# Patient Record
Sex: Male | Born: 1965 | Hispanic: Yes | Marital: Married
Health system: Southern US, Community
[De-identification: ages and names within clinical notes are randomized; demographics above are authoritative.]

## PROBLEM LIST (undated history)

## (undated) DIAGNOSIS — T884XXA Failed or difficult intubation, initial encounter: Secondary | ICD-10-CM

## (undated) DIAGNOSIS — I10 Essential (primary) hypertension: Secondary | ICD-10-CM

---

## 2015-01-08 ENCOUNTER — Inpatient Hospital Stay (HOSPITAL_COMMUNITY): Payer: Medicaid Other

## 2015-01-08 ENCOUNTER — Encounter (HOSPITAL_COMMUNITY): Payer: Self-pay

## 2015-01-08 ENCOUNTER — Inpatient Hospital Stay (HOSPITAL_COMMUNITY)
Admission: EM | Admit: 2015-01-08 | Discharge: 2015-01-24 | DRG: 246 | Disposition: A | Discharge status: Rehabilitation Facility | Payer: Medicaid Other | Attending: Internal Medicine | Admitting: Internal Medicine

## 2015-01-08 ENCOUNTER — Emergency Department (HOSPITAL_COMMUNITY): Payer: Medicaid Other

## 2015-01-08 ENCOUNTER — Encounter (HOSPITAL_COMMUNITY)
Admission: EM | Disposition: A | Discharge status: Rehabilitation Facility | Payer: Self-pay | Source: Home / Self Care | Attending: Pulmonary Disease

## 2015-01-08 DIAGNOSIS — R1310 Dysphagia, unspecified: Secondary | ICD-10-CM | POA: Diagnosis present

## 2015-01-08 DIAGNOSIS — D649 Anemia, unspecified: Secondary | ICD-10-CM | POA: Diagnosis present

## 2015-01-08 DIAGNOSIS — N189 Chronic kidney disease, unspecified: Secondary | ICD-10-CM | POA: Diagnosis present

## 2015-01-08 DIAGNOSIS — A419 Sepsis, unspecified organism: Secondary | ICD-10-CM | POA: Diagnosis not present

## 2015-01-08 DIAGNOSIS — J80 Acute respiratory distress syndrome: Secondary | ICD-10-CM | POA: Diagnosis not present

## 2015-01-08 DIAGNOSIS — G931 Anoxic brain damage, not elsewhere classified: Secondary | ICD-10-CM

## 2015-01-08 DIAGNOSIS — J9601 Acute respiratory failure with hypoxia: Secondary | ICD-10-CM

## 2015-01-08 DIAGNOSIS — E876 Hypokalemia: Secondary | ICD-10-CM

## 2015-01-08 DIAGNOSIS — J810 Acute pulmonary edema: Secondary | ICD-10-CM | POA: Diagnosis present

## 2015-01-08 DIAGNOSIS — Z4659 Encounter for fitting and adjustment of other gastrointestinal appliance and device: Secondary | ICD-10-CM

## 2015-01-08 DIAGNOSIS — G9340 Encephalopathy, unspecified: Secondary | ICD-10-CM | POA: Diagnosis present

## 2015-01-08 DIAGNOSIS — Z79899 Other long term (current) drug therapy: Secondary | ICD-10-CM

## 2015-01-08 DIAGNOSIS — R1313 Dysphagia, pharyngeal phase: Secondary | ICD-10-CM | POA: Diagnosis not present

## 2015-01-08 DIAGNOSIS — I472 Ventricular tachycardia, unspecified: Secondary | ICD-10-CM | POA: Insufficient documentation

## 2015-01-08 DIAGNOSIS — J9690 Respiratory failure, unspecified, unspecified whether with hypoxia or hypercapnia: Secondary | ICD-10-CM

## 2015-01-08 DIAGNOSIS — I129 Hypertensive chronic kidney disease with stage 1 through stage 4 chronic kidney disease, or unspecified chronic kidney disease: Secondary | ICD-10-CM | POA: Diagnosis present

## 2015-01-08 DIAGNOSIS — K7200 Acute and subacute hepatic failure without coma: Secondary | ICD-10-CM | POA: Diagnosis present

## 2015-01-08 DIAGNOSIS — Z8249 Family history of ischemic heart disease and other diseases of the circulatory system: Secondary | ICD-10-CM | POA: Diagnosis not present

## 2015-01-08 DIAGNOSIS — J189 Pneumonia, unspecified organism: Secondary | ICD-10-CM | POA: Diagnosis present

## 2015-01-08 DIAGNOSIS — I462 Cardiac arrest due to underlying cardiac condition: Secondary | ICD-10-CM | POA: Diagnosis present

## 2015-01-08 DIAGNOSIS — R570 Cardiogenic shock: Secondary | ICD-10-CM | POA: Diagnosis present

## 2015-01-08 DIAGNOSIS — N179 Acute kidney failure, unspecified: Secondary | ICD-10-CM | POA: Diagnosis not present

## 2015-01-08 DIAGNOSIS — R6521 Severe sepsis with septic shock: Secondary | ICD-10-CM | POA: Diagnosis not present

## 2015-01-08 DIAGNOSIS — K5900 Constipation, unspecified: Secondary | ICD-10-CM | POA: Diagnosis not present

## 2015-01-08 DIAGNOSIS — J15211 Pneumonia due to Methicillin susceptible Staphylococcus aureus: Secondary | ICD-10-CM | POA: Diagnosis not present

## 2015-01-08 DIAGNOSIS — I96 Gangrene, not elsewhere classified: Secondary | ICD-10-CM | POA: Diagnosis not present

## 2015-01-08 DIAGNOSIS — I5031 Acute diastolic (congestive) heart failure: Secondary | ICD-10-CM | POA: Diagnosis not present

## 2015-01-08 DIAGNOSIS — I469 Cardiac arrest, cause unspecified: Secondary | ICD-10-CM

## 2015-01-08 DIAGNOSIS — I10 Essential (primary) hypertension: Secondary | ICD-10-CM | POA: Diagnosis present

## 2015-01-08 DIAGNOSIS — I2510 Atherosclerotic heart disease of native coronary artery without angina pectoris: Secondary | ICD-10-CM

## 2015-01-08 DIAGNOSIS — Z01818 Encounter for other preprocedural examination: Secondary | ICD-10-CM

## 2015-01-08 DIAGNOSIS — I4901 Ventricular fibrillation: Secondary | ICD-10-CM | POA: Diagnosis not present

## 2015-01-08 DIAGNOSIS — F05 Delirium due to known physiological condition: Secondary | ICD-10-CM | POA: Diagnosis not present

## 2015-01-08 DIAGNOSIS — J1520 Pneumonia due to staphylococcus, unspecified: Secondary | ICD-10-CM | POA: Diagnosis present

## 2015-01-08 DIAGNOSIS — I2111 ST elevation (STEMI) myocardial infarction involving right coronary artery: Secondary | ICD-10-CM

## 2015-01-08 DIAGNOSIS — E870 Hyperosmolality and hypernatremia: Secondary | ICD-10-CM | POA: Diagnosis not present

## 2015-01-08 DIAGNOSIS — D696 Thrombocytopenia, unspecified: Secondary | ICD-10-CM | POA: Diagnosis present

## 2015-01-08 DIAGNOSIS — Z9289 Personal history of other medical treatment: Secondary | ICD-10-CM

## 2015-01-08 DIAGNOSIS — R739 Hyperglycemia, unspecified: Secondary | ICD-10-CM | POA: Diagnosis present

## 2015-01-08 DIAGNOSIS — Z978 Presence of other specified devices: Secondary | ICD-10-CM

## 2015-01-08 DIAGNOSIS — Z7982 Long term (current) use of aspirin: Secondary | ICD-10-CM

## 2015-01-08 HISTORY — DX: Essential (primary) hypertension: I10

## 2015-01-08 HISTORY — DX: Failed or difficult intubation, initial encounter: T88.4XXA

## 2015-01-08 HISTORY — PX: ELECTROPHYSIOLOGIC STUDY: SHX172A

## 2015-01-08 HISTORY — PX: CARDIAC CATHETERIZATION: SHX172

## 2015-01-08 LAB — BASIC METABOLIC PANEL
ANION GAP: 15 (ref 5–15)
ANION GAP: 11 (ref 5–15)
BUN: 16 mg/dL (ref 6–20)
BUN: 19 mg/dL (ref 6–20)
CALCIUM: 7.3 mg/dL — AB (ref 8.9–10.3)
CALCIUM: 7.5 mg/dL — AB (ref 8.9–10.3)
CHLORIDE: 111 mmol/L (ref 101–111)
CO2: 16 mmol/L — ABNORMAL LOW (ref 22–32)
CO2: 16 mmol/L — ABNORMAL LOW (ref 22–32)
CREATININE: 2.14 mg/dL — AB (ref 0.61–1.24)
Chloride: 106 mmol/L (ref 101–111)
Creatinine, Ser: 2.05 mg/dL — ABNORMAL HIGH (ref 0.61–1.24)
GFR calc Af Amer: 39 mL/min — ABNORMAL LOW (ref >60)
GFR calc non Af Amer: 36 mL/min — ABNORMAL LOW (ref >60)
GFR calc non Af Amer: 34 mL/min — ABNORMAL LOW (ref >60)
GFR, EST AFRICAN AMERICAN: 41 mL/min — AB (ref >60)
Glucose, Bld: 339 mg/dL — ABNORMAL HIGH (ref 65–99)
Glucose, Bld: 219 mg/dL — ABNORMAL HIGH (ref 65–99)
POTASSIUM: 4.3 mmol/L (ref 3.5–5.1)
Potassium: 3.8 mmol/L (ref 3.5–5.1)
SODIUM: 138 mmol/L (ref 135–145)
Sodium: 137 mmol/L (ref 135–145)

## 2015-01-08 LAB — COMPREHENSIVE METABOLIC PANEL
ALK PHOS: 70 U/L (ref 38–126)
ALT: 1428 U/L — ABNORMAL HIGH (ref 17–63)
AST: 848 U/L — ABNORMAL HIGH (ref 15–41)
Albumin: 3.8 g/dL (ref 3.5–5.0)
Anion gap: 24 — ABNORMAL HIGH (ref 5–15)
BUN: 15 mg/dL (ref 6–20)
CALCIUM: 8.7 mg/dL — AB (ref 8.9–10.3)
CHLORIDE: 105 mmol/L (ref 101–111)
CO2: 13 mmol/L — ABNORMAL LOW (ref 22–32)
Creatinine, Ser: 2.15 mg/dL — ABNORMAL HIGH (ref 0.61–1.24)
GFR calc Af Amer: 39 mL/min — ABNORMAL LOW (ref >60)
GFR calc non Af Amer: 34 mL/min — ABNORMAL LOW (ref >60)
GLUCOSE: 354 mg/dL — AB (ref 65–99)
POTASSIUM: 2.9 mmol/L — AB (ref 3.5–5.1)
SODIUM: 142 mmol/L (ref 135–145)
Total Bilirubin: 0.6 mg/dL (ref 0.3–1.2)
Total Protein: 6.2 g/dL — ABNORMAL LOW (ref 6.5–8.1)

## 2015-01-08 LAB — CBC
HEMATOCRIT: 43 % (ref 39.0–52.0)
HEMOGLOBIN: 13.7 g/dL (ref 13.0–17.0)
MCH: 28.1 pg (ref 26.0–34.0)
MCHC: 31.9 g/dL (ref 30.0–36.0)
MCV: 88.1 fL (ref 78.0–100.0)
Platelets: 231 10*3/uL (ref 150–400)
RBC: 4.88 MIL/uL (ref 4.22–5.81)
RDW: 13.6 % (ref 11.5–15.5)
WBC: 6.8 10*3/uL (ref 4.0–10.5)

## 2015-01-08 LAB — POCT I-STAT, CHEM 8
BUN: 19 mg/dL (ref 6–20)
BUN: 19 mg/dL (ref 6–20)
CALCIUM ION: 1.06 mmol/L — AB (ref 1.12–1.23)
CHLORIDE: 112 mmol/L — AB (ref 101–111)
Calcium, Ion: 1.1 mmol/L — ABNORMAL LOW (ref 1.12–1.23)
Chloride: 112 mmol/L — ABNORMAL HIGH (ref 101–111)
Creatinine, Ser: 1.7 mg/dL — ABNORMAL HIGH (ref 0.61–1.24)
Creatinine, Ser: 1.7 mg/dL — ABNORMAL HIGH (ref 0.61–1.24)
GLUCOSE: 253 mg/dL — AB (ref 65–99)
Glucose, Bld: 283 mg/dL — ABNORMAL HIGH (ref 65–99)
HCT: 43 % (ref 39.0–52.0)
HEMATOCRIT: 44 % (ref 39.0–52.0)
HEMOGLOBIN: 14.6 g/dL (ref 13.0–17.0)
Hemoglobin: 15 g/dL (ref 13.0–17.0)
Potassium: 4.5 mmol/L (ref 3.5–5.1)
Potassium: 4.5 mmol/L (ref 3.5–5.1)
SODIUM: 143 mmol/L (ref 135–145)
Sodium: 142 mmol/L (ref 135–145)
TCO2: 12 mmol/L (ref 0–100)
TCO2: 12 mmol/L (ref 0–100)

## 2015-01-08 LAB — PROTIME-INR
INR: 1.54 — AB (ref 0.00–1.49)
INR: >10 (ref 0.00–1.49)
PROTHROMBIN TIME: 18.5 s — AB (ref 11.6–15.2)

## 2015-01-08 LAB — APTT: aPTT: 55 seconds — ABNORMAL HIGH (ref 24–37)

## 2015-01-08 LAB — I-STAT TROPONIN, ED: TROPONIN I, POC: 0.42 ng/mL — AB (ref 0.00–0.08)

## 2015-01-08 LAB — GLUCOSE, CAPILLARY: Glucose-Capillary: 300 mg/dL — ABNORMAL HIGH (ref 65–99)

## 2015-01-08 LAB — LACTIC ACID, PLASMA: Lactic Acid, Venous: 9.3 mmol/L (ref 0.5–2.0)

## 2015-01-08 LAB — TROPONIN I: Troponin I: 13.65 ng/mL (ref <0.031)

## 2015-01-08 SURGERY — LEFT HEART CATH AND CORONARY ANGIOGRAPHY
Anesthesia: LOCAL

## 2015-01-08 MED ORDER — POTASSIUM CHLORIDE 20 MEQ/15ML (10%) PO SOLN
40.0000 meq | Freq: Once | ORAL | Status: AC
  Administered 2015-01-08: 40 meq
  Filled 2015-01-08: qty 30

## 2015-01-08 MED ORDER — BIVALIRUDIN 250 MG IV SOLR
0.2500 mg/kg/h | Freq: Once | INTRAVENOUS | Status: AC
  Administered 2015-01-08: 1.75 mg/kg/h via INTRAVENOUS
  Filled 2015-01-08: qty 250

## 2015-01-08 MED ORDER — SODIUM CHLORIDE 0.9 % IV SOLN
1.0000 ug/kg/min | INTRAVENOUS | Status: DC
  Administered 2015-01-08 – 2015-01-09 (×2): 1 ug/kg/min via INTRAVENOUS
  Filled 2015-01-08 (×2): qty 20

## 2015-01-08 MED ORDER — AMIODARONE HCL IN DEXTROSE 360-4.14 MG/200ML-% IV SOLN
60.0000 mg/h | INTRAVENOUS | Status: AC
  Administered 2015-01-08: 60 mg/h via INTRAVENOUS
  Filled 2015-01-08 (×2): qty 200

## 2015-01-08 MED ORDER — ASPIRIN EC 81 MG PO TBEC
81.0000 mg | DELAYED_RELEASE_TABLET | Freq: Every day | ORAL | Status: DC
  Administered 2015-01-09 – 2015-01-10 (×2): 81 mg via ORAL
  Filled 2015-01-08 (×2): qty 1

## 2015-01-08 MED ORDER — TIROFIBAN HCL IV 5 MG/100ML
INTRAVENOUS | Status: DC | PRN
  Administered 2015-01-08: 0.075 ug/kg/min via INTRAVENOUS

## 2015-01-08 MED ORDER — SODIUM CHLORIDE 0.9 % IV SOLN
25.0000 ug/h | INTRAVENOUS | Status: DC
  Administered 2015-01-08: 50 ug/h via INTRAVENOUS
  Administered 2015-01-09: 30 ug/h via INTRAVENOUS
  Administered 2015-01-09: 250 ug/h via INTRAVENOUS
  Administered 2015-01-10: 275 ug/h via INTRAVENOUS
  Filled 2015-01-08 (×5): qty 50

## 2015-01-08 MED ORDER — PROPOFOL 1000 MG/100ML IV EMUL
INTRAVENOUS | Status: AC
  Administered 2015-01-08: 20.83 mg
  Filled 2015-01-08: qty 100

## 2015-01-08 MED ORDER — SODIUM CHLORIDE 0.9 % IJ SOLN: 3.0000 mL | INTRAMUSCULAR | Status: DC | PRN

## 2015-01-08 MED ORDER — SODIUM CHLORIDE 0.9 % IV SOLN
INTRAVENOUS | Status: DC
  Administered 2015-01-08 (×2): via INTRAVENOUS

## 2015-01-08 MED ORDER — TIROFIBAN HCL IV 12.5 MG/250 ML
INTRAVENOUS | Status: AC
  Filled 2015-01-08: qty 250

## 2015-01-08 MED ORDER — METOPROLOL TARTRATE 1 MG/ML IV SOLN
INTRAVENOUS | Status: AC
  Filled 2015-01-08: qty 5

## 2015-01-08 MED ORDER — TICAGRELOR 90 MG PO TABS
90.0000 mg | ORAL_TABLET | Freq: Two times a day (BID) | ORAL | Status: DC
  Administered 2015-01-09 – 2015-01-10 (×3): 90 mg via ORAL
  Filled 2015-01-08 (×4): qty 1

## 2015-01-08 MED ORDER — SODIUM CHLORIDE 0.9 % IV SOLN
1.7500 mg/kg/h | INTRAVENOUS | Status: AC
  Administered 2015-01-08: 1.75 mg/kg/h via INTRAVENOUS

## 2015-01-08 MED ORDER — MIDAZOLAM HCL 5 MG/ML IJ SOLN
1.0000 mg/h | INTRAMUSCULAR | Status: DC
  Administered 2015-01-09 – 2015-01-10 (×4): 6 mg/h via INTRAVENOUS
  Filled 2015-01-08 (×6): qty 10

## 2015-01-08 MED ORDER — CISATRACURIUM BOLUS VIA INFUSION
0.1000 mg/kg | Freq: Once | INTRAVENOUS | Status: AC
  Administered 2015-01-08: 8 mg via INTRAVENOUS
  Filled 2015-01-08: qty 8

## 2015-01-08 MED ORDER — PHENYLEPHRINE HCL 10 MG/ML IJ SOLN
30.0000 ug/min | INTRAVENOUS | Status: DC
  Administered 2015-01-08: 30 ug/min via INTRAVENOUS
  Filled 2015-01-08: qty 4

## 2015-01-08 MED ORDER — IOHEXOL 350 MG/ML SOLN
INTRAVENOUS | Status: DC | PRN
  Administered 2015-01-08: 325 mL via INTRAVENOUS

## 2015-01-08 MED ORDER — SODIUM CHLORIDE 0.9 % IV SOLN
25.0000 ug/h | INTRAVENOUS | Status: DC
  Administered 2015-01-08: 50 ug/h via INTRAVENOUS
  Filled 2015-01-08: qty 50

## 2015-01-08 MED ORDER — NOREPINEPHRINE BITARTRATE 1 MG/ML IV SOLN
0.0000 ug/min | INTRAVENOUS | Status: DC
  Filled 2015-01-08: qty 4

## 2015-01-08 MED ORDER — SODIUM CHLORIDE 0.9 % IV SOLN
1.0000 mg/h | INTRAVENOUS | Status: DC
  Administered 2015-01-08: 3 mg/h via INTRAVENOUS
  Filled 2015-01-08: qty 10

## 2015-01-08 MED ORDER — CANGRELOR BOLUS VIA INFUSION
INTRAVENOUS | Status: DC | PRN
  Administered 2015-01-08: 2400 ug via INTRAVENOUS

## 2015-01-08 MED ORDER — FENTANYL CITRATE (PF) 100 MCG/2ML IJ SOLN: 100.0000 ug | Freq: Once | INTRAMUSCULAR | Status: AC

## 2015-01-08 MED ORDER — CANGRELOR TETRASODIUM 50 MG IV SOLR
INTRAVENOUS | Status: DC | PRN
  Administered 2015-01-08 (×2): 4 via INTRAVENOUS

## 2015-01-08 MED ORDER — SODIUM CHLORIDE 0.9 % IJ SOLN
3.0000 mL | Freq: Two times a day (BID) | INTRAMUSCULAR | Status: DC
  Administered 2015-01-08 – 2015-01-15 (×14): 3 mL via INTRAVENOUS

## 2015-01-08 MED ORDER — FENTANYL BOLUS VIA INFUSION
50.0000 ug | INTRAVENOUS | Status: DC | PRN
  Filled 2015-01-08: qty 50

## 2015-01-08 MED ORDER — METOPROLOL TARTRATE 1 MG/ML IV SOLN
INTRAVENOUS | Status: DC | PRN
  Administered 2015-01-08: 2.5 mg via INTRAVENOUS

## 2015-01-08 MED ORDER — ONDANSETRON HCL 4 MG/2ML IJ SOLN
4.0000 mg | Freq: Four times a day (QID) | INTRAMUSCULAR | Status: DC | PRN
  Administered 2015-01-19: 4 mg via INTRAVENOUS

## 2015-01-08 MED ORDER — LIDOCAINE HCL (PF) 1 % IJ SOLN
INTRAMUSCULAR | Status: AC
  Filled 2015-01-08: qty 30

## 2015-01-08 MED ORDER — ETOMIDATE 2 MG/ML IV SOLN: 0.3000 mg/kg | Freq: Once | INTRAVENOUS | Status: DC

## 2015-01-08 MED ORDER — CANGRELOR TETRASODIUM 50 MG IV SOLR
INTRAVENOUS | Status: AC
  Filled 2015-01-08: qty 50

## 2015-01-08 MED ORDER — PROPOFOL 1000 MG/100ML IV EMUL
5.0000 ug/kg/min | Freq: Once | INTRAVENOUS | Status: AC
  Administered 2015-01-08: 20.833 ug/kg/min via INTRAVENOUS

## 2015-01-08 MED ORDER — BIVALIRUDIN BOLUS VIA INFUSION - CUPID
INTRAVENOUS | Status: DC | PRN
  Administered 2015-01-08: 60 mg via INTRAVENOUS

## 2015-01-08 MED ORDER — SODIUM CHLORIDE 0.9 % IV SOLN
2000.0000 mL | Freq: Once | INTRAVENOUS | Status: AC
Start: 2015-01-08 — End: 2015-01-08

## 2015-01-08 MED ORDER — ETOMIDATE 2 MG/ML IV SOLN
20.0000 mg | Freq: Once | INTRAVENOUS | Status: AC
  Administered 2015-01-08: 20 mg via INTRAVENOUS

## 2015-01-08 MED ORDER — SODIUM CHLORIDE 0.9 % IV SOLN
250.0000 mL | INTRAVENOUS | Status: DC | PRN
  Administered 2015-01-09: 250 mL via INTRAVENOUS

## 2015-01-08 MED ORDER — BIVALIRUDIN 250 MG IV SOLR
250.0000 mg | INTRAVENOUS | Status: DC | PRN
  Administered 2015-01-08: 1.75 mg/kg/h via INTRAVENOUS

## 2015-01-08 MED ORDER — AMIODARONE HCL IN DEXTROSE 360-4.14 MG/200ML-% IV SOLN
60.0000 mg/h | Freq: Once | INTRAVENOUS | Status: AC
  Administered 2015-01-08: 60 mg/h via INTRAVENOUS
  Filled 2015-01-08: qty 200

## 2015-01-08 MED ORDER — ATORVASTATIN CALCIUM 80 MG PO TABS
80.0000 mg | ORAL_TABLET | Freq: Every day | ORAL | Status: DC
  Filled 2015-01-08: qty 1

## 2015-01-08 MED ORDER — ACETAMINOPHEN 325 MG PO TABS
650.0000 mg | ORAL_TABLET | ORAL | Status: DC | PRN
  Administered 2015-01-09 – 2015-01-10 (×3): 650 mg via ORAL
  Filled 2015-01-08 (×3): qty 2

## 2015-01-08 MED ORDER — TIROFIBAN HCL IV 5 MG/100ML
0.0750 ug/kg/min | INTRAVENOUS | Status: DC
  Administered 2015-01-08: 0.075 ug/kg/min via INTRAVENOUS
  Filled 2015-01-08 (×2): qty 100

## 2015-01-08 MED ORDER — SODIUM CHLORIDE 0.9 % IV SOLN: INTRAVENOUS | Status: AC

## 2015-01-08 MED ORDER — MIDAZOLAM HCL 2 MG/2ML IJ SOLN: 2.0000 mg | Freq: Once | INTRAMUSCULAR | Status: AC

## 2015-01-08 MED ORDER — AMIODARONE HCL 150 MG/3ML IV SOLN
INTRAVENOUS | Status: DC | PRN
  Administered 2015-01-08: 300 mg via INTRAVENOUS

## 2015-01-08 MED ORDER — SODIUM CHLORIDE 0.9 % IV SOLN
1.0000 ug/kg/min | INTRAVENOUS | Status: DC
  Administered 2015-01-08: 1 ug/kg/min via INTRAVENOUS
  Filled 2015-01-08: qty 20

## 2015-01-08 MED ORDER — TICAGRELOR 90 MG PO TABS
ORAL_TABLET | ORAL | Status: AC
  Filled 2015-01-08: qty 2

## 2015-01-08 MED ORDER — BIVALIRUDIN 250 MG IV SOLR
INTRAVENOUS | Status: AC
  Filled 2015-01-08: qty 250

## 2015-01-08 MED ORDER — AMIODARONE HCL IN DEXTROSE 360-4.14 MG/200ML-% IV SOLN
30.0000 mg/h | INTRAVENOUS | Status: DC
  Administered 2015-01-08 – 2015-01-10 (×5): 30 mg/h via INTRAVENOUS
  Filled 2015-01-08 (×13): qty 200

## 2015-01-08 MED ORDER — MIDAZOLAM BOLUS VIA INFUSION
2.0000 mg | INTRAVENOUS | Status: DC | PRN
  Filled 2015-01-08: qty 2

## 2015-01-08 MED ORDER — PHENYLEPHRINE HCL 10 MG/ML IJ SOLN
30.0000 ug/min | INTRAVENOUS | Status: DC
  Filled 2015-01-08: qty 1

## 2015-01-08 MED ORDER — CISATRACURIUM BOLUS VIA INFUSION
0.0500 mg/kg | INTRAVENOUS | Status: DC | PRN
  Filled 2015-01-08: qty 4

## 2015-01-08 MED ORDER — SODIUM CHLORIDE 0.9 % IV SOLN
1.7500 mg/kg/h | INTRAVENOUS | Status: DC
  Filled 2015-01-08: qty 250

## 2015-01-08 MED ORDER — INSULIN REGULAR HUMAN 100 UNIT/ML IJ SOLN
INTRAMUSCULAR | Status: DC
  Administered 2015-01-08: 2.1 [IU]/h via INTRAVENOUS
  Administered 2015-01-09: 1.7 [IU]/h via INTRAVENOUS
  Administered 2015-01-09: 1.8 [IU]/h via INTRAVENOUS
  Filled 2015-01-08: qty 2.5

## 2015-01-08 MED ORDER — TICAGRELOR 90 MG PO TABS
ORAL_TABLET | ORAL | Status: DC | PRN
Start: 2015-01-08 — End: 2015-01-08
  Administered 2015-01-08: 180 mg via NASOGASTRIC

## 2015-01-08 MED ORDER — SUCCINYLCHOLINE CHLORIDE 20 MG/ML IJ SOLN
100.0000 mg | Freq: Once | INTRAMUSCULAR | Status: AC
  Administered 2015-01-08: 100 mg via INTRAVENOUS

## 2015-01-08 MED ORDER — SODIUM CHLORIDE 0.9 % IV SOLN
2000.0000 mL | Freq: Once | INTRAVENOUS | Status: AC
  Administered 2015-01-08: 2000 mL via INTRAVENOUS

## 2015-01-08 MED ORDER — DEXTROSE 5 % IV SOLN
0.0000 ug/min | INTRAVENOUS | Status: DC
  Administered 2015-01-09: 15 ug/min via INTRAVENOUS
  Administered 2015-01-09: 12 ug/min via INTRAVENOUS
  Filled 2015-01-08 (×3): qty 16

## 2015-01-08 MED ORDER — ARTIFICIAL TEARS OP OINT
1.0000 "application " | TOPICAL_OINTMENT | Freq: Three times a day (TID) | OPHTHALMIC | Status: DC
  Administered 2015-01-08 – 2015-01-10 (×5): 1 via OPHTHALMIC
  Filled 2015-01-08: qty 3.5

## 2015-01-08 MED ORDER — SODIUM CHLORIDE 0.9 % IV SOLN: INTRAVENOUS | Status: DC | PRN

## 2015-01-08 MED ORDER — HEPARIN (PORCINE) IN NACL 2-0.9 UNIT/ML-% IJ SOLN
INTRAMUSCULAR | Status: AC
  Filled 2015-01-08: qty 1500

## 2015-01-08 MED ORDER — ASPIRIN 300 MG RE SUPP
300.0000 mg | RECTAL | Status: AC
  Administered 2015-01-08: 300 mg via RECTAL
  Filled 2015-01-08: qty 1

## 2015-01-08 MED ORDER — TIROFIBAN HCL IV 5 MG/100ML
0.0750 ug/kg/min | INTRAVENOUS | Status: DC
  Filled 2015-01-08 (×2): qty 100

## 2015-01-08 SURGICAL SUPPLY — 26 items
BALLN EUPHORA RX 2.0X10 (BALLOONS) ×3
BALLN EUPHORA RX 3.0X12 (BALLOONS) ×3
BALLN EUPHORA RX 3.5X20 (BALLOONS) ×3
BALLN ~~LOC~~ EUPHORA RX 4.0X15 (BALLOONS) ×3
BALLOON EUPHORA RX 2.0X10 (BALLOONS) ×2 IMPLANT
BALLOON EUPHORA RX 3.0X12 (BALLOONS) ×2 IMPLANT
BALLOON EUPHORA RX 3.5X20 (BALLOONS) ×2 IMPLANT
BALLOON ~~LOC~~ EUPHORA RX 4.0X15 (BALLOONS) ×2 IMPLANT
CATH INFINITI 5FR ANG PIGTAIL (CATHETERS) IMPLANT
CATH INFINITI 5FR MULTPACK ANG (CATHETERS) ×3 IMPLANT
CATH OPTITORQUE TIG 4.0 5F (CATHETERS) IMPLANT
CATH VISTA GUIDE 6FR JR4 (CATHETERS) ×3 IMPLANT
DEVICE RAD COMP TR BAND LRG (VASCULAR PRODUCTS) ×3 IMPLANT
GLIDESHEATH SLEND A-KIT 6F 22G (SHEATH) IMPLANT
KIT ENCORE 26 ADVANTAGE (KITS) ×3 IMPLANT
KIT HEART LEFT (KITS) ×3 IMPLANT
PACK CARDIAC CATHETERIZATION (CUSTOM PROCEDURE TRAY) ×3 IMPLANT
SHEATH PINNACLE 5F 10CM (SHEATH) IMPLANT
SHEATH PINNACLE 6F 10CM (SHEATH) ×3 IMPLANT
STENT RESOLUTE INTEG 3.5X30 (Permanent Stent) ×3 IMPLANT
SYR MEDRAD MARK V 150ML (SYRINGE) ×3 IMPLANT
TRANSDUCER W/STOPCOCK (MISCELLANEOUS) ×3 IMPLANT
TUBING CIL FLEX 10 FLL-RA (TUBING) ×3 IMPLANT
WIRE ASAHI MEDIUM 180CM (WIRE) ×3 IMPLANT
WIRE EMERALD 3MM-J .035X150CM (WIRE) ×3 IMPLANT
WIRE SAFE-T 1.5MM-J .035X260CM (WIRE) IMPLANT

## 2015-01-08 NOTE — Progress Notes (Signed)
RT attempted 3x to get a radial arterial line inserted and were unsuccessful. Per Dr. Zachery ConchFriedman, ok to leave femoral sheath in for now for hemodynamic monitoring. Will continue to monitor.

## 2015-01-08 NOTE — Progress Notes (Addendum)
MEDICATION RELATED NOTE   Pharmacy Re: Angiomax  PT/INR = 90/> 10 APTT = > 200    These are falsely elevated due to the Angiomax and are not reflected of his actual bleeding time.  I have asked the nurse to obtain an ACT.  Would need to determine risk/benefit and level of ACT to place central line.  Plan:  - Consider using ACT for monitoring until Angiomax is completed - F/U ACT to assist with therapy  Nadara MustardNita Sharyn Brilliant, PharmD., MS Clinical Pharmacist Pager:  [phone] Thank you for allowing pharmacy to be part of this patients care team. 01/08/2015,9:56 PM  Addendum: Nurse reports current ACT = 171

## 2015-01-08 NOTE — Progress Notes (Signed)
eLink Physician-Brief Progress Note Patient Name: Alexander Berry DOB: 02/10/1961 MRN: [id number]   Date of Service  01/08/2015  HPI/Events of Note  Spoke with pharmacist about PT/INR with patient being on angiomax; this will cause false elevation on the PT\INR  eICU Interventions  Follow up other labs.      Intervention Category Intermediate Interventions: Other:  Adryanna Friedt 01/08/2015, 10:20 PM

## 2015-01-08 NOTE — Progress Notes (Signed)
°   01/08/15 1512  Clinical Encounter Type  Visited With Family;Patient not available;Health care provider  Visit Type Code;Trauma  Referral From Nurse  Spiritual Encounters  Spiritual Needs Emotional  Stress Factors  Patient Stress Factors None identified  Family Stress Factors Health changes;Lack of knowledge   Responded to code Stemi while in ED. Patient was playing soccer and collapsed. Patient had a pulse when he arrived. Patients son was with him in the ambulance. Patients wife arrived shortly after. Escorted the wife and son to consult room A and left the remainder of people in the ED per wife's request. Staff working with patient. Will update family periodically.  Follow-up with family when updates are available.  Lorna FewChristina Yarbrough, Contract Chaplain 01/08/2015, 3:29 PM

## 2015-01-08 NOTE — Progress Notes (Signed)
MEDICATION RELATED CONSULT NOTE - INITIAL   Pharmacy Consult for Tirofiban Indication: STEMI  Allergies not on file  Patient Measurements: Height: 5\' 7"  (170.2 cm) Weight: 220 lb 7.4 oz (100 kg) IBW/kg (Calculated) : 66.1 Adjusted Body Weight: 79.7kg  Vital Signs: Temp: 98.2 F (36.8 C) (06/05 1900) Temp Source: Core (Comment) (06/05 1900) BP: 134/101 mmHg (06/05 1830) Pulse Rate: 84 (06/05 1830)  Labs:  Recent Labs  01/08/15 1515  WBC 6.8  HGB 13.7  HCT 43.0  PLT 231  APTT 55*  CREATININE 2.15*  ALBUMIN 3.8  PROT 6.2*  AST 848*  ALT 1428*  ALKPHOS 70  BILITOT 0.6   Estimated Creatinine Clearance: 45.3 mL/min (by C-G formula based on Cr of 2.15).  Medical History: Past Medical History  Diagnosis Date  . Hypertension    Medications:  Infusions:  . sodium chloride 20 mL/hr at 01/08/15 1754  . sodium chloride    . amiodarone 60 mg/hr (01/08/15 1755)  . amiodarone    . cisatracurium (NIMBEX) infusion    . fentaNYL infusion INTRAVENOUS    . midazolam (VERSED) infusion    . norepinephrine (LEVOPHED) Adult infusion    . phenylephrine (NEO-SYNEPHRINE) Adult infusion Stopped (01/08/15 1844)  . tirofiban     Assessment: 49 yo male with out of hospital arrest, shocked and [facility].  He is 67 inches and has a weight of 100 kg.  Code STEMI was called and he was placed on the hypothermia protocol.  He was given Cangrelor, Brilinta, Angiomax, Amiodarone bolus and Angiomax in the cath lab.  He was placed on IV Tirofiban for heavy clot burden found during procedure.  Plan is for this to continue x 18 hours post case.   Baseline Labs:  Creatinine 2.15 (unclear if this is due to arrest or if he may have some renal insufficiency), H/H 13.7/43.0 and a platelet count of 231k.  His PT/INR was elevated upon admit at 18.5/1.54 and his aPTT is 55 sec.  His AST/ALT is significantly elevated and a serum glucose of 354.   Plan:  - Continue Tirofiban at  reduced rate of 0.06275mcg/kg/min for renal adjustment - Monitor CBC and platelets - F/U for s/s of bleeding - F/U for anticoagulation needs  Nadara MustardNita Aithan Farrelly, PharmD., MS Clinical Pharmacist Pager:  [phone] Thank you for allowing pharmacy to be part of this patients care team. 01/08/2015,7:08 PM

## 2015-01-08 NOTE — ED Provider Notes (Signed)
CSN: [id number]     Arrival date & time 01/08/15  1507 History   First MD Initiated Contact with Patient 01/08/15 1523     Chief Complaint  Patient presents with  . Code STEMI     (Consider location/radiation/quality/duration/timing/severity/associated sxs/prior Treatment) HPI 49 year old male who had a cardiac arrest on the soccer field. EMR S reports that he was in V. fib on their arrival. He received 8 shocks and then had a return of circulation with good pulses. Patient is breathing over the vent. He had an EKG done by EMS and appears to be having ST elevation MI. History reviewed. No pertinent past medical history. History reviewed. No pertinent past surgical history. No family history on file. History  Substance Use Topics  . Smoking status: Unknown If Ever Smoked  . Smokeless tobacco: Not on file  . Alcohol Use: Not on file    Review of Systems  Unable to perform ROS     Allergies  Review of patient's allergies indicates not on file.  Home Medications   Prior to Admission medications   Not on File   BP 158/109 mmHg  Pulse 112  Resp 27  Ht 5\' 7"  (1.702 m)  Wt 176 lb 5.9 oz (80 kg)  BMI 27.62 kg/m2  SpO2 100% Physical Exam  Constitutional: He appears well-developed and well-nourished.  HENT:  Head: Normocephalic.  Eyes: Pupils are equal, round, and reactive to light.  Neck: Normal range of motion.  Cardiovascular: Tachycardia present.   Pulmonary/Chest:  King airway in place with good bilateral breath sounds oxygen saturation is 96  Abdominal: Soft. Bowel sounds are normal.  Musculoskeletal: He exhibits no edema.  Neurological:  Patient is not doing any purposeful movement but does appear to be breathing over the vent some.  Nursing note and vitals reviewed.   ED Course  Procedures (including critical care time) Labs Review Labs Reviewed  APTT  CBC  COMPREHENSIVE METABOLIC PANEL  PROTIME-INR  I-STAT TROPOININ, ED    Imaging Review No results  found.   EKG Interpretation   Date/Time:  Sunday January 08 2015 15:10:37 EDT Ventricular Rate:  113 PR Interval:  78 QRS Duration: 192 QT Interval:  450 QTC Calculation: 617 R Axis:   99 Text Interpretation:  Undetermined rhythm st elevation inferior and  lateral leads ** ** ACUTE MI / STEMI ** ** Confirmed by Evone Arseneau MD, Duwayne HeckANIELLE  ([id number]) on 01/08/2015 3:30:47 PM      MDM   Final diagnoses:  None   patient seen and evaluated with Dr. Eudelia Bunchardama  please see his note for intubation. Patient is being cooled. Code STEMI called and I spoke with Dr. Tresa EndoKelly. He is in route to take the patient to the Cath Lab.  Margarita Grizzleanielle Gracey Tolle, MD 01/08/15 [phone]

## 2015-01-08 NOTE — ED Notes (Signed)
Amiodarone 150 mg bolus given through left hand IV

## 2015-01-08 NOTE — ED Provider Notes (Signed)
  INTUBATION Performed by: Drema Pryardama, Alysha Doolan  Required items: required blood products, implants, devices, and special equipment available Patient identity confirmed: provided demographic data and hospital-assigned identification number Time out: Immediately prior to procedure a "time out" was called to verify the correct patient, procedure, equipment, support staff and site/side marked as required.  Indications: cardiac arrest and airway protection  Intubation method: Glidescope Laryngoscopy   Preoxygenation: BVM  Sedatives: Etomidate Paralytic: Succinylcholine  Tube Size: 7.5 cuffed  Post-procedure assessment: chest rise and ETCO2 monitor Breath sounds: equal and absent over the epigastrium Tube secured with: ETT holder Chest x-ray interpreted by radiologist and me.  Chest x-ray findings: endotracheal tube in appropriate position  Patient tolerated the procedure well with no immediate complications.     Drema PryPedro Heaven Wandell, MD 01/09/15 [id number]  Margarita Grizzleanielle Ray, MD 01/14/15 [id number]

## 2015-01-08 NOTE — Procedures (Signed)
Central Venous Catheter Insertion Procedure Note Alexander Berry [id number] 02/10/1961  Procedure: Insertion of Central Venous Catheter Indications: Assessment of intravascular volume, Drug and/or fluid administration and Frequent blood sampling  Procedure Details Consent: Risks of procedure as well as the alternatives and risks of each were explained to the (patient/caregiver).  Consent for procedure obtained. Time Out: Verified patient identification, verified procedure, site/side was marked, verified correct patient position, special equipment/implants available, medications/allergies/relevent history reviewed, required imaging and test results available.  Performed  Maximum sterile technique was used including antiseptics, cap, gloves, gown, hand hygiene, mask and sheet. Skin prep: Chlorhexidine; local anesthetic administered A antimicrobial bonded/coated triple lumen catheter was placed in the right internal jugular vein using the Seldinger technique. Ultrasound guidance used.Yes.   Catheter placed to 16 cm. Blood aspirated via all 3 ports and then flushed x 3. Line sutured x 2 and dressing applied.  Evaluation Blood flow good Complications: No apparent complications Patient did tolerate procedure well. Chest X-ray ordered to verify placement.  CXR: pending.  Joneen RoachPaul Hoffman, AGACNP-BC [place] [facility] Pager [phone] or [phone]  01/08/2015 11:18 PM   Alexander Fischeravid Ramzey Petrovic, MD ; [facility] Mobile [phone].  After 5:30 PM or weekends, call [phone]

## 2015-01-08 NOTE — H&P (Signed)
Patient ID: Alexander Berry MRN: [id number], DOB/AGE: 48/04/1961   Admit date: 01/08/2015   Primary Physician: No PCP Per Patient Primary Cardiologist: New (Dr. Tresa EndoKelly)  Pt. Profile:  49 y/o male with h/o HTN but no other known risk factors admitted after witnessed out of hospital cardiac arrest.   Problem List  Past Medical History  Diagnosis Date  . Hypertension     History reviewed. No pertinent past surgical history.   Allergies  Allergies not on file  HPI  The patient is a 49 y/o male with no prior cardiac history. [facility] by EMS as a wittenssed out-of hospital cardiac arrest. He was playing soccer when he had sudden syncope and collapse. Bystander CPR was preformed. On EMS arrival, he was in Vfib. CPR was continued. He was given Epi x 9 and Amiodarone x 2. He was intubated in the field. EKG now shows global ST elevations. He has been taken urgently to the cath lab.   His family reports he has a h/o HTN but he is not on any medications. No history of tobacco use. No known history of DM. No family h/o CAD or SCD. His father had HTN. His wife reports that he did complain of chest discomfort early this am. No reports of any other symptoms.   Home Medications  Prior to Admission medications   Not on File    Family History  Problem Relation Age of Onset  . Hypertension Father     Social History  History   Social History  . Marital Status: Married    Spouse Name: N/A  . Number of Children: N/A  . Years of Education: N/A   Occupational History  . Not on file.   Social History Main Topics  . Smoking status: Unknown If Ever Smoked  . Smokeless tobacco: Not on file  . Alcohol Use: Not on file  . Drug Use: Not on file  . Sexual Activity: Not on file   Other Topics Concern  . Not on file   Social History Narrative  . No narrative on file     Review of Systems: UNABLE to ASSESS General:  No chills, fever, night sweats or weight  changes.  Cardiovascular:  No chest pain, dyspnea on exertion, edema, orthopnea, palpitations, paroxysmal nocturnal dyspnea. Dermatological: No rash, lesions/masses Respiratory: No cough, dyspnea Urologic: No hematuria, dysuria Abdominal:   No nausea, vomiting, diarrhea, bright red blood per rectum, melena, or hematemesis Neurologic:  No visual changes, wkns, changes in mental status. All other systems reviewed and are otherwise negative except as noted above.  Physical Exam  Blood pressure 193/119, pulse 104, resp. rate 20, height 5\' 7"  (1.702 m), weight 176 lb 5.9 oz (80 kg), SpO2 98 %.  General: Intubated Psych: Intubated Neuro: Intubated  HEENT: no visible head truama Neck: Supple without bruits or JVD. Lungs:  Intubated. Course BS. Heart: tachy rate Abdomen: Soft, non-distended, BS + x 4.  Extremities: No clubbing, cyanosis or edema. DP/PT/Radials 2+ and equal bilaterally.  Labs  Troponin [facility](Point of Care Test)  Recent Labs  01/08/15 1521  TROPIPOC 0.42*   No results for input(s): CKTOTAL, CKMB, TROPONINI in the last 72 hours. Lab Results  Component Value Date   WBC 6.8 01/08/2015   HGB 13.7 01/08/2015   HCT 43.0 01/08/2015   MCV 88.1 01/08/2015   PLT 231 01/08/2015   No results for input(s): NA, K, CL, CO2, BUN, CREATININE, CALCIUM, PROT, BILITOT, ALKPHOS, ALT, AST,  GLUCOSE in the last 168 hours.  Invalid input(s): LABALBU No results found for: CHOL, HDL, LDLCALC, TRIG No results found for: DDIMER   Radiology/Studies  Dg Chest Port 1 View  01/08/2015   CLINICAL DATA:  Cardiopulmonary arrest playing soccer. Initial encounter.  EXAM: PORTABLE CHEST - 1 VIEW  COMPARISON:  None.  FINDINGS: 1532 hours. Endotracheal tube tip is in the midtrachea, 3.0 cm above the carina. A nasogastric tube extends into the stomach, tip not visualized. No definite central venous catheter identified. There are numerous telemetry leads overlying the chest. There are low lung volumes with  left-greater-than-right perihilar airspace opacities. No pleural effusion or pneumothorax identified. The heart size mediastinal contours are normal allowing for mild patient rotation. No rib fractures identified.  IMPRESSION: Endotracheal and nasogastric tubes appears satisfactorily positioned. Left-greater-than-right perihilar pulmonary opacities may reflect edema or aspiration. No evidence of rib fracture or pneumothorax.   Electronically Signed   By: Carey Bullocks M.D.   On: 01/08/2015 15:44    ECG  Global ST elevations   ASSESSMENT AND PLAN  1. Cardiac Arrest: Urgent Cardiac Catheterization.    Signed, Robbie Lis, PA-C 01/08/2015, 4:07 PM  Patient seen and examined. Agree with assessment and plan. Mr Alexander Berry is a 49 year old Hispanic male who was playing soccer [employer] in a mens over 50 league.  Early this morning prior to playing soccer.  He had noticed some mild chest discomfort.  While playing soccer, he had an VF arrest.  CPR was instituted.  He was intubated.  He was given epinephrine 9 and amiodarone.  In the emergency room, he had marked inferior lateral ST elevation.  Suspect probable RCA occlusion.  He is now brought to the catheterization laboratory acutely for emergent cardiac catheterization and probable intervention.   Lennette Bihari, MD, [facility] 01/08/2015 5:35 PM

## 2015-01-08 NOTE — Consult Note (Signed)
PULMONARY / CRITICAL CARE MEDICINE   Name: Alexander Berry MRN: [id number] DOB: 02/10/1961    ADMISSION DATE:  01/08/2015 CONSULTATION DATE:  01/08/2015   REFERRING MD :  DR Norwood Levoom Kelley  CHIEF COMPLAINT:  Cardiac arrest V Fib  INITIAL PRESENTATION: Cardiac arrest V Fib. - CAD  SIGNIFICANT EVENTS: 01/08/2015 - admit. Start induced hypothermia - post cath lab   HISTORY OF PRESENT ILLNESS:   49 yo male with hx HTN with witnessed cardiac arrest 6/5 on soccer field. Initial rhythm VFib. Shocked x 8 with significant ST elevation on field. Code STEMI called on arrival, intubated and taken emergently to cath lab. Arrival at [facility] 3pm 01/08/2015 but unclear down time. And bystander CPR story. PCCM consulted post cath for hypothermia protocol.  At time off CCM md eval patient is s/p DES to RCA and balloon angioplasty to distal vessel x 2. He is not on pressors but deeply sedated and kept cold with ice pack. He is currently on angiomax, aggrastat, amio, fent and versed gtt.   PAST MEDICAL HISTORY :   has a past medical history of Hypertension.  has no past surgical history on file. Prior to Admission medications   Not on File   Allergies not on file  FAMILY HISTORY:  has no family status information on file.  SOCIAL HISTORY:    REVIEW OF SYSTEMS:  unelcitiable    VITAL SIGNS: Temp:  [97.5 F (36.4 C)-97.9 F (36.6 C)] 97.9 F (36.6 C) (06/05 1800) Pulse Rate:  [0-281] 101 (06/05 1735) Resp:  [0-90] 18 (06/05 1800) BP: (83-193)/(46-119) 107/72 mmHg (06/05 1800) SpO2:  [0 %-100 %] 100 % (06/05 1735) Arterial Line BP: (103-106)/(67-70) 106/70 mmHg (06/05 1800) FiO2 (%):  [100 %] 100 % (06/05 1735) Weight:  [80 kg (176 lb 5.9 oz)] 80 kg (176 lb 5.9 oz) (06/05 1510) HEMODYNAMICS:   VENTILATOR SETTINGS: Vent Mode:  [-] PRVC FiO2 (%):  [100 %] 100 % Set Rate:  [16 bmp] 16 bmp Vt Set:  [620 mL] 620 mL PEEP:  [5 cmH20] 5 cmH20 Plateau Pressure:  [20 cmH20] 20 cmH20 INTAKE /  OUTPUT:  Intake/Output Summary (Last 24 hours) at 01/08/15 1821 Last data filed at 01/08/15 1800  Gross per 24 hour  Intake  106.5 ml  Output    350 ml  Net -243.5 ml    PHYSICAL EXAMINATION: General: Looks criticall ill in ICU bed.  Neuro:  Deeply sedated. RASS -5 HEENT:  INtubated Cardiovascular:  RRR +. No murmurs Lungs:  CTA bilaterally. Sync with vent Abdomen:  Soft, no mass Musculoskeletal:  RT groin sheath +  Skin:  Intact anteriorly in exposed areas  LABS:  PULMONARY No results for input(s): PHART, PCO2ART, PO2ART, HCO3, TCO2, O2SAT in the last 168 hours.  Invalid input(s): PCO2, PO2  CBC  Recent Labs Lab 01/08/15 1515  HGB 13.7  HCT 43.0  WBC 6.8  PLT 231    COAGULATION  Recent Labs Lab 01/08/15 1515  INR 1.54*    CARDIAC  No results for input(s): TROPONINI in the last 168 hours. No results for input(s): PROBNP in the last 168 hours.   CHEMISTRY  Recent Labs Lab 01/08/15 1515  NA 142  K 2.9*  CL 105  CO2 13*  GLUCOSE 354*  BUN 15  CREATININE 2.15*  CALCIUM 8.7*   Estimated Creatinine Clearance: 40.8 mL/min (by C-G formula based on Cr of 2.15).   LIVER  Recent Labs Lab 01/08/15 1515  AST 848*  ALT 1428*  ALKPHOS 70  BILITOT 0.6  PROT 6.2*  ALBUMIN 3.8  INR 1.54*     INFECTIOUS No results for input(s): LATICACIDVEN, PROCALCITON in the last 168 hours.   ENDOCRINE CBG (last 3)  No results for input(s): GLUCAP in the last 72 hours.       IMAGING x48h  - image(s) personally visualized  -   highlighted in bold Dg Chest Port 1 View  01/08/2015   CLINICAL DATA:  Cardiopulmonary arrest playing soccer. Initial encounter.  EXAM: PORTABLE CHEST - 1 VIEW  COMPARISON:  None.  FINDINGS: 1532 hours. Endotracheal tube tip is in the midtrachea, 3.0 cm above the carina. A nasogastric tube extends into the stomach, tip not visualized. No definite central venous catheter identified. There are numerous telemetry leads overlying the  chest. There are low lung volumes with left-greater-than-right perihilar airspace opacities. No pleural effusion or pneumothorax identified. The heart size mediastinal contours are normal allowing for mild patient rotation. No rib fractures identified.  IMPRESSION: Endotracheal and nasogastric tubes appears satisfactorily positioned. Left-greater-than-right perihilar pulmonary opacities may reflect edema or aspiration. No evidence of rib fracture or pneumothorax.   Electronically Signed   By: Carey Bullocks M.D.   On: 01/08/2015 15:44         ASSESSMENT / PLAN:  PULMONARY OETT 01/08/2015  A: Acute respiratory failure - post STEMI/ cardiac arrest  P:   Full vent support  CARDIOVASCULAR CVLpending 01/08/2015 >>  A: V Fib arrest due to STEMI P:  Per cards  RENAL A:  AKI ? Due to shock . AT risk for ATN folowing cath Mild hypokalemia + P:   Maintain bp/hr Close eye on creat Check mag and phos - goal magp > 2gm% Goal K > 4; replete now   GASTROINTESTINAL A:  Elevated LFT - suspect shock liver P:   Repeat   HEMATOLOGIC A:  At risk for bleeding P:  Monitor closely PRBC goal is > 8gm%  INFECTIOUS A:  No evidence of infection P:   Monitor clinically  ENDOCRINE A:  Unclear if DM or hyperlipid   P:   monitor  NEUROLOGIC A:  At risk for anoxic enceph. P:   Induced hypothermia Fent/versed/nimbex RASS goal -5 Bis score < 50 TOF - sync wwith vent  FAMILY  - Updates: no family at bedside 01/08/15  - Inter-disciplinary family meet or Palliative Care meeting due by:  6/12/`16   The patient is critically ill with multiple organ systems failure and requires high complexity decision making for assessment and support, frequent evaluation and titration of therapies, application of advanced monitoring technologies and extensive interpretation of multiple databases.   Critical Care Time devoted to patient care services described in this note is  45  Minutes. This time  reflects time of care of this signee Dr Kalman Shan. This critical care time does not reflect procedure time, or teaching time or supervisory time of PA/NP/Med student/Med Resident etc but could involve care discussion time    Dr. Kalman Shan, M.D., [facility].C.P Pulmonary and Critical Care Medicine Staff Physician [facility] [place] [facility] Critical [facility] Pager: [phone], If no answer or between  15:00h - 7:00h: call [phone]  01/08/2015 6:31 PM

## 2015-01-08 NOTE — Progress Notes (Signed)
Pt reached goal temperature of 33C at 2200.

## 2015-01-08 NOTE — Progress Notes (Signed)
ANTICOAGULATION CONSULT NOTE - Initial Consult  Pharmacy Consult for angiomax Indication: post cath  Not on File  Patient Measurements: Height: 5\' 7"  (170.2 cm) Weight: 220 lb 7.4 oz (100 kg) IBW/kg (Calculated) : 66.1 Heparin Dosing Weight:   Vital Signs: Temp: 98.2 F (36.8 C) (06/05 1900) Temp Source: Core (Comment) (06/05 1900) BP: 113/71 mmHg (06/05 2000) Pulse Rate: 74 (06/05 2006)  Labs:  Recent Labs  01/08/15 1515 01/08/15 1845  HGB 13.7  --   HCT 43.0  --   PLT 231  --   APTT 55* >200*  LABPROT 18.5* >90.0*  INR 1.54* >10.00*  CREATININE 2.15* 2.05*  TROPONINI  --  13.65*    Estimated Creatinine Clearance: 47.5 mL/min (by C-G formula based on Cr of 2.05).   Medical History: Past Medical History  Diagnosis Date  . Hypertension     Medications:  Scheduled:  . artificial tears  1 application Both Eyes 3 times per day  . aspirin EC  81 mg Oral Daily  . [START ON 01/09/2015] atorvastatin  80 mg Oral q1800  . sodium chloride  3 mL Intravenous Q12H  . [START ON 01/09/2015] ticagrelor  90 mg Oral BID   Infusions:  . sodium chloride 20 mL/hr at 01/08/15 1754  . sodium chloride    . amiodarone 60 mg/hr (01/08/15 1755)  . amiodarone    . bivalirudin (ANGIOMAX) infusion 5 mg/mL (Cath Lab,ACS,PCI indication)    . cisatracurium (NIMBEX) infusion    . fentaNYL infusion INTRAVENOUS    . insulin (NOVOLIN-R) infusion 2.1 Units/hr (01/08/15 2036)  . midazolam (VERSED) infusion    . norepinephrine (LEVOPHED) Adult infusion    . phenylephrine (NEO-SYNEPHRINE) Adult infusion 70 mcg/min (01/08/15 2040)  . tirofiban      Assessment: 49 yo male will be continue on bivalirudin x 4hrs at full dose per MD's request.  CrCl > 30 Goal of Therapy:   Monitor platelets by anticoagulation protocol: Yes   Plan:  - continue angiomax @ 1.75 mg/kg/hr x 4hrs  Kee Drudge, [place]z[facility]in 01/08/2015,9:03 PM

## 2015-01-08 NOTE — ED Notes (Signed)
Pt playing soccer and witnessed arrest. Fire arrived and found pulseless bystanders helped with CPR. King airway placed and still in place. VF on the monitor. EMS arrived to scene. VF on monitor, shocked total of 8 times, given 9 total epis, amio bolus of 150 mg and then 360 mg. IO to right tibia. 12 lead on dock and paged Code Stemi

## 2015-01-09 ENCOUNTER — Inpatient Hospital Stay (HOSPITAL_COMMUNITY): Payer: Medicaid Other

## 2015-01-09 ENCOUNTER — Encounter (HOSPITAL_COMMUNITY): Payer: Self-pay | Admitting: Cardiovascular Disease

## 2015-01-09 DIAGNOSIS — R739 Hyperglycemia, unspecified: Secondary | ICD-10-CM

## 2015-01-09 DIAGNOSIS — G9340 Encephalopathy, unspecified: Secondary | ICD-10-CM | POA: Diagnosis present

## 2015-01-09 DIAGNOSIS — J810 Acute pulmonary edema: Secondary | ICD-10-CM

## 2015-01-09 LAB — GLUCOSE, CAPILLARY
GLUCOSE-CAPILLARY: 129 mg/dL — AB (ref 65–99)
GLUCOSE-CAPILLARY: 135 mg/dL — AB (ref 65–99)
GLUCOSE-CAPILLARY: 137 mg/dL — AB (ref 65–99)
GLUCOSE-CAPILLARY: 140 mg/dL — AB (ref 65–99)
GLUCOSE-CAPILLARY: 141 mg/dL — AB (ref 65–99)
GLUCOSE-CAPILLARY: 142 mg/dL — AB (ref 65–99)
GLUCOSE-CAPILLARY: 164 mg/dL — AB (ref 65–99)
GLUCOSE-CAPILLARY: 169 mg/dL — AB (ref 65–99)
GLUCOSE-CAPILLARY: 211 mg/dL — AB (ref 65–99)
GLUCOSE-CAPILLARY: 284 mg/dL — AB (ref 65–99)
Glucose-Capillary: 189 mg/dL — ABNORMAL HIGH (ref 65–99)
Glucose-Capillary: 242 mg/dL — ABNORMAL HIGH (ref 65–99)
Glucose-Capillary: 258 mg/dL — ABNORMAL HIGH (ref 65–99)
Glucose-Capillary: 265 mg/dL — ABNORMAL HIGH (ref 65–99)
Glucose-Capillary: 141 mg/dL — ABNORMAL HIGH (ref 65–99)
Glucose-Capillary: 142 mg/dL — ABNORMAL HIGH (ref 65–99)
Glucose-Capillary: 148 mg/dL — ABNORMAL HIGH (ref 65–99)
Glucose-Capillary: 147 mg/dL — ABNORMAL HIGH (ref 65–99)
Glucose-Capillary: 144 mg/dL — ABNORMAL HIGH (ref 65–99)
Glucose-Capillary: 149 mg/dL — ABNORMAL HIGH (ref 65–99)
Glucose-Capillary: 153 mg/dL — ABNORMAL HIGH (ref 65–99)
Glucose-Capillary: 135 mg/dL — ABNORMAL HIGH (ref 65–99)

## 2015-01-09 LAB — POCT I-STAT, CHEM 8
BUN: 22 mg/dL — ABNORMAL HIGH (ref 6–20)
BUN: 23 mg/dL — ABNORMAL HIGH (ref 6–20)
BUN: 23 mg/dL — ABNORMAL HIGH (ref 6–20)
CALCIUM ION: 1.25 mmol/L — AB (ref 1.12–1.23)
CALCIUM ION: 1.21 mmol/L (ref 1.12–1.23)
CREATININE: 2 mg/dL — AB (ref 0.61–1.24)
CREATININE: 2 mg/dL — AB (ref 0.61–1.24)
Calcium, Ion: 1.23 mmol/L (ref 1.12–1.23)
Chloride: 113 mmol/L — ABNORMAL HIGH (ref 101–111)
Chloride: 113 mmol/L — ABNORMAL HIGH (ref 101–111)
Chloride: 115 mmol/L — ABNORMAL HIGH (ref 101–111)
Creatinine, Ser: 2.1 mg/dL — ABNORMAL HIGH (ref 0.61–1.24)
GLUCOSE: 163 mg/dL — AB (ref 65–99)
Glucose, Bld: 144 mg/dL — ABNORMAL HIGH (ref 65–99)
Glucose, Bld: 168 mg/dL — ABNORMAL HIGH (ref 65–99)
HCT: 39 % (ref 39.0–52.0)
HCT: 40 % (ref 39.0–52.0)
HCT: 38 % — ABNORMAL LOW (ref 39.0–52.0)
HEMOGLOBIN: 13.6 g/dL (ref 13.0–17.0)
Hemoglobin: 13.3 g/dL (ref 13.0–17.0)
Hemoglobin: 12.9 g/dL — ABNORMAL LOW (ref 13.0–17.0)
Potassium: 4.2 mmol/L (ref 3.5–5.1)
Potassium: 4.7 mmol/L (ref 3.5–5.1)
Potassium: 4.8 mmol/L (ref 3.5–5.1)
SODIUM: 143 mmol/L (ref 135–145)
SODIUM: 138 mmol/L (ref 135–145)
Sodium: 140 mmol/L (ref 135–145)
TCO2: 15 mmol/L (ref 0–100)
TCO2: 15 mmol/L (ref 0–100)
TCO2: 16 mmol/L (ref 0–100)

## 2015-01-09 LAB — BLOOD GAS, ARTERIAL
Acid-base deficit: 11.9 mmol/L — ABNORMAL HIGH (ref 0.0–2.0)
BICARBONATE: 14.4 meq/L — AB (ref 20.0–24.0)
FIO2: 100 %
LHR: 16 {breaths}/min
MECHVT: 620 mL
O2 Saturation: 99.2 %
PEEP/CPAP: 5 cmH2O
Patient temperature: 98.6
TCO2: 15.5 mmol/L (ref 0–100)
pCO2 arterial: 36.9 mmHg (ref 35.0–45.0)
pH, Arterial: 7.215 — ABNORMAL LOW (ref 7.350–7.450)
pO2, Arterial: 292 mmHg — ABNORMAL HIGH (ref 80.0–100.0)

## 2015-01-09 LAB — BASIC METABOLIC PANEL
ANION GAP: 9 (ref 5–15)
ANION GAP: 7 (ref 5–15)
Anion gap: 9 (ref 5–15)
BUN: 20 mg/dL (ref 6–20)
BUN: 19 mg/dL (ref 6–20)
BUN: 24 mg/dL — AB (ref 6–20)
CALCIUM: 8 mg/dL — AB (ref 8.9–10.3)
CHLORIDE: 113 mmol/L — AB (ref 101–111)
CHLORIDE: 113 mmol/L — AB (ref 101–111)
CO2: 17 mmol/L — ABNORMAL LOW (ref 22–32)
CO2: 18 mmol/L — ABNORMAL LOW (ref 22–32)
CO2: 17 mmol/L — ABNORMAL LOW (ref 22–32)
Calcium: 7.5 mg/dL — ABNORMAL LOW (ref 8.9–10.3)
Calcium: 7.8 mg/dL — ABNORMAL LOW (ref 8.9–10.3)
Chloride: 113 mmol/L — ABNORMAL HIGH (ref 101–111)
Creatinine, Ser: 2.17 mg/dL — ABNORMAL HIGH (ref 0.61–1.24)
Creatinine, Ser: 2.11 mg/dL — ABNORMAL HIGH (ref 0.61–1.24)
Creatinine, Ser: 1.91 mg/dL — ABNORMAL HIGH (ref 0.61–1.24)
GFR calc Af Amer: 38 mL/min — ABNORMAL LOW (ref >60)
GFR calc Af Amer: 40 mL/min — ABNORMAL LOW (ref >60)
GFR calc non Af Amer: 33 mL/min — ABNORMAL LOW (ref >60)
GFR, EST AFRICAN AMERICAN: 47 mL/min — AB (ref >60)
GFR, EST NON AFRICAN AMERICAN: 34 mL/min — AB (ref >60)
GFR, EST NON AFRICAN AMERICAN: 40 mL/min — AB (ref >60)
GLUCOSE: 197 mg/dL — AB (ref 65–99)
Glucose, Bld: 154 mg/dL — ABNORMAL HIGH (ref 65–99)
Glucose, Bld: 155 mg/dL — ABNORMAL HIGH (ref 65–99)
Potassium: 4.3 mmol/L (ref 3.5–5.1)
Potassium: 4 mmol/L (ref 3.5–5.1)
Potassium: 4.9 mmol/L (ref 3.5–5.1)
SODIUM: 140 mmol/L (ref 135–145)
SODIUM: 137 mmol/L (ref 135–145)
Sodium: 139 mmol/L (ref 135–145)

## 2015-01-09 LAB — HEPATIC FUNCTION PANEL
ALT: 1352 U/L — AB (ref 17–63)
AST: 1757 U/L — AB (ref 15–41)
Albumin: 3.4 g/dL — ABNORMAL LOW (ref 3.5–5.0)
Alkaline Phosphatase: 53 U/L (ref 38–126)
BILIRUBIN TOTAL: 0.8 mg/dL (ref 0.3–1.2)
Bilirubin, Direct: 0.2 mg/dL (ref 0.1–0.5)
Indirect Bilirubin: 0.6 mg/dL (ref 0.3–0.9)
Total Protein: 6.1 g/dL — ABNORMAL LOW (ref 6.5–8.1)

## 2015-01-09 LAB — CBC
HCT: 40.4 % (ref 39.0–52.0)
Hemoglobin: 13 g/dL (ref 13.0–17.0)
MCH: 27.7 pg (ref 26.0–34.0)
MCHC: 32.2 g/dL (ref 30.0–36.0)
MCV: 86 fL (ref 78.0–100.0)
PLATELETS: 280 10*3/uL (ref 150–400)
RBC: 4.7 MIL/uL (ref 4.22–5.81)
RDW: 14.1 % (ref 11.5–15.5)
WBC: 13.7 10*3/uL — ABNORMAL HIGH (ref 4.0–10.5)

## 2015-01-09 LAB — MAGNESIUM: Magnesium: 2.2 mg/dL (ref 1.7–2.4)

## 2015-01-09 LAB — APTT: APTT: 59 s — AB (ref 24–37)

## 2015-01-09 LAB — MRSA PCR SCREENING: MRSA BY PCR: NEGATIVE

## 2015-01-09 LAB — PROTIME-INR
INR: 1.7 — AB (ref 0.00–1.49)
PROTHROMBIN TIME: 20 s — AB (ref 11.6–15.2)

## 2015-01-09 LAB — TROPONIN I
TROPONIN I: 62.31 ng/mL — AB (ref <0.031)
Troponin I: 58.08 ng/mL (ref <0.031)
Troponin I: >65 ng/mL (ref <0.031)

## 2015-01-09 LAB — PHOSPHORUS: Phosphorus: 2.3 mg/dL — ABNORMAL LOW (ref 2.5–4.6)

## 2015-01-09 MED ORDER — SODIUM CHLORIDE 0.9 % IV SOLN
INTRAVENOUS | Status: DC
  Administered 2015-01-11 – 2015-01-14 (×3): via INTRAVENOUS
  Administered 2015-01-21: 10 mL/h via INTRAVENOUS

## 2015-01-09 MED ORDER — PERFLUTREN LIPID MICROSPHERE
INTRAVENOUS | Status: AC
  Filled 2015-01-09: qty 10

## 2015-01-09 MED ORDER — CETYLPYRIDINIUM CHLORIDE 0.05 % MT LIQD
7.0000 mL | Freq: Four times a day (QID) | OROMUCOSAL | Status: DC
  Administered 2015-01-09 – 2015-01-22 (×52): 7 mL via OROMUCOSAL

## 2015-01-09 MED ORDER — CHLORHEXIDINE GLUCONATE 0.12 % MT SOLN
15.0000 mL | Freq: Two times a day (BID) | OROMUCOSAL | Status: DC
  Administered 2015-01-09 – 2015-01-22 (×27): 15 mL via OROMUCOSAL
  Filled 2015-01-09 (×27): qty 15

## 2015-01-09 MED ORDER — PERFLUTREN LIPID MICROSPHERE
1.0000 mL | INTRAVENOUS | Status: AC | PRN
  Administered 2015-01-09: 4 mL via INTRAVENOUS
  Filled 2015-01-09: qty 10

## 2015-01-09 MED ORDER — INSULIN ASPART 100 UNIT/ML ~~LOC~~ SOLN
2.0000 [IU] | SUBCUTANEOUS | Status: DC
  Administered 2015-01-09: 2 [IU] via SUBCUTANEOUS

## 2015-01-09 MED ORDER — DEXTROSE 10 % IV SOLN: INTRAVENOUS | Status: DC | PRN

## 2015-01-09 MED ORDER — PANTOPRAZOLE SODIUM 40 MG IV SOLR
40.0000 mg | Freq: Every day | INTRAVENOUS | Status: DC
  Administered 2015-01-09 (×2): 40 mg via INTRAVENOUS
  Filled 2015-01-09 (×3): qty 40

## 2015-01-09 MED ORDER — INSULIN GLARGINE 100 UNIT/ML ~~LOC~~ SOLN
20.0000 [IU] | SUBCUTANEOUS | Status: DC
  Administered 2015-01-09: 20 [IU] via SUBCUTANEOUS
  Filled 2015-01-09 (×2): qty 0.2

## 2015-01-09 MED ORDER — SODIUM CHLORIDE 0.9 % IV SOLN: INTRAVENOUS | Status: DC

## 2015-01-09 MED ORDER — "THROMBI-PAD 3""X3"" EX PADS"
1.0000 | MEDICATED_PAD | Freq: Once | CUTANEOUS | Status: DC
  Filled 2015-01-09: qty 1

## 2015-01-09 NOTE — Progress Notes (Signed)
PULMONARY / CRITICAL CARE MEDICINE   Name: Alexander Berry MRN: [id number] DOB: 02/10/1961    ADMISSION DATE:  01/08/2015 CONSULTATION DATE:  01/08/2015    PT PROFILE:   VF arrest while playing soccer 6/05. Prolonged ACLS. DES to RCA. Hypothermia protocol  MAJOR EVENTS/TEST RESULTS: 6/05 Admitted after OOH arrest 6/05 LHC: Dist RCA lesion, 80% stenosed. Mid RCA lesion, 100% stenosed. There is a 0% residual stenosis post intervention. A drug-eluting stent was placed. 2nd RPLB-1 lesion, 100% stenosed. 2nd RPLB-2 lesion, 100% stenosed. There is a 0% residual stenosis post intervention. RPDA lesion, 80% stenosed. There is a 0% residual stenosis post intervention. 6/06 TTE:   INDWELLING DEVICES:: ETT 6/05 >>  R IJ CVL 6/05 >>    MICRO DATA:   ANTIMICROBIALS:    SUBJ: Hypothermic, sedated, paralyzed, intubated   VITAL SIGNS: Temp:  [89.4 F (31.9 C)-98.2 F (36.8 C)] 91.2 F (32.9 C) (06/06 1500) Pulse Rate:  [42-184] 50 (06/06 1500) Resp:  [13-37] 14 (06/06 1500) BP: (83-169)/(44-103) 151/78 mmHg (06/06 1201) SpO2:  [0 %-100 %] 100 % (06/06 1500) Arterial Line BP: (90-164)/(56-103) 136/70 mmHg (06/06 1500) FiO2 (%):  [70 %-100 %] 70 % (06/06 1300) Weight:  [100 kg (220 lb 7.4 oz)] 100 kg (220 lb 7.4 oz) (06/05 1830) HEMODYNAMICS: CVP:  [4 mmHg-10 mmHg] 6 mmHg VENTILATOR SETTINGS: Vent Mode:  [-] PRVC FiO2 (%):  [70 %-100 %] 70 % Set Rate:  [14 bmp-16 bmp] 14 bmp Vt Set:  [620 mL] 620 mL PEEP:  [5 cmH20] 5 cmH20 Plateau Pressure:  [22 cmH20-23 cmH20] 22 cmH20 INTAKE / OUTPUT:  Intake/Output Summary (Last 24 hours) at 01/09/15 1605 Last data filed at 01/09/15 1304  Gross per 24 hour  Intake   2073 ml  Output   2020 ml  Net     53 ml    PHYSICAL EXAMINATION: General: Hypothermic, sedated, paralyzed, intubated  Neuro:  Cannot perform HEENT: WNL Cardiovascular:  Reg, no M Lungs: clear anteriorly Abdomen: soft, diminished BS Ext: cool, no  edema  LABS:  PULMONARY  Recent Labs Lab 01/08/15 2005 01/08/15 2106 01/09/15 0340 01/09/15 0741 01/09/15 1229  PHART  --   --  7.215*  --   --   PCO2ART  --   --  36.9  --   --   PO2ART  --   --  292*  --   --   HCO3  --   --  14.4*  --   --   TCO2 12 12 15.5 15 15   O2SAT  --   --  99.2  --   --     CBC  Recent Labs Lab 01/08/15 1515  01/09/15 0050 01/09/15 0741 01/09/15 1229  HGB 13.7  < > 13.0 13.3 13.6  HCT 43.0  < > 40.4 39.0 40.0  WBC 6.8  --  13.7*  --   --   PLT 231  --  280  --   --   < > = values in this interval not displayed.  COAGULATION  Recent Labs Lab 01/08/15 1515 01/08/15 1845 01/08/15 2300  INR 1.54* >10.00* 1.70*    CARDIAC    Recent Labs Lab 01/08/15 1845 01/08/15 2300 01/09/15 0050 01/09/15 0751 01/09/15 1200  TROPONINI 13.65* 58.08* 62.31* >65.00* >65.00*   No results for input(s): PROBNP in the last 168 hours.   CHEMISTRY  Recent Labs Lab 01/08/15 1515 01/08/15 1845  01/08/15 2300 01/09/15 0050 01/09/15 0445 01/09/15 0741 01/09/15 1229  NA 142 137  < > 138 139 140 143 140  K 2.9* 3.8  < > 4.3 4.3 4.0 4.2 4.7  CL 105 106  < > 111 113* 113* 113* 113*  CO2 13* 16*  --  16* 17* 18*  --   --   GLUCOSE 354* 339*  < > 219* 197* 154* 144* 168*  BUN 15 16  < > 22* 23*  CREATININE 2.15* 2.05*  < > 2.14* 2.17* 2.11* 2.10* 2.00*  CALCIUM 8.7* 7.3*  --  7.5* 7.5* 7.8*  --   --   MG  --   --   --   --   --  2.2  --   --   PHOS  --   --   --   --   --  2.3*  --   --   < > = values in this interval not displayed. Estimated Creatinine Clearance: 48.7 mL/min (by C-G formula based on Cr of 2).   LIVER  Recent Labs Lab 01/08/15 1515 01/08/15 1845 01/08/15 2300 01/09/15 0445  AST 848*  --   --  1757*  ALT 1428*  --   --  1352*  ALKPHOS 70  --   --  53  BILITOT 0.6  --   --  0.8  PROT 6.2*  --   --  6.1*  ALBUMIN 3.8  --   --  3.4*  INR 1.54* >10.00* 1.70*  --      INFECTIOUS  Recent Labs Lab  01/08/15 1920  LATICACIDVEN 9.3*     ENDOCRINE CBG (last 3)   Recent Labs  01/09/15 0934 01/09/15 1038 01/09/15 1153  GLUCAP 142* 137* 147*         IMAGING x48h  - image(s) personally visualized  -   highlighted in bold Dg Chest Port 1 View  01/09/2015   CLINICAL DATA:  RIGHT central venous line placement.  EXAM: PORTABLE CHEST - 1 VIEW  COMPARISON:  Portable film earlier in the day.  FINDINGS: Unchanged endotracheal tube. RIGHT IJ catheter has been placed and lies with its tip in the proximal SVC. No visible pneumothorax. BILATERAL airspace opacities persist without significant improvement. Retrocardiac density remains concerning for LEFT lower lobe atelectasis or infiltrate. Orogastric tube choroidal than stomach. Stable cardiomediastinal silhouette.  IMPRESSION: RIGHT IJ catheter proximal SVC. No pneumothorax. It is stable airspace opacities and retrocardiac density.   Electronically Signed   By: Davonna Belling M.D.   On: 01/09/2015 00:34   Dg Chest Port 1 View  01/08/2015   CLINICAL DATA:  Endotracheal and nasogastric tube placement.  EXAM: PORTABLE CHEST - 1 VIEW  COMPARISON:  Earlier the same date.  FINDINGS: 1836 hours. The endotracheal tube is unchanged within the mid trachea. A nasogastric tube projects below the diaphragm. The heart size and mediastinal contours are stable. There are worsening bilateral perihilar airspace opacities with some volume loss in the retrocardiac left lower lobe. No pneumothorax or significant pleural effusion identified. No evidence of rib fracture.  IMPRESSION: The endotracheal and nasogastric tubes remain satisfactorily positioned. Worsening bilateral airspace opacities.   Electronically Signed   By: Carey Bullocks M.D.   On: 01/08/2015 18:52   Dg Chest Port 1 View  01/08/2015   CLINICAL DATA:  Cardiopulmonary arrest playing soccer. Initial encounter.  EXAM: PORTABLE CHEST - 1 VIEW  COMPARISON:  None.  FINDINGS: 1532 hours. Endotracheal tube tip is in  the midtrachea, 3.0 cm above the  carina. A nasogastric tube extends into the stomach, tip not visualized. No definite central venous catheter identified. There are numerous telemetry leads overlying the chest. There are low lung volumes with left-greater-than-right perihilar airspace opacities. No pleural effusion or pneumothorax identified. The heart size mediastinal contours are normal allowing for mild patient rotation. No rib fractures identified.  IMPRESSION: Endotracheal and nasogastric tubes appears satisfactorily positioned. Left-greater-than-right perihilar pulmonary opacities may reflect edema or aspiration. No evidence of rib fracture or pneumothorax.   Electronically Signed   By: Carey Bullocks M.D.   On: 01/08/2015 15:44   Dg Abd Portable 1v  01/08/2015   CLINICAL DATA:  Endotracheal and nasogastric tube placement.  EXAM: PORTABLE ABDOMEN - 1 VIEW  COMPARISON:  Chest radiograph same date.  FINDINGS: 1840 hours. Nasogastric tube extends into the distal stomach. There are minimally prominent mid small bowel loops. There is gas within the stomach and colon. No bowel wall thickening or supine evidence of free intraperitoneal air. No acute osseous abnormalities identified.  IMPRESSION: Nasogastric tube tip in the distal stomach. Mild nonspecific small bowel dilatation.   Electronically Signed   By: Carey Bullocks M.D.   On: 01/08/2015 18:54         ASSESSMENT / PLAN: CARDIOVASCULAR A:  STEMI Out of hospital VF arrest Cardiogenic shock P:  Mgmt per Cards  PULMONARY A:  VDRF post arrest Pulm edema P:   Cont full vent support - settings reviewed and/or adjusted Cont vent bundle Daily SBT if/when meets criteria  RENAL A:  AKI, nonoliguric Hypokalemia, resolved P:   Monitor BMET intermittently Monitor I/Os Correct electrolytes as indicated   GASTROINTESTINAL A:   Elevated LFT - suspect shock liver P:   SUP: IV PPI No enteral nutrition until after rewarming Monitor  LFTs intermittently  HEMATOLOGIC A:   No issues P:  DVT px: Brilinta Monitor CBC intermittently Transfuse per usual ICU guidelines  INFECTIOUS A:   No evidence of infection P:   Monitor temp, WBC count Micro and abx as above  ENDOCRINE A:   Hyperglycemia P:   Cont insuiln gtt >> SSI  NEUROLOGIC A:   Anoxic encephalopathy P:   RASS cannot be assessed while on NMBs Cont sedation, NMBs while cooled Daily WUA after rewarming  FAMILY  - Updates: no family at bedside 01/08/15  CCM X 35 mins   Billy Fischer, MD ; [facility] service Mobile [phone].  After 5:30 PM or weekends, call [phone]

## 2015-01-09 NOTE — Progress Notes (Signed)
Bedside EEG completed, results pending. 

## 2015-01-09 NOTE — Procedures (Signed)
ELECTROENCEPHALOGRAM REPORT   Patient: Alexander Berry      Room #: 2H-12 Age: 49 y.o.        Sex: male Referring Physician: Dr Marchelle Gearingamaswamy Report Date:  01/09/2015        Interpreting Physician: Omelia BlackwaterSUMNER, Anjanae Woehrle JUSTIN  History: Alexander Berry is an 49 y.o. male post cardiac arrest  Medications:  Scheduled: . antiseptic oral rinse  7 mL Mouth Rinse QID  . artificial tears  1 application Both Eyes 3 times per day  . aspirin EC  81 mg Oral Daily  . chlorhexidine  15 mL Mouth Rinse BID  . pantoprazole (PROTONIX) IV  40 mg Intravenous QHS  . sodium chloride  3 mL Intravenous Q12H  . THROMBI-PAD  1 each Topical Once  . ticagrelor  90 mg Oral BID    Conditions of Recording:  This is a 16 channel EEG carried out with the patient in the intubated sedated state while undergoing the hypothermia protocol.   Description:  The waking background activity consists of a low voltage, symmetrical, fairly well organized mix of delta and theta activity seen from the parieto-occipital and posterior temporal regions.  Low voltage fast activity, poorly organized, is seen anteriorly and is at times superimposed on more posterior regions. No focal slowing or epileptiform activity is noted.   Normal sleep architecture is not observed. Hyperventilation was not performed. Intermittent photic stimulation was not performed.     IMPRESSION: Abnormal EEG due to the presence of a generalized slowing indicating a mild to moderate cerebral disturbance (encephalopathy). No epileptiform activity noted.    Elspeth Choeter Amandeep Nesmith, DO [facility] [phone]  If 7pm- 7am, please page neurology on call as listed in AMION. 01/09/2015, 2:19 PM

## 2015-01-09 NOTE — Progress Notes (Signed)
  Echocardiogram 2D Echocardiogram has been performed.  Leta JunglingCooper, Aisling Emigh M 01/09/2015, 11:38 AM

## 2015-01-09 NOTE — Progress Notes (Signed)
Subjective:  Intubated sedated and cooled  Objective:  Filed Vitals:   01/09/15 0530 01/09/15 0600 01/09/15 0630 01/09/15 0700  BP: 121/85 121/85 107/72 100/61  Pulse: 48 45 51 51  Temp:  89.8 F (32.1 C)  90 F (32.2 C)  TempSrc:  Core (Comment)  Core (Comment)  Resp: 14 14 14 14   Height:      Weight:      SpO2: 100% 100% 100% 100%    Intake/Output from previous day:  Intake/Output Summary (Last 24 hours) at 01/09/15 0805 Last data filed at 01/09/15 0700  Gross per 24 hour  Intake 1419.4 ml  Output   1800 ml  Net -380.6 ml    Physical Exam: Sedated  Hispanic male  HEENT: oozing right IJ catheter  Neck supple with no adenopathy JVP normal no bruits no thyromegaly Lungs clear with no wheezing and good diaphragmatic motion Heart:  S1/S2 no murmur, no rub, gallop or click PMI normal Abdomen: benighn, BS positve, no tenderness, no AAA no bruit.  No HSM or HJR  Oozing right femoral catheter  Distal pulses intact with no bruits Cooled    Lab Results: Basic Metabolic Panel:  Recent Labs  96/11/5404/06/16 0050 01/09/15 0445  NA 139 140  K 4.3 4.0  CL 113* 113*  CO2 17* 18*  GLUCOSE 197* 154*  BUN 20 19  CREATININE 2.17* 2.11*  CALCIUM 7.5* 7.8*  MG  --  2.2  PHOS  --  2.3*   Liver Function Tests:  Recent Labs  01/08/15 1515 01/09/15 0445  AST 848* 1757*  ALT 1428* 1352*  ALKPHOS 70 53  BILITOT 0.6 0.8  PROT 6.2* 6.1*  ALBUMIN 3.8 3.4*   CBC:  Recent Labs  01/08/15 1515  01/08/15 2106 01/09/15 0050  WBC 6.8  --   --  13.7*  HGB 13.7  < > 14.6 13.0  HCT 43.0  < > 43.0 40.4  MCV 88.1  --   --  86.0  PLT 231  --   --  280  < > = values in this interval not displayed. Cardiac Enzymes:  Recent Labs  01/08/15 1845 01/08/15 2300 01/09/15 0050  TROPONINI 13.65* 58.08* 62.31*    Imaging: Dg Chest Port 1 View  01/09/2015   CLINICAL DATA:  RIGHT central venous line placement.  EXAM: PORTABLE CHEST - 1 VIEW  COMPARISON:  Portable film earlier  in the day.  FINDINGS: Unchanged endotracheal tube. RIGHT IJ catheter has been placed and lies with its tip in the proximal SVC. No visible pneumothorax. BILATERAL airspace opacities persist without significant improvement. Retrocardiac density remains concerning for LEFT lower lobe atelectasis or infiltrate. Orogastric tube choroidal than stomach. Stable cardiomediastinal silhouette.  IMPRESSION: RIGHT IJ catheter proximal SVC. No pneumothorax. It is stable airspace opacities and retrocardiac density.   Electronically Signed   By: Davonna BellingJohn  Curnes M.D.   On: 01/09/2015 00:34   Dg Chest Port 1 View  01/08/2015   CLINICAL DATA:  Endotracheal and nasogastric tube placement.  EXAM: PORTABLE CHEST - 1 VIEW  COMPARISON:  Earlier the same date.  FINDINGS: 1836 hours. The endotracheal tube is unchanged within the mid trachea. A nasogastric tube projects below the diaphragm. The heart size and mediastinal contours are stable. There are worsening bilateral perihilar airspace opacities with some volume loss in the retrocardiac left lower lobe. No pneumothorax or significant pleural effusion identified. No evidence of rib fracture.  IMPRESSION: The endotracheal and nasogastric tubes remain satisfactorily positioned.  Worsening bilateral airspace opacities.   Electronically Signed   By: Carey Bullocks M.D.   On: 01/08/2015 18:52   Dg Chest Port 1 View  01/08/2015   CLINICAL DATA:  Cardiopulmonary arrest playing soccer. Initial encounter.  EXAM: PORTABLE CHEST - 1 VIEW  COMPARISON:  None.  FINDINGS: 1532 hours. Endotracheal tube tip is in the midtrachea, 3.0 cm above the carina. A nasogastric tube extends into the stomach, tip not visualized. No definite central venous catheter identified. There are numerous telemetry leads overlying the chest. There are low lung volumes with left-greater-than-right perihilar airspace opacities. No pleural effusion or pneumothorax identified. The heart size mediastinal contours are normal  allowing for mild patient rotation. No rib fractures identified.  IMPRESSION: Endotracheal and nasogastric tubes appears satisfactorily positioned. Left-greater-than-right perihilar pulmonary opacities may reflect edema or aspiration. No evidence of rib fracture or pneumothorax.   Electronically Signed   By: Carey Bullocks M.D.   On: 01/08/2015 15:44   Dg Abd Portable 1v  01/08/2015   CLINICAL DATA:  Endotracheal and nasogastric tube placement.  EXAM: PORTABLE ABDOMEN - 1 VIEW  COMPARISON:  Chest radiograph same date.  FINDINGS: 1840 hours. Nasogastric tube extends into the distal stomach. There are minimally prominent mid small bowel loops. There is gas within the stomach and colon. No bowel wall thickening or supine evidence of free intraperitoneal air. No acute osseous abnormalities identified.  IMPRESSION: Nasogastric tube tip in the distal stomach. Mild nonspecific small bowel dilatation.   Electronically Signed   By: Carey Bullocks M.D.   On: 01/08/2015 18:54    Cardiac Studies:  ECG:  SR rate 50 evolving IMI with persistent lateral ST depression    Telemetry: Sinus bradycardia PVC;s no NSVT    Echo:   Medications:   . antiseptic oral rinse  7 mL Mouth Rinse QID  . artificial tears  1 application Both Eyes 3 times per day  . aspirin EC  81 mg Oral Daily  . atorvastatin  80 mg Oral q1800  . chlorhexidine  15 mL Mouth Rinse BID  . pantoprazole (PROTONIX) IV  40 mg Intravenous QHS  . sodium chloride  3 mL Intravenous Q12H  . ticagrelor  90 mg Oral BID     . sodium chloride 10 mL/hr at 01/09/15 0000  . sodium chloride    . amiodarone 30 mg/hr (01/08/15 2138)  . cisatracurium (NIMBEX) infusion 1 mcg/kg/min (01/08/15 1900)  . fentaNYL infusion INTRAVENOUS 300 mcg/hr (01/09/15 0140)  . insulin (NOVOLIN-R) infusion 2.8 mL/hr at 01/09/15 0630  . midazolam (VERSED) infusion 6 mg/hr (01/09/15 0104)  . norepinephrine (LEVOPHED) Adult infusion 10 mcg/min (01/09/15 0445)  . phenylephrine  (NEO-SYNEPHRINE) Adult infusion Stopped (01/09/15 0015)  . tirofiban 0.075 mcg/kg/min (01/08/15 1900)    Assessment/Plan:  IMI:  Complicated by cardiac arrest Long area stenting mid RCA Continue ticagrelor.  Hemodynamic support with levophed still  Echo to assess RV/LV function Troponins very high Aggrestat done at noon  Will need to get right femoral sheath out tomorrow?  Cardiac Arrest  Coolong protocol  Neuro status in doubt  Continue amiodarone for ectopy and cardiac arrest   CCM:  Care per for cooling access and vent  [name] 01/09/2015, 8:05 AM

## 2015-01-09 NOTE — Progress Notes (Signed)
EKG CRITICAL VALUE     12 lead EKG performed.  Critical value noted.  Marco Collieharis Yimbu, RN notified.   Tanor Glaspy H, CCT 01/09/2015 6:52 AM

## 2015-01-09 NOTE — Care Management Note (Signed)
Case Management Note  Patient Details  Name: Alexander Berry MRN: [id number] Date of Birth: 02/10/1961  Subjective/Objective:      Adm w mi, vent              Action/Plan: lives w wife   Expected Discharge Date:                  Expected Discharge Plan:  Home w Home Health Services  In-House Referral:     Discharge planning Services     Post Acute Care Choice:    Choice offered to:     DME Arranged:    DME Agency:     HH Arranged:    HH Agency:     Status of Service:     Medicare Important Message Given:    Date Medicare IM Given:    Medicare IM give by:    Date Additional Medicare IM Given:    Additional Medicare Important Message give by:     If discussed at Long Length of Stay Meetings, dates discussed:    Additional Comments: ur review done. Left pt brilinta 30day free card. Placed brilinta pt assist form in shadow chart. No ins listed.  Hanley Haysowell, Novi Calia T, RN 01/09/2015, 10:31 AM

## 2015-01-10 ENCOUNTER — Inpatient Hospital Stay (HOSPITAL_COMMUNITY): Payer: Medicaid Other

## 2015-01-10 ENCOUNTER — Encounter (HOSPITAL_COMMUNITY): Payer: Self-pay

## 2015-01-10 LAB — CBC
HCT: 34.4 % — ABNORMAL LOW (ref 39.0–52.0)
Hemoglobin: 11.4 g/dL — ABNORMAL LOW (ref 13.0–17.0)
MCH: 27.7 pg (ref 26.0–34.0)
MCHC: 33.1 g/dL (ref 30.0–36.0)
MCV: 83.5 fL (ref 78.0–100.0)
PLATELETS: 149 10*3/uL — AB (ref 150–400)
RBC: 4.12 MIL/uL — ABNORMAL LOW (ref 4.22–5.81)
RDW: 14.1 % (ref 11.5–15.5)
WBC: 9.7 10*3/uL (ref 4.0–10.5)

## 2015-01-10 LAB — BASIC METABOLIC PANEL
ANION GAP: 7 (ref 5–15)
ANION GAP: 9 (ref 5–15)
Anion gap: 6 (ref 5–15)
BUN: 21 mg/dL — ABNORMAL HIGH (ref 6–20)
BUN: 23 mg/dL — ABNORMAL HIGH (ref 6–20)
BUN: 22 mg/dL — ABNORMAL HIGH (ref 6–20)
CALCIUM: 8.2 mg/dL — AB (ref 8.9–10.3)
CALCIUM: 7.9 mg/dL — AB (ref 8.9–10.3)
CHLORIDE: 112 mmol/L — AB (ref 101–111)
CO2: 18 mmol/L — ABNORMAL LOW (ref 22–32)
CO2: 20 mmol/L — ABNORMAL LOW (ref 22–32)
CO2: 20 mmol/L — ABNORMAL LOW (ref 22–32)
CREATININE: 1.95 mg/dL — AB (ref 0.61–1.24)
CREATININE: 1.91 mg/dL — AB (ref 0.61–1.24)
Calcium: 8.1 mg/dL — ABNORMAL LOW (ref 8.9–10.3)
Chloride: 112 mmol/L — ABNORMAL HIGH (ref 101–111)
Chloride: 113 mmol/L — ABNORMAL HIGH (ref 101–111)
Creatinine, Ser: 1.96 mg/dL — ABNORMAL HIGH (ref 0.61–1.24)
GFR calc Af Amer: 45 mL/min — ABNORMAL LOW (ref >60)
GFR calc non Af Amer: 39 mL/min — ABNORMAL LOW (ref >60)
GFR, EST AFRICAN AMERICAN: 45 mL/min — AB (ref >60)
GFR, EST AFRICAN AMERICAN: 47 mL/min — AB (ref >60)
GFR, EST NON AFRICAN AMERICAN: 39 mL/min — AB (ref >60)
GFR, EST NON AFRICAN AMERICAN: 40 mL/min — AB (ref >60)
GLUCOSE: 103 mg/dL — AB (ref 65–99)
GLUCOSE: 124 mg/dL — AB (ref 65–99)
Glucose, Bld: 109 mg/dL — ABNORMAL HIGH (ref 65–99)
POTASSIUM: 4.4 mmol/L (ref 3.5–5.1)
POTASSIUM: 4.6 mmol/L (ref 3.5–5.1)
Potassium: 4 mmol/L (ref 3.5–5.1)
SODIUM: 137 mmol/L (ref 135–145)
SODIUM: 141 mmol/L (ref 135–145)
Sodium: 139 mmol/L (ref 135–145)

## 2015-01-10 LAB — GLUCOSE, CAPILLARY
GLUCOSE-CAPILLARY: 100 mg/dL — AB (ref 65–99)
GLUCOSE-CAPILLARY: 112 mg/dL — AB (ref 65–99)
GLUCOSE-CAPILLARY: 120 mg/dL — AB (ref 65–99)
Glucose-Capillary: 115 mg/dL — ABNORMAL HIGH (ref 65–99)
Glucose-Capillary: 88 mg/dL (ref 65–99)

## 2015-01-10 LAB — POCT ACTIVATED CLOTTING TIME
ACTIVATED CLOTTING TIME: 786 s
ACTIVATED CLOTTING TIME: 171 s

## 2015-01-10 MED ORDER — FAMOTIDINE 40 MG/5ML PO SUSR
20.0000 mg | Freq: Every day | ORAL | Status: DC
  Administered 2015-01-10 – 2015-01-23 (×12): 20 mg
  Filled 2015-01-10 (×14): qty 2.5

## 2015-01-10 MED ORDER — MIDAZOLAM HCL 2 MG/2ML IJ SOLN
2.0000 mg | INTRAMUSCULAR | Status: DC | PRN
  Administered 2015-01-10 (×3): 2 mg via INTRAVENOUS
  Filled 2015-01-10 (×3): qty 2

## 2015-01-10 MED ORDER — INSULIN ASPART 100 UNIT/ML ~~LOC~~ SOLN
0.0000 [IU] | SUBCUTANEOUS | Status: DC
  Administered 2015-01-10 – 2015-01-13 (×8): 2 [IU] via SUBCUTANEOUS
  Administered 2015-01-14: 3 [IU] via SUBCUTANEOUS
  Administered 2015-01-14 – 2015-01-22 (×14): 2 [IU] via SUBCUTANEOUS

## 2015-01-10 MED ORDER — PRO-STAT SUGAR FREE PO LIQD
30.0000 mL | Freq: Two times a day (BID) | ORAL | Status: DC
  Administered 2015-01-10 – 2015-01-13 (×6): 30 mL
  Filled 2015-01-10 (×7): qty 30

## 2015-01-10 MED ORDER — HYDRALAZINE HCL 20 MG/ML IJ SOLN
10.0000 mg | INTRAMUSCULAR | Status: DC | PRN
  Administered 2015-01-10 – 2015-01-12 (×2): 20 mg via INTRAVENOUS
  Administered 2015-01-14: 10 mg via INTRAVENOUS
  Administered 2015-01-15 – 2015-01-19 (×3): 20 mg via INTRAVENOUS
  Administered 2015-01-19: 10 mg via INTRAVENOUS
  Filled 2015-01-10 (×5): qty 1
  Filled 2015-01-10: qty 2
  Filled 2015-01-10: qty 1

## 2015-01-10 MED ORDER — FENTANYL CITRATE (PF) 100 MCG/2ML IJ SOLN
25.0000 ug | INTRAMUSCULAR | Status: DC | PRN
  Administered 2015-01-10 (×3): 100 ug via INTRAVENOUS
  Filled 2015-01-10 (×3): qty 2

## 2015-01-10 MED ORDER — FENTANYL CITRATE (PF) 100 MCG/2ML IJ SOLN
25.0000 ug | INTRAMUSCULAR | Status: DC | PRN
  Administered 2015-01-10 (×3): 100 ug via INTRAVENOUS
  Administered 2015-01-11: 50 ug via INTRAVENOUS
  Administered 2015-01-11 (×2): 100 ug via INTRAVENOUS
  Administered 2015-01-12 – 2015-01-13 (×3): 50 ug via INTRAVENOUS
  Administered 2015-01-13: 100 ug via INTRAVENOUS
  Filled 2015-01-10 (×5): qty 2

## 2015-01-10 MED ORDER — FUROSEMIDE 10 MG/ML IJ SOLN
40.0000 mg | Freq: Once | INTRAMUSCULAR | Status: AC
  Administered 2015-01-10: 40 mg via INTRAVENOUS
  Filled 2015-01-10: qty 4

## 2015-01-10 MED ORDER — TICAGRELOR 90 MG PO TABS
90.0000 mg | ORAL_TABLET | Freq: Two times a day (BID) | ORAL | Status: DC
  Administered 2015-01-10 – 2015-01-23 (×26): 90 mg
  Filled 2015-01-10 (×27): qty 1

## 2015-01-10 MED ORDER — DOCUSATE SODIUM 50 MG/5ML PO LIQD
100.0000 mg | Freq: Two times a day (BID) | ORAL | Status: DC | PRN
  Filled 2015-01-10: qty 10

## 2015-01-10 MED ORDER — ACETAMINOPHEN 325 MG PO TABS
650.0000 mg | ORAL_TABLET | ORAL | Status: DC | PRN
  Administered 2015-01-12 – 2015-01-13 (×2): 650 mg
  Filled 2015-01-10 (×3): qty 2

## 2015-01-10 MED ORDER — VITAL HIGH PROTEIN PO LIQD
1000.0000 mL | ORAL | Status: DC
  Filled 2015-01-10 (×2): qty 1000

## 2015-01-10 MED ORDER — ASPIRIN 81 MG PO CHEW
81.0000 mg | CHEWABLE_TABLET | Freq: Every day | ORAL | Status: DC
  Administered 2015-01-11 – 2015-01-23 (×13): 81 mg
  Filled 2015-01-10 (×13): qty 1

## 2015-01-10 MED ORDER — METOPROLOL TARTRATE 1 MG/ML IV SOLN
2.5000 mg | INTRAVENOUS | Status: DC | PRN
  Administered 2015-01-10 – 2015-01-14 (×5): 5 mg via INTRAVENOUS
  Filled 2015-01-10 (×5): qty 5

## 2015-01-10 MED ORDER — MIDAZOLAM HCL 2 MG/2ML IJ SOLN
2.0000 mg | INTRAMUSCULAR | Status: DC | PRN
  Administered 2015-01-10 – 2015-01-12 (×8): 2 mg via INTRAVENOUS
  Filled 2015-01-10 (×8): qty 2

## 2015-01-10 MED ORDER — VITAL HIGH PROTEIN PO LIQD
1000.0000 mL | ORAL | Status: DC
  Administered 2015-01-10 – 2015-01-16 (×7): 1000 mL
  Filled 2015-01-10 (×16): qty 1000

## 2015-01-10 MED FILL — Lidocaine HCl Local Preservative Free (PF) Inj 1%: INTRAMUSCULAR | Qty: 30 | Status: AC

## 2015-01-10 MED FILL — Heparin Sodium (Porcine) 2 Unit/ML in Sodium Chloride 0.9%: INTRAMUSCULAR | Qty: 1500 | Status: AC

## 2015-01-10 NOTE — Progress Notes (Signed)
Post sheath removal note  Pre sheath removal   VS stable 100/78  SR 98   276fr sheath removed from right femoral artery  Manual pressure held for 20 minutes  right DP pulse palpable +2   Post sheath removal  VS stable 112/70 SR 98   Site Level 0  Stephanie RN assessed and agree   Right DP pulse palpable +2   Post sheath removal instructions not given ,Patient is intubated and sedated  Laney PastorStephanie RN will continue to monitor

## 2015-01-10 NOTE — Progress Notes (Signed)
Initial Nutrition Assessment  DOCUMENTATION CODES:  Obesity unspecified  INTERVENTION:  Initiate Vital High Protein @ 20 ml/hr via NG tube and increase by 10 ml every 4 hours to goal rate of 50 ml/hr.   30 ml Prostat BID.    Tube feeding regimen provides 1400 kcal (14 kcal/kg of actual weight), 135 grams of protein, and 1003 ml of H2O.    NUTRITION DIAGNOSIS:  Inadequate oral intake related to inability to eat as evidenced by NPO status.   GOAL:  Provide needs based on ASPEN/SCCM guidelines   MONITOR:  TF tolerance, Vent status, Labs, I & O's, Weight trends  REASON FOR ASSESSMENT:  Consult Enteral/tube feeding initiation and management  ASSESSMENT: Pt admitted with VF arrest while playing soccer 6/5. S/P cath with drug-eluting stent. Pt rewarmed 6/7 at 10:30.  Consulted to start enteral nutrition.   Patient is currently intubated on ventilator support MV: 8.8 L/min Temp (24hrs), Avg:95 F (35 C), Min:89.8 F (32.1 C), Max:100 F (37.8 C)  NG tube tip in distal stomach. Labs reviewed: cbg's: 100-120; BUN/Cr elevated Spoke with son who speaks english. Per son no recent weight changes, good appetite and occasional ETOH PTA.  Nutrition-Focused physical exam completed. No fat or muscle depletion noted.    Height:  Ht Readings from Last 1 Encounters:  No data found for Ht  5'7" (170.2 cm)  Weight:  Wt Readings from Last 1 Encounters:  01/10/15 215 lb 6.2 oz (97.7 kg)    Ideal Body Weight:  67.2 kg  Wt Readings from Last 10 Encounters:  01/10/15 215 lb 6.2 oz (97.7 kg)    BMI:  Body mass index is 33.73 kg/(m^2).  Estimated Nutritional Needs:  Kcal:  1100-1400  Protein:  134  Fluid:  > 1.5 L/day  Skin:  Reviewed, no issues  Diet Order:    NPO  EDUCATION NEEDS:  No education needs identified at this time   Intake/Output Summary (Last 24 hours) at 01/10/15 1627 Last data filed at 01/10/15 1600  Gross per 24 hour  Intake 1433.29 ml   Output    950 ml  Net 483.29 ml    Last BM:  PTA  Kendell BaneHeather Aracely Rickett RD, LDN, CNSC [phone] Pager [phone] After Hours Pager

## 2015-01-10 NOTE — Progress Notes (Signed)
Began active rewarming at this time per protocol, 0.3/hr.  Setting check by myself and second RN, Lexi.

## 2015-01-10 NOTE — Progress Notes (Signed)
PULMONARY / CRITICAL CARE MEDICINE   Name: Alexander Berry MRN: [id number] DOB: 01/07/1966    ADMISSION DATE:  01/08/2015 CONSULTATION DATE:  01/08/2015    PT PROFILE:   VF arrest while playing soccer 6/05. Prolonged ACLS. DES to RCA. Hypothermia protocol  MAJOR EVENTS/TEST RESULTS: 6/05 Admitted after OOH arrest 6/05 LHC: Dist RCA lesion, 80% stenosed. Mid RCA lesion, 100% stenosed. There is a 0% residual stenosis post intervention. A drug-eluting stent was placed. 2nd RPLB-1 lesion, 100% stenosed. 2nd RPLB-2 lesion, 100% stenosed. There is a 0% residual stenosis post intervention. RPDA lesion, 80% stenosed. There is a 0% residual stenosis post intervention.  6/06 EEG: Abnormal EEG due to the presence of a generalized slowing indicating a mild to moderate cerebral disturbance (encephalopathy). No epileptiform activity noted 6/06 TTE:   6/07 Rewarmed. Continuous sedatives stopped. RASS -5. No spont movement 6/07 CT head:   INDWELLING DEVICES:: ETT 6/05 >>  R IJ CVL 6/05 >>  Femoral sheath 6/05 >> 6/07  MICRO DATA:   ANTIMICROBIALS:    SUBJ: Rewarmed. Remains on continuous sedatives. RASS -5. No spont movement   VITAL SIGNS: Temp:  [89.4 F (31.9 C)-98.6 F (37 C)] 98.6 F (37 C) (06/07 1030) Pulse Rate:  [42-104] 104 (06/07 1030) Resp:  [14-16] 16 (06/07 1030) BP: (130-171)/(69-86) 137/69 mmHg (06/07 0835) SpO2:  [98 %-100 %] 98 % (06/07 1030) Arterial Line BP: (122-191)/(61-95) 177/87 mmHg (06/07 1030) FiO2 (%):  [40 %-70 %] 40 % (06/07 0812) Weight:  [97.7 kg (215 lb 6.2 oz)] 97.7 kg (215 lb 6.2 oz) (06/07 0600) HEMODYNAMICS: CVP:  [6 mmHg-9 mmHg] 9 mmHg VENTILATOR SETTINGS: Vent Mode:  [-] PRVC FiO2 (%):  [40 %-70 %] 40 % Set Rate:  [14 bmp] 14 bmp Vt Set:  [620 mL] 620 mL PEEP:  [5 cmH20] 5 cmH20 Plateau Pressure:  [21 cmH20-23 cmH20] 22 cmH20 INTAKE / OUTPUT:  Intake/Output Summary (Last 24 hours) at 01/10/15 1105 Last data filed at 01/10/15 1000  Gross per  24 hour  Intake 1717.65 ml  Output    745 ml  Net 972.65 ml    PHYSICAL EXAMINATION: General: RASS -5, not F/C Neuro: pupils pinpoint, no spont movement, spont resp effort present, no withdrawal HEENT: NCAT, WNL Cardiovascular:  Reg, no M Lungs: slightly coarse, no wheezes Abdomen: soft, diminished BS Ext: warm, no edema  LABS:  PULMONARY  Recent Labs Lab 01/08/15 2106 01/09/15 0340 01/09/15 0741 01/09/15 1229 01/09/15 1611  PHART  --  7.215*  --   --   --   PCO2ART  --  36.9  --   --   --   PO2ART  --  292*  --   --   --   HCO3  --  14.4*  --   --   --   TCO2 12 15.5 15 15 16   O2SAT  --  99.2  --   --   --     CBC  Recent Labs Lab 01/08/15 1515  01/09/15 0050  01/09/15 1229 01/09/15 1611 01/10/15 0420  HGB 13.7  < > 13.0  < > 13.6 12.9* 11.4*  HCT 43.0  < > 40.4  < > 40.0 38.0* 34.4*  WBC 6.8  --  13.7*  --   --   --  9.7  PLT 231  --  280  --   --   --  149*  < > = values in this interval not displayed.  COAGULATION  Recent Labs Lab  01/08/15 1515 01/08/15 1845 01/08/15 2300  INR 1.54* >10.00* 1.70*    CARDIAC    Recent Labs Lab 01/08/15 1845 01/08/15 2300 01/09/15 0050 01/09/15 0751 01/09/15 1200  TROPONINI 13.65* 58.08* 62.31* >65.00* >65.00*   No results for input(s): PROBNP in the last 168 hours.   CHEMISTRY  Recent Labs Lab 01/09/15 0445  01/09/15 1611 01/09/15 2006 01/10/15 0005 01/10/15 0420 01/10/15 0735  NA 140  < > 138 137 137 141 139  K 4.0  < > 4.8 4.9 4.6 4.0 4.4  CL 113*  < > 115* 113* 112* 112* 113*  CO2 18*  --   --  17* 18* 20* 20*  GLUCOSE 154*  < > 163* 155* 124* 109* 103*  BUN 19  < > 23* 24* 21* 23* 22*  CREATININE 2.11*  < > 2.00* 1.91* 1.96* 1.95* 1.91*  CALCIUM 7.8*  --   --  8.0* 8.1* 8.2* 7.9*  MG 2.2  --   --   --   --   --   --   PHOS 2.3*  --   --   --   --   --   --   < > = values in this interval not displayed. Estimated Creatinine Clearance: 53.2 mL/min (by C-G formula based on Cr of  1.91).   LIVER  Recent Labs Lab 01/08/15 1515 01/08/15 1845 01/08/15 2300 01/09/15 0445  AST 848*  --   --  1757*  ALT 1428*  --   --  1352*  ALKPHOS 70  --   --  53  BILITOT 0.6  --   --  0.8  PROT 6.2*  --   --  6.1*  ALBUMIN 3.8  --   --  3.4*  INR 1.54* >10.00* 1.70*  --      INFECTIOUS  Recent Labs Lab 01/08/15 1920  LATICACIDVEN 9.3*     ENDOCRINE CBG (last 3)   Recent Labs  01/09/15 2344 01/10/15 0412 01/10/15 0728  GLUCAP 115* 88 100*     CXR: edema pattern    ASSESSMENT / PLAN: CARDIOVASCULAR A:  STEMI Out of hospital VF arrest Cardiogenic shock, resolved Hypertension P:  Mgmt per Cards PRN hydralazine to maitnain SBP < 160 mmHg PRN metoprolol to maintain HR < 100/min Consider scheduled metoprolol Recheck EKG in AM 6/07 F/U echocardiogram done 6/06  PULMONARY A:  VDRF post arrest Pulm edema P:   Cont full vent support - settings reviewed and/or adjusted Cont vent bundle Daily SBT if/when meets criteria Lasix X 1  RENAL A:  AKI, nonoliguric Hypokalemia, resolved P:   Monitor BMET intermittently Monitor I/Os Correct electrolytes as indicated  GASTROINTESTINAL A:   Elevated LFT - suspect shock liver P:   SUP: enteral famotidine Begin TFs 6/07 Monitor LFTs intermittently - recheck 6/08  HEMATOLOGIC A:   No issues P:  DVT px: Brilinta Monitor CBC intermittently Transfuse per usual ICU guidelines  INFECTIOUS A:   No evidence of infection P:   Monitor temp, WBC count Micro and abx as above  ENDOCRINE A:   Hyperglycemia P:   Cont SSI  NEUROLOGIC A:   Anoxic encephalopathy P:   RASS goal: -1,-2 Transition to intermittent sedation 6/07 Daily WUA   FAMILY  Family updated in detail 6/07  CCM X 40 mins   Billy Fischer, MD ; [facility] service Mobile [phone].  After 5:30 PM or weekends, call [phone]

## 2015-01-10 NOTE — Progress Notes (Signed)
eLink Physician-Brief Progress Note Patient Name: Alexander Berry DOB: 02/22/1966 MRN: [id number]   Date of Service  01/10/2015  HPI/Events of Note  Delirium.  eICU Interventions  Will increase Fentanyl and Versed PRN to Q 1 hour.      Intervention Category Major Interventions: Delirium, psychosis, severe agitation - evaluation and management  Jelisa  Eugene 01/10/2015, 9:03 PM

## 2015-01-10 NOTE — Progress Notes (Signed)
Patient ID: Alexander Berry, male   DOB: [birth year], 49 y.o.   MRN: [id number]    Subjective:  Intubated sedated and warming   Objective:  Filed Vitals:   01/10/15 0500 01/10/15 0600 01/10/15 0700 01/10/15 0730  BP:      Pulse: 80 77 79 87  Temp: 95.5 F (35.3 C) 95.2 F (35.1 C)  95.4 F (35.2 C)  TempSrc: Core (Comment) Core (Comment)  Core (Comment)  Resp: Height:      Weight:      SpO2: 100% 100% 100% 100%    Intake/Output from previous day:  Intake/Output Summary (Last 24 hours) at 01/10/15 0814 Last data filed at 01/10/15 0700  Gross per 24 hour  Intake 1830.15 ml  Output    735 ml  Net 1095.15 ml    Physical Exam: Sedated  Hispanic male  HEENT: Right IJ catheter  Neck supple with no adenopathy JVP normal no bruits no thyromegaly Lungs clear with no wheezing and good diaphragmatic motion Heart:  S1/S2 no murmur, no rub, gallop or click PMI normal Abdomen: benighn, BS positve, no tenderness, no AAA no bruit.  No HSM or HJR  Right femoral catheter  Distal pulses intact with no bruits Warming pads on    Lab Results: Basic Metabolic Panel:  Recent Labs  40/98/11 0445  01/10/15 0005 01/10/15 0420  NA 140  < > 137 141  K 4.0  < > 4.6 4.0  CL 113*  < > 112* 112*  CO2 18*  < > 18* 20*  GLUCOSE 154*  < > 124* 109*  BUN 19  < > 21* 23*  CREATININE 2.11*  < > 1.96* 1.95*  CALCIUM 7.8*  < > 8.1* 8.2*  MG 2.2  --   --   --   PHOS 2.3*  --   --   --   < > = values in this interval not displayed. Liver Function Tests:  Recent Labs  01/08/15 1515 01/09/15 0445  AST 848* 1757*  ALT 1428* 1352*  ALKPHOS 70 53  BILITOT 0.6 0.8  PROT 6.2* 6.1*  ALBUMIN 3.8 3.4*   CBC:  Recent Labs  01/09/15 0050  01/09/15 1611 01/10/15 0420  WBC 13.7*  --   --  9.7  HGB 13.0  < > 12.9* 11.4*  HCT 40.4  < > 38.0* 34.4*  MCV 86.0  --   --  83.5  PLT 280  --   --  149*  < > = values in this interval not displayed. Cardiac Enzymes:  Recent Labs  01/09/15 0050 01/09/15 0751 01/09/15 1200  TROPONINI 62.31* >65.00* >65.00*    Imaging: Dg Chest Port 1 View  01/10/2015   CLINICAL DATA:  Respiratory failure.  Shortness of breath.  EXAM: PORTABLE CHEST - 1 VIEW  COMPARISON:  01/08/2015.  FINDINGS: Endotracheal tube, NG tube, right IJ line in stable position. Stable cardiomegaly. Diffuse bilateral pulmonary alveolar infiltrates are unchanged. No pleural effusion or pneumothorax.  IMPRESSION: 1. Lines and tubes in stable position. 2. Cardiomegaly with diffuse bilateral pulmonary infiltrates consistent with pulmonary edema and/or bilateral pneumonia. No interim change.   Electronically Signed   By: Maisie Fus  Register   On: 01/10/2015 07:13   Dg Chest Port 1 View  01/09/2015   CLINICAL DATA:  RIGHT central venous line placement.  EXAM: PORTABLE CHEST - 1 VIEW  COMPARISON:  Portable film earlier in the day.  FINDINGS: Unchanged endotracheal tube. RIGHT IJ catheter  has been placed and lies with its tip in the proximal SVC. No visible pneumothorax. BILATERAL airspace opacities persist without significant improvement. Retrocardiac density remains concerning for LEFT lower lobe atelectasis or infiltrate. Orogastric tube choroidal than stomach. Stable cardiomediastinal silhouette.  IMPRESSION: RIGHT IJ catheter proximal SVC. No pneumothorax. It is stable airspace opacities and retrocardiac density.   Electronically Signed   By: Davonna Belling M.D.   On: 01/09/2015 00:34   Dg Chest Port 1 View  01/08/2015   CLINICAL DATA:  Endotracheal and nasogastric tube placement.  EXAM: PORTABLE CHEST - 1 VIEW  COMPARISON:  Earlier the same date.  FINDINGS: 1836 hours. The endotracheal tube is unchanged within the mid trachea. A nasogastric tube projects below the diaphragm. The heart size and mediastinal contours are stable. There are worsening bilateral perihilar airspace opacities with some volume loss in the retrocardiac left lower lobe. No pneumothorax or significant pleural  effusion identified. No evidence of rib fracture.  IMPRESSION: The endotracheal and nasogastric tubes remain satisfactorily positioned. Worsening bilateral airspace opacities.   Electronically Signed   By: Carey Bullocks M.D.   On: 01/08/2015 18:52   Dg Chest Port 1 View  01/08/2015   CLINICAL DATA:  Cardiopulmonary arrest playing soccer. Initial encounter.  EXAM: PORTABLE CHEST - 1 VIEW  COMPARISON:  None.  FINDINGS: 1532 hours. Endotracheal tube tip is in the midtrachea, 3.0 cm above the carina. A nasogastric tube extends into the stomach, tip not visualized. No definite central venous catheter identified. There are numerous telemetry leads overlying the chest. There are low lung volumes with left-greater-than-right perihilar airspace opacities. No pleural effusion or pneumothorax identified. The heart size mediastinal contours are normal allowing for mild patient rotation. No rib fractures identified.  IMPRESSION: Endotracheal and nasogastric tubes appears satisfactorily positioned. Left-greater-than-right perihilar pulmonary opacities may reflect edema or aspiration. No evidence of rib fracture or pneumothorax.   Electronically Signed   By: Carey Bullocks M.D.   On: 01/08/2015 15:44   Dg Abd Portable 1v  01/08/2015   CLINICAL DATA:  Endotracheal and nasogastric tube placement.  EXAM: PORTABLE ABDOMEN - 1 VIEW  COMPARISON:  Chest radiograph same date.  FINDINGS: 1840 hours. Nasogastric tube extends into the distal stomach. There are minimally prominent mid small bowel loops. There is gas within the stomach and colon. No bowel wall thickening or supine evidence of free intraperitoneal air. No acute osseous abnormalities identified.  IMPRESSION: Nasogastric tube tip in the distal stomach. Mild nonspecific small bowel dilatation.   Electronically Signed   By: Carey Bullocks M.D.   On: 01/08/2015 18:54    Cardiac Studies:  ECG:  SR rate 50 evolving IMI with persistent lateral ST depression    Telemetry:  SR no VT warming 01/10/2015   Echo: report pending   Medications:   . antiseptic oral rinse  7 mL Mouth Rinse QID  . artificial tears  1 application Both Eyes 3 times per day  . aspirin EC  81 mg Oral Daily  . chlorhexidine  15 mL Mouth Rinse BID  . insulin aspart  2-6 Units Subcutaneous 6 times per day  . insulin glargine  20 Units Subcutaneous Q24H  . pantoprazole (PROTONIX) IV  40 mg Intravenous QHS  . sodium chloride  3 mL Intravenous Q12H  . THROMBI-PAD  1 each Topical Once  . ticagrelor  90 mg Oral BID     . sodium chloride 10 mL/hr at 01/10/15 0700  . sodium chloride    . amiodarone  30 mg/hr (01/10/15 0700)  . cisatracurium (NIMBEX) infusion 1.5 mcg/kg/min (01/10/15 0700)  . dextrose    . fentaNYL infusion INTRAVENOUS 275 mcg/hr (01/10/15 0708)  . insulin (NOVOLIN-R) infusion Stopped (01/09/15 1718)  . midazolam (VERSED) infusion 6 mg/hr (01/10/15 0700)  . norepinephrine (LEVOPHED) Adult infusion 1 mcg/min (01/10/15 0730)    Assessment/Plan:  IMI:  Complicated by cardiac arrest Long area stenting mid RCA Continue ticagrelor.  Hemodynamic support with levophed still  Echo to assess RV/LV function Troponins very high Nurse will call cath lab to get RFA sheath out once off levophed  Cardiac Arrest  Coolong protocol  Neuro status in doubt  Continue amiodarone for ectopy and cardiac arrest   CCM:  Care per for warming access and vent   Charlton HawsPeter Nishan 01/10/2015, 8:14 AM

## 2015-01-11 ENCOUNTER — Inpatient Hospital Stay (HOSPITAL_COMMUNITY): Payer: Medicaid Other

## 2015-01-11 ENCOUNTER — Other Ambulatory Visit (HOSPITAL_COMMUNITY): Payer: Self-pay

## 2015-01-11 DIAGNOSIS — I509 Heart failure, unspecified: Secondary | ICD-10-CM

## 2015-01-11 LAB — CBC
HCT: 30.9 % — ABNORMAL LOW (ref 39.0–52.0)
Hemoglobin: 10 g/dL — ABNORMAL LOW (ref 13.0–17.0)
MCH: 27.8 pg (ref 26.0–34.0)
MCHC: 32.4 g/dL (ref 30.0–36.0)
MCV: 85.8 fL (ref 78.0–100.0)
PLATELETS: 187 10*3/uL (ref 150–400)
RBC: 3.6 MIL/uL — ABNORMAL LOW (ref 4.22–5.81)
RDW: 14.8 % (ref 11.5–15.5)
WBC: 9.8 10*3/uL (ref 4.0–10.5)

## 2015-01-11 LAB — BASIC METABOLIC PANEL
Anion gap: 9 (ref 5–15)
BUN: 31 mg/dL — AB (ref 6–20)
CO2: 21 mmol/L — ABNORMAL LOW (ref 22–32)
Calcium: 8.1 mg/dL — ABNORMAL LOW (ref 8.9–10.3)
Chloride: 110 mmol/L (ref 101–111)
Creatinine, Ser: 2.17 mg/dL — ABNORMAL HIGH (ref 0.61–1.24)
GFR calc Af Amer: 40 mL/min — ABNORMAL LOW (ref >60)
GFR calc non Af Amer: 34 mL/min — ABNORMAL LOW (ref >60)
GLUCOSE: 125 mg/dL — AB (ref 65–99)
Potassium: 4.7 mmol/L (ref 3.5–5.1)
Sodium: 140 mmol/L (ref 135–145)

## 2015-01-11 LAB — GLUCOSE, CAPILLARY
GLUCOSE-CAPILLARY: 121 mg/dL — AB (ref 65–99)
GLUCOSE-CAPILLARY: 137 mg/dL — AB (ref 65–99)
GLUCOSE-CAPILLARY: 102 mg/dL — AB (ref 65–99)
GLUCOSE-CAPILLARY: 92 mg/dL (ref 65–99)
GLUCOSE-CAPILLARY: 127 mg/dL — AB (ref 65–99)
Glucose-Capillary: 126 mg/dL — ABNORMAL HIGH (ref 65–99)
Glucose-Capillary: 101 mg/dL — ABNORMAL HIGH (ref 65–99)

## 2015-01-11 LAB — HEPATIC FUNCTION PANEL
ALT: 680 U/L — ABNORMAL HIGH (ref 17–63)
AST: 465 U/L — ABNORMAL HIGH (ref 15–41)
Albumin: 3.1 g/dL — ABNORMAL LOW (ref 3.5–5.0)
Alkaline Phosphatase: 53 U/L (ref 38–126)
BILIRUBIN INDIRECT: 1 mg/dL — AB (ref 0.3–0.9)
BILIRUBIN TOTAL: 1.2 mg/dL (ref 0.3–1.2)
Bilirubin, Direct: 0.2 mg/dL (ref 0.1–0.5)
TOTAL PROTEIN: 6.1 g/dL — AB (ref 6.5–8.1)

## 2015-01-11 LAB — PROCALCITONIN: PROCALCITONIN: 7.46 ng/mL

## 2015-01-11 LAB — TROPONIN I: Troponin I: 57.38 ng/mL (ref <0.031)

## 2015-01-11 MED ORDER — FENTANYL BOLUS VIA INFUSION: 50.0000 ug | INTRAVENOUS | Status: DC | PRN

## 2015-01-11 MED ORDER — FUROSEMIDE 10 MG/ML IJ SOLN
INTRAMUSCULAR | Status: AC
  Administered 2015-01-11: 40 mg
  Filled 2015-01-11: qty 4

## 2015-01-11 MED ORDER — DEXTROSE 5 % IV SOLN
2.0000 g | Freq: Two times a day (BID) | INTRAVENOUS | Status: DC
  Administered 2015-01-11 – 2015-01-16 (×10): 2 g via INTRAVENOUS
  Filled 2015-01-11 (×11): qty 2

## 2015-01-11 MED ORDER — FUROSEMIDE 10 MG/ML IJ SOLN
40.0000 mg | Freq: Once | INTRAMUSCULAR | Status: AC
  Administered 2015-01-11: 40 mg via INTRAVENOUS

## 2015-01-11 MED ORDER — FUROSEMIDE 10 MG/ML IJ SOLN: 40.0000 mg | Freq: Once | INTRAMUSCULAR | Status: AC

## 2015-01-11 MED ORDER — SODIUM CHLORIDE 0.9 % IV SOLN
0.0000 ug/h | INTRAVENOUS | Status: DC
  Administered 2015-01-11: 150 ug/h via INTRAVENOUS
  Administered 2015-01-11: 50 ug/h via INTRAVENOUS
  Filled 2015-01-11 (×3): qty 50

## 2015-01-11 MED ORDER — VANCOMYCIN HCL IN DEXTROSE 750-5 MG/150ML-% IV SOLN
750.0000 mg | Freq: Two times a day (BID) | INTRAVENOUS | Status: DC
  Administered 2015-01-11 – 2015-01-13 (×4): 750 mg via INTRAVENOUS
  Filled 2015-01-11 (×5): qty 150

## 2015-01-11 MED ORDER — FENTANYL CITRATE (PF) 100 MCG/2ML IJ SOLN: 50.0000 ug | Freq: Once | INTRAMUSCULAR | Status: DC

## 2015-01-11 MED ORDER — FUROSEMIDE 10 MG/ML IJ SOLN
40.0000 mg | Freq: Every day | INTRAMUSCULAR | Status: DC
  Filled 2015-01-11: qty 4

## 2015-01-11 NOTE — Progress Notes (Signed)
eLink Physician-Brief Progress Note Patient Name: Alexander Berry DOB: 09/14/1965 MRN: [id number]   Date of Service  01/11/2015  HPI/Events of Note  Hypoxemia. Now on 100%/P 5 with sat = 85%. CXR from this AM with increased in airspace disease.   eICU Interventions  Will order: 1. Increase PEEP to 10. 2. Please do recruitment maneuver.      Intervention Category Major Interventions: Hypoxemia - evaluation and management  Sommer,Steven Dennard Nipugene 01/11/2015, 7:51 PM

## 2015-01-11 NOTE — Progress Notes (Signed)
  Echocardiogram 2D Echocardiogram has been performed.  Janalyn HarderWest, Andrue Dini R 01/11/2015, 4:57 PM

## 2015-01-11 NOTE — Progress Notes (Signed)
Patient ID: Alexander Berry, male   DOB: June 05, 1966, 49 y.o.   MRN: [id number]    Subjective:  Intubated sedated and cooled Updated daughter and wife Sats poor this am  Lasix given  Cooling to maintain temp 48 hrs   Objective:  Filed Vitals:   01/11/15 0600 01/11/15 0700 01/11/15 0740 01/11/15 0800  BP: 153/80 126/68 126/68 149/80  Pulse: 98 94 102 105  Temp: 98.8 F (37.1 C) 99.1 F (37.3 C)  98.8 F (37.1 C)  TempSrc: Core (Comment) Core (Comment)  Core (Comment)  Resp: Height:      Weight:      SpO2: 97% 95%  90%    Intake/Output from previous day:  Intake/Output Summary (Last 24 hours) at 01/11/15 0810 Last data filed at 01/11/15 0700  Gross per 24 hour  Intake 1106.57 ml  Output   1090 ml  Net  16.57 ml    Physical Exam: Sedated  Hispanic male  HEENT:  right IJ catheter  NG tube  Neck supple with no adenopathy JVP normal no bruits no thyromegaly Lungs clear with no wheezing and good diaphragmatic motion Heart:  S1/S2 no murmur, no rub, gallop or click PMI normal Abdomen: benighn, BS positve, no tenderness, no AAA no bruit.  No HSM or HJR  RFA catheter removed no hematoma  Distal pulses intact with no bruits Cooled    Lab Results: Basic Metabolic Panel:  Recent Labs  95/62/13 0445  01/10/15 0735 01/11/15 0435  NA 140  < > 139 140  K 4.0  < > 4.4 4.7  CL 113*  < > 113* 110  CO2 18*  < > 20* 21*  GLUCOSE 154*  < > 103* 125*  BUN 19  < > 22* 31*  CREATININE 2.11*  < > 1.91* 2.17*  CALCIUM 7.8*  < > 7.9* 8.1*  MG 2.2  --   --   --   PHOS 2.3*  --   --   --   < > = values in this interval not displayed. Liver Function Tests:  Recent Labs  01/09/15 0445 01/11/15 0435  AST 1757* 465*  ALT 1352* 680*  ALKPHOS 53 53  BILITOT 0.8 1.2  PROT 6.1* 6.1*  ALBUMIN 3.4* 3.1*   CBC:  Recent Labs  01/10/15 0420 01/11/15 0435  WBC 9.7 9.8  HGB 11.4* 10.0*  HCT 34.4* 30.9*  MCV 83.5 85.8  PLT 149* 187   Cardiac Enzymes:  Recent  Labs  01/09/15 0751 01/09/15 1200 01/11/15 0435  TROPONINI >65.00* >65.00* 57.38*    Imaging: Ct Head Wo Contrast  01/10/2015   CLINICAL DATA:  Collapsed during soccer game on Sunday.  EXAM: CT HEAD WITHOUT CONTRAST  TECHNIQUE: Contiguous axial images were obtained from the base of the skull through the vertex without intravenous contrast.  COMPARISON:  None.  FINDINGS: No acute intracranial abnormality. Specifically, no hemorrhage, hydrocephalus, mass lesion, acute infarction, or significant intracranial injury. No acute calvarial abnormality.  IMPRESSION: No acute intracranial abnormality.   Electronically Signed   By: Charlett Nose M.D.   On: 01/10/2015 18:40   Dg Chest Port 1 View  01/11/2015   CLINICAL DATA:  Respiratory failure  EXAM: PORTABLE CHEST - 1 VIEW  COMPARISON:  01/10/2015  FINDINGS: The endotracheal tube tip is 4.3 cm above the carina. The right jugular central line extends into the SVC. The nasogastric tube extends into the stomach and off the inferior edge of the image.  There are worsened central airspace opacities bilaterally, now with more confluent consolidation. There is no pneumothorax. There is no large effusion.  IMPRESSION: Support equipment appears satisfactorily positioned.  Worsened central airspace opacities.   Electronically Signed   By: Ellery Plunkaniel R Mitchell M.D.   On: 01/11/2015 06:55   Dg Chest Port 1 View  01/10/2015   CLINICAL DATA:  Respiratory failure.  Shortness of breath.  EXAM: PORTABLE CHEST - 1 VIEW  COMPARISON:  01/08/2015.  FINDINGS: Endotracheal tube, NG tube, right IJ line in stable position. Stable cardiomegaly. Diffuse bilateral pulmonary alveolar infiltrates are unchanged. No pleural effusion or pneumothorax.  IMPRESSION: 1. Lines and tubes in stable position. 2. Cardiomegaly with diffuse bilateral pulmonary infiltrates consistent with pulmonary edema and/or bilateral pneumonia. No interim change.   Electronically Signed   By: Maisie Fushomas  Register   On:  01/10/2015 07:13    Cardiac Studies:  ECG:  SR rate 50 evolving IMI with persistent lateral ST depression    Telemetry: Sinus bradycardia PVC;s no NSVT    Echo:  6/6  EF 55-60% mild MR reviewed   Medications:   . antiseptic oral rinse  7 mL Mouth Rinse QID  . aspirin  81 mg Per Tube Daily  . chlorhexidine  15 mL Mouth Rinse BID  . famotidine  20 mg Per Tube Daily  . feeding supplement (PRO-STAT SUGAR FREE 64)  30 mL Per Tube BID  . fentaNYL (SUBLIMAZE) injection  50 mcg Intravenous Once  . insulin aspart  0-15 Units Subcutaneous 6 times per day  . sodium chloride  3 mL Intravenous Q12H  . THROMBI-PAD  1 each Topical Once  . ticagrelor  90 mg Per Tube BID     . sodium chloride    . amiodarone 30 mg/hr (01/10/15 2244)  . feeding supplement (VITAL HIGH PROTEIN) 1,000 mL (01/11/15 0615)  . fentaNYL infusion INTRAVENOUS 100 mcg/hr (01/11/15 0330)    Assessment/Plan:  IMI:  Complicated by cardiac arrest Long area stenting mid RCA Continue ticagrelor.    Echo with normal RV/LV function Troponins very high Will stop amiodarone at this point   Cardiac Arrest  Coolong protocol  Neuro status in doubt  resedated due to pulmonary issues   CCM:  Care per for cooling access and vent  F/U CXR ? ARDS higher oxygen requirements  Will write for daily lasix to keep lungs as dry as possible  [name] 01/11/2015, 8:10 AM

## 2015-01-11 NOTE — Progress Notes (Signed)
Pts morning EKG with ST elevation, pt also with decreased O2 saturations and pink sputum, CVP 18.  Theodore Demarkhonda Barrett PA notified, aware and orders recieved

## 2015-01-11 NOTE — Progress Notes (Signed)
Performed recruitment on patient Sats are staying in the 80's 87-89.. MD. Came in on camera and ordered to increase peep to 10.  Lavaged and suctioned patient .Marland Kitchen. Moved bite block and ETT to left side . RT will continue to monitor.

## 2015-01-11 NOTE — Progress Notes (Signed)
WIS- 170 Fentanyl, 45 versed; Witnessed by Alycia Rossettiyan, Charity fundraiserN.

## 2015-01-11 NOTE — Progress Notes (Signed)
Called by RN for CVP 18 and pink sputum. ECG abnl also, but not much different from previous. No chest pain.  Will give Lasix 40 mg IV x 1 and advise MD.  Theodore DemarkBarrett, Alaynna Kerwood, PA-C 01/11/2015 7:41 AM Beeper [phone]

## 2015-01-11 NOTE — Progress Notes (Signed)
**Note De-Identified  Obfuscation** SAT 88% on 100% FIO2 and +5 of PEEP.  Recruitment maneuver tolerated fairly well per anxiety.  RT to continue to monitor.

## 2015-01-11 NOTE — Progress Notes (Signed)
PULMONARY / CRITICAL CARE MEDICINE   Name: Alexander Berry MRN: [id number] DOB: 05/31/1966    ADMISSION DATE:  01/08/2015 CONSULTATION DATE:  01/08/2015    PT PROFILE:   VF arrest while playing soccer 6/05. Prolonged ACLS. DES to RCA. Hypothermia protocol  MAJOR EVENTS/TEST RESULTS: 6/05 Admitted after OOH arrest 6/05 LHC: Dist RCA lesion, 80% stenosed. Mid RCA lesion, 100% stenosed. There is a 0% residual stenosis post intervention. A drug-eluting stent was placed. 2nd RPLB-1 lesion, 100% stenosed. 2nd RPLB-2 lesion, 100% stenosed. There is a 0% residual stenosis post intervention. RPDA lesion, 80% stenosed. There is a 0% residual stenosis post intervention.  6/06 EEG: Abnormal EEG due to the presence of a generalized slowing indicating a mild to moderate cerebral disturbance (encephalopathy). No epileptiform activity noted 6/06 TTE: The estimated ejection fraction was in the range of 55% to 60%. Mild posterior wall hypokinesis - decreased Definity microsphere uptake in the posterior wall suggests hypoperfusion  6/07 Rewarmed. Continuous sedatives stopped. RASS -5. No spont movement 6/07 CT head: NAD 6/07 repeat EEG: Abnormal EEG due to the presence of a generalized slowing indicating a mild to moderate cerebral disturbance (encephalopathy). No epileptiform activity noted 6/08 Increased agitation requiring sedation. Not F/C. Increased O2 reqts. Frothy pink sputum. Resp culture obatined. PCT elevated. Empiric abx initiated. CXR c/w increased pulm edema. Hypertensive. Furosemide ordered  INDWELLING DEVICES: Femoral sheath 6/05 >> 6/07 ETT 6/05 >>  R IJ CVL 6/05 >>    MICRO DATA: Resp 6/08 >>   PCT 6/08: 7.46           6/09:     ANTIMICROBIALS:  Vanc 6/08 >>  Ceftazidime 6/08 >>   SUBJ: RASS -4, not F/C. receiving fent gtt initiated 6/07 for vent dysynchrony   VITAL SIGNS: Temp:  [96.8 F (36 C)-100.6 F (38.1 C)] 98.6 F (37 C) (06/08 1200) Pulse Rate:  [90-107] 107  (06/08 1500) Resp:  [11-29] 14 (06/08 1500) BP: (104-160)/(61-113) 145/73 mmHg (06/08 1500) SpO2:  [90 %-100 %] 92 % (06/08 1500) Arterial Line BP: (105-114)/(60-63) 105/60 mmHg (06/07 1630) FiO2 (%):  [40 %-100 %] 100 % (06/08 1200) HEMODYNAMICS: CVP:  [12 mmHg-18 mmHg] 14 mmHg VENTILATOR SETTINGS: Vent Mode:  [-] PRVC FiO2 (%):  [40 %-100 %] 100 % Set Rate:  [14 bmp] 14 bmp Vt Set:  [600 mL] 600 mL PEEP:  [5 cmH20] 5 cmH20 Plateau Pressure:  [15 cmH20-32 cmH20] 15 cmH20 INTAKE / OUTPUT:  Intake/Output Summary (Last 24 hours) at 01/11/15 1550 Last data filed at 01/11/15 1500  Gross per 24 hour  Intake 1417.49 ml  Output   3020 ml  Net -1602.51 ml    PHYSICAL EXAMINATION: General: RASS -4, not F/C Neuro: MAEs HEENT: NCAT, WNL Cardiovascular:  Reg, no M Lungs: coarse throughout Abdomen: soft, diminished BS Ext: warm, no LE edema  LABS:  PULMONARY  Recent Labs Lab 01/08/15 2106 01/09/15 0340 01/09/15 0741 01/09/15 1229 01/09/15 1611  PHART  --  7.215*  --   --   --   PCO2ART  --  36.9  --   --   --   PO2ART  --  292*  --   --   --   HCO3  --  14.4*  --   --   --   TCO2 12 15.5 15 15 16   O2SAT  --  99.2  --   --   --     CBC  Recent Labs Lab 01/09/15 0050  01/09/15 1611  01/10/15 0420 01/11/15 0435  HGB 13.0  < > 12.9* 11.4* 10.0*  HCT 40.4  < > 38.0* 34.4* 30.9*  WBC 13.7*  --   --  9.7 9.8  PLT 280  --   --  149* 187  < > = values in this interval not displayed.  COAGULATION  Recent Labs Lab 01/08/15 1515 01/08/15 1845 01/08/15 2300  INR 1.54* >10.00* 1.70*    CARDIAC    Recent Labs Lab 01/08/15 2300 01/09/15 0050 01/09/15 0751 01/09/15 1200 01/11/15 0435  TROPONINI 58.08* 62.31* >65.00* >65.00* 57.38*   No results for input(s): PROBNP in the last 168 hours.   CHEMISTRY  Recent Labs Lab 01/09/15 0445  01/09/15 2006 01/10/15 0005 01/10/15 0420 01/10/15 0735 01/11/15 0435  NA 140  < > 137 137 141 139 140  K 4.0  < >  4.9 4.6 4.0 4.4 4.7  CL 113*  < > 113* 112* 112* 113* 110  CO2 18*  --  17* 18* 20* 20* 21*  GLUCOSE 154*  < > 155* 124* 109* 103* 125*  BUN 19  < > 24* 21* 23* 22* 31*  CREATININE 2.11*  < > 1.91* 1.96* 1.95* 1.91* 2.17*  CALCIUM 7.8*  --  8.0* 8.1* 8.2* 7.9* 8.1*  MG 2.2  --   --   --   --   --   --   PHOS 2.3*  --   --   --   --   --   --   < > = values in this interval not displayed. Estimated Creatinine Clearance: 46.8 mL/min (by C-G formula based on Cr of 2.17).   LIVER  Recent Labs Lab 01/08/15 1515 01/08/15 1845 01/08/15 2300 01/09/15 0445 01/11/15 0435  AST 848*  --   --  1757* 465*  ALT 1428*  --   --  1352* 680*  ALKPHOS 70  --   --  53 53  BILITOT 0.6  --   --  0.8 1.2  PROT 6.2*  --   --  6.1* 6.1*  ALBUMIN 3.8  --   --  3.4* 3.1*  INR 1.54* >10.00* 1.70*  --   --      INFECTIOUS  Recent Labs Lab 01/08/15 1920 01/11/15 0435  LATICACIDVEN 9.3*  --   PROCALCITON  --  7.46     ENDOCRINE CBG (last 3)   Recent Labs  01/11/15 0402 01/11/15 0715 01/11/15 1154  GLUCAP 101* 137* 102*     CXR: increased edema pattern    ASSESSMENT / PLAN: CARDIOVASCULAR A:  STEMI Out of hospital VF arrest Cardiogenic shock, resolved Hypertension P:  Mgmt per Cards PRN hydralazine to maitnain SBP < 160 mmHg PRN metoprolol to maintain HR < 100/min Discussed with Cards - consider repeat Echo given discrepancy btw previous LVEF and severity of pulm edema  PULMONARY A:  VDRF post arrest Pulm edema R/O PNA P:   Cont full vent support - settings reviewed and/or adjusted Cont vent bundle Daily SBT if/when meets criteria repeat furosemide X 2 doses 6/08  RENAL A:  AKI, nonoliguric Hypokalemia, resolved P:   Monitor BMET intermittently Monitor I/Os Correct electrolytes as indicated  GASTROINTESTINAL A:   Shock liver - improving LFTs P:   SUP: enteral famotidine Cont TFs Monitor LFTs intermittently  HEMATOLOGIC A:   Mild anemia without  overt blood loss P:  DVT px: Brilinta Monitor CBC intermittently Transfuse per usual guidelines  INFECTIOUS A:   Elevated PCT  R/O PNA P:   Monitor temp, WBC count Micro and abx as above  ENDOCRINE A:   Hyperglycemia P:   Cont SSI  NEUROLOGIC A:   Anoxic encephalopathy P:   RASS goal: -1,-2 Cont fentanyl gtt Consider dexmedetomidine when closer to extubation Daily WUA   FAMILY  Family updated in detail 6/08  CCM X 45 mins   Billy Fischer, MD ; [facility] service Mobile [phone].  After 5:30 PM or weekends, call [phone]

## 2015-01-11 NOTE — Progress Notes (Signed)
eLink Physician-Brief Progress Note Patient Name: Alexander Berry DOB: 04/21/1966 MRN: [id number]   Date of Service  01/11/2015  HPI/Events of Note  Ongoing agitation despite prn fentanyl/versed.  eICU Interventions  Plan: Place on continuous fentanyl with prns     Intervention Category Major Interventions: Delirium, psychosis, severe agitation - evaluation and management  [name] 01/11/2015, 2:33 AM

## 2015-01-11 NOTE — Progress Notes (Signed)
**Note De-Identified  Obfuscation** Sputum collected and sent to lab.  Patient desaturates with sxn.  Recruitment maneuver tolerated.  RT to continue to monitor.

## 2015-01-11 NOTE — Progress Notes (Signed)
ANTIBIOTIC CONSULT NOTE - INITIAL  Pharmacy Consult for Vancomycin, Ceftazidime Indication: rule out pneumonia  Not on File  Patient Measurements: Height: 5\' 7"  (170.2 cm) Weight: 215 lb 6.2 oz (97.7 kg) IBW/kg (Calculated) : 66.1  Vital Signs: Temp: 98.6 F (37 C) (06/08 1600) Temp Source: Core (Comment) (06/08 1600) BP: 137/67 mmHg (06/08 1600) Pulse Rate: 107 (06/08 1500) Intake/Output from previous day: 06/07 0701 - 06/08 0700 In: 1235.4 [I.V.:627.9; NG/GT:607.5] Out: 1130 [Urine:1130] Intake/Output from this shift: Total I/O In: 684.5 [I.V.:114.5; NG/GT:570] Out: 2300 [Urine:2300]  Labs:  Recent Labs  01/09/15 0050  01/09/15 1611  01/10/15 0420 01/10/15 0735 01/11/15 0435  WBC 13.7*  --   --   --  9.7  --  9.8  HGB 13.0  < > 12.9*  --  11.4*  --  10.0*  PLT 280  --   --   --  149*  --  187  CREATININE 2.17*  < > 2.00*  < > 1.95* 1.91* 2.17*  < > = values in this interval not displayed. Estimated Creatinine Clearance: 46.8 mL/min (by C-G formula based on Cr of 2.17). No results for input(s): VANCOTROUGH, VANCOPEAK, VANCORANDOM, GENTTROUGH, GENTPEAK, GENTRANDOM, TOBRATROUGH, TOBRAPEAK, TOBRARND, AMIKACINPEAK, AMIKACINTROU, AMIKACIN in the last 72 hours.   Microbiology: Recent Results (from the past 720 hour(s))  MRSA PCR Screening     Status: None   Collection Time: 01/09/15  9:45 AM  Result Value Ref Range Status   MRSA by PCR NEGATIVE NEGATIVE Final    Comment:        The GeneXpert MRSA Assay (FDA approved for NASAL specimens only), is one component of a comprehensive MRSA colonization surveillance program. It is not intended to diagnose MRSA infection nor to guide or monitor treatment for MRSA infections.     Medical History: Past Medical History  Diagnosis Date  . Hypertension     Assessment: 49 year old admitted 6/5 with STEMI s/p stent to RCA and VDRF now to begin vancomycin and ceftazidime for rule out pneumonia. Renal function stable,  PCT = 7.46  Goal of Therapy:  Vancomycin trough level 15-20 mcg/ml  Appropriate dosing  Plan:  Ceftazidime 2 grams iv Q 12 hours Vancomycin 750 mg iv Q 12 hours Follow up cultures, renal function, fever curve  Thank you. Okey RegalLisa Seydou Hearns, PharmD [phone]  01/11/2015,4:06 PM

## 2015-01-12 ENCOUNTER — Inpatient Hospital Stay (HOSPITAL_COMMUNITY): Payer: Medicaid Other

## 2015-01-12 DIAGNOSIS — Z01818 Encounter for other preprocedural examination: Secondary | ICD-10-CM

## 2015-01-12 DIAGNOSIS — J189 Pneumonia, unspecified organism: Secondary | ICD-10-CM

## 2015-01-12 LAB — CBC
HCT: 30.6 % — ABNORMAL LOW (ref 39.0–52.0)
Hemoglobin: 10 g/dL — ABNORMAL LOW (ref 13.0–17.0)
MCH: 28.1 pg (ref 26.0–34.0)
MCHC: 32.7 g/dL (ref 30.0–36.0)
MCV: 86 fL (ref 78.0–100.0)
Platelets: 170 10*3/uL (ref 150–400)
RBC: 3.56 MIL/uL — AB (ref 4.22–5.81)
RDW: 14.9 % (ref 11.5–15.5)
WBC: 5.8 10*3/uL (ref 4.0–10.5)

## 2015-01-12 LAB — GLUCOSE, CAPILLARY
GLUCOSE-CAPILLARY: 100 mg/dL — AB (ref 65–99)
GLUCOSE-CAPILLARY: 122 mg/dL — AB (ref 65–99)
GLUCOSE-CAPILLARY: 104 mg/dL — AB (ref 65–99)
Glucose-Capillary: 102 mg/dL — ABNORMAL HIGH (ref 65–99)
Glucose-Capillary: 107 mg/dL — ABNORMAL HIGH (ref 65–99)
Glucose-Capillary: 123 mg/dL — ABNORMAL HIGH (ref 65–99)
Glucose-Capillary: 131 mg/dL — ABNORMAL HIGH (ref 65–99)

## 2015-01-12 LAB — BASIC METABOLIC PANEL
Anion gap: 8 (ref 5–15)
BUN: 42 mg/dL — ABNORMAL HIGH (ref 6–20)
CALCIUM: 8.2 mg/dL — AB (ref 8.9–10.3)
CHLORIDE: 107 mmol/L (ref 101–111)
CO2: 25 mmol/L (ref 22–32)
Creatinine, Ser: 2 mg/dL — ABNORMAL HIGH (ref 0.61–1.24)
GFR calc non Af Amer: 38 mL/min — ABNORMAL LOW (ref >60)
GFR, EST AFRICAN AMERICAN: 44 mL/min — AB (ref >60)
Glucose, Bld: 140 mg/dL — ABNORMAL HIGH (ref 65–99)
Potassium: 4.2 mmol/L (ref 3.5–5.1)
Sodium: 140 mmol/L (ref 135–145)

## 2015-01-12 LAB — PROCALCITONIN: Procalcitonin: 8.9 ng/mL

## 2015-01-12 MED ORDER — DEXMEDETOMIDINE HCL IN NACL 200 MCG/50ML IV SOLN
0.4000 ug/kg/h | INTRAVENOUS | Status: DC
  Administered 2015-01-12 (×3): 0.4 ug/kg/h via INTRAVENOUS
  Administered 2015-01-12: 0.8 ug/kg/h via INTRAVENOUS
  Administered 2015-01-13 (×5): 1.2 ug/kg/h via INTRAVENOUS
  Administered 2015-01-13: 1 ug/kg/h via INTRAVENOUS
  Administered 2015-01-13 (×3): 1.2 ug/kg/h via INTRAVENOUS
  Administered 2015-01-13 (×2): 0.8 ug/kg/h via INTRAVENOUS
  Filled 2015-01-12 (×15): qty 50

## 2015-01-12 MED ORDER — ETOMIDATE 2 MG/ML IV SOLN
INTRAVENOUS | Status: AC
Start: 2015-01-12 — End: 2015-01-12
  Administered 2015-01-12: 20 mg
  Filled 2015-01-12: qty 20

## 2015-01-12 MED ORDER — SODIUM CHLORIDE 0.9 % IV SOLN
0.0000 ug/h | INTRAVENOUS | Status: DC
  Administered 2015-01-12: 50 ug/h via INTRAVENOUS
  Administered 2015-01-13 – 2015-01-14 (×2): 100 ug/h via INTRAVENOUS
  Administered 2015-01-15: 250 ug/h via INTRAVENOUS
  Administered 2015-01-15: 200 ug/h via INTRAVENOUS
  Administered 2015-01-15: 150 ug/h via INTRAVENOUS
  Filled 2015-01-12 (×8): qty 50

## 2015-01-12 MED ORDER — NOREPINEPHRINE BITARTRATE 1 MG/ML IV SOLN
0.0000 ug/min | INTRAVENOUS | Status: DC
  Administered 2015-01-12 – 2015-01-13 (×2): 2 ug/min via INTRAVENOUS
  Filled 2015-01-12 (×2): qty 4

## 2015-01-12 MED ORDER — ROCURONIUM BROMIDE 50 MG/5ML IV SOLN
INTRAVENOUS | Status: AC
  Administered 2015-01-12: 80 mg
  Filled 2015-01-12: qty 2

## 2015-01-12 MED ORDER — FUROSEMIDE 10 MG/ML IJ SOLN
20.0000 mg | Freq: Once | INTRAMUSCULAR | Status: AC
  Administered 2015-01-12: 20 mg via INTRAVENOUS
  Filled 2015-01-12: qty 2

## 2015-01-12 MED ORDER — MIDAZOLAM HCL 2 MG/2ML IJ SOLN
1.0000 mg | INTRAMUSCULAR | Status: DC | PRN
  Administered 2015-01-13 – 2015-01-15 (×12): 2 mg via INTRAVENOUS
  Filled 2015-01-12 (×13): qty 2

## 2015-01-12 MED ORDER — LIDOCAINE HCL (CARDIAC) 20 MG/ML IV SOLN
INTRAVENOUS | Status: AC
  Filled 2015-01-12: qty 5

## 2015-01-12 MED ORDER — FUROSEMIDE 10 MG/ML IJ SOLN
40.0000 mg | Freq: Four times a day (QID) | INTRAMUSCULAR | Status: AC
  Administered 2015-01-12 (×2): 40 mg via INTRAVENOUS
  Filled 2015-01-12: qty 4

## 2015-01-12 MED ORDER — FUROSEMIDE 10 MG/ML IJ SOLN
INTRAMUSCULAR | Status: AC
  Filled 2015-01-12: qty 2

## 2015-01-12 MED ORDER — SUCCINYLCHOLINE CHLORIDE 20 MG/ML IJ SOLN
INTRAMUSCULAR | Status: AC
  Filled 2015-01-12: qty 1

## 2015-01-12 NOTE — Procedures (Signed)
Intubation Procedure Note Alexander Berry [id number] 26-Dec-1965  Procedure: Intubation Indications: Respiratory insufficiency  Procedure Details - called stat to bedside as patient self extubated Consent: Unable to obtain consent because of emergent medical necessity. Time Out: Verified patient identification, verified procedure, site/side was marked, verified correct patient position, special equipment/implants available, medications/allergies/relevent history reviewed, required imaging and test results available.  Performed  Maximum sterile technique was used including cap, gloves, gown, hand hygiene and mask.  MAC   Glide scop used EASY INTUBATION with Etomidate 20mg  and Roc 34m  Evaluation Hemodynamic Status: BP stable throughout; O2 sats: transiently fell during during procedure Patient's Current Condition: stable Complications: No apparent complications Patient did tolerate procedure well. Chest X-ray ordered to verify placement.  CXR: pending.   Alexander Berry 01/12/2015  Dr. Kalman Shan, M.D., F.C.C.P Pulmonary and Critical Care Medicine Staff Physician [facility] Pager: [phone], If no answer or between  15:00h - 7:00h: call [phone]  01/12/2015 6:22 PM

## 2015-01-12 NOTE — Progress Notes (Signed)
eLink Physician-Brief Progress Note Patient Name: Alexander Berry DOB: 1965-12-15 MRN: [id number]   Date of Service  01/12/2015  HPI/Events of Note  Multiple issues: Fever to 104 F, SBP = 80.  eICU Interventions  Will order: 1. Monitor CVP. 2. Norepinephrine IV infusion. Titrate to MAP >= 65. 3. Amend Tylenol order to give for fever PRN. 4. Cooling blanket. 5. Blood Cultures X 2 now.     Intervention Category Intermediate Interventions: Infection - evaluation and management;Hypotension - evaluation and management  Lenell Antu 01/12/2015, 10:55 PM

## 2015-01-12 NOTE — Progress Notes (Signed)
Patient ID: Alexander Berry, male   DOB: [birth year], 49 y.o.   MRN: [id number]    Subjective:  Intubated sedated and cooled Updated daughter and wife Oxygenation the main issue on 100% and 10 peep Updated Family  They need to speak with CCM/Pulmonary Family Indicates he is making purposeful movements and seems to understand them  Objective:  Filed Vitals:   01/12/15 0500 01/12/15 0600 01/12/15 0754 01/12/15 0834  BP: 147/69 151/64 139/69 159/82  Pulse:    114  Temp:   98.8 F (37.1 C)   TempSrc:   Core (Comment)   Resp: 30 17 22 30   Height:      Weight:      SpO2: 97% 94% 97% 97%    Intake/Output from previous day:  Intake/Output Summary (Last 24 hours) at 01/12/15 0906 Last data filed at 01/12/15 0600  Gross per 24 hour  Intake 1941.58 ml  Output   3225 ml  Net -1283.42 ml    Physical Exam: Sedated  Hispanic male  HEENT:  right IJ catheter  NG tube  Neck supple with no adenopathy JVP normal no bruits no thyromegaly Lungs clear with no wheezing and good diaphragmatic motion Heart:  S1/S2 no murmur, no rub, gallop or click PMI normal Abdomen: benighn, BS positve, no tenderness, no AAA no bruit.  No HSM or HJR  RFA catheter removed no hematoma  Distal pulses intact with no bruits Cooled    Lab Results: Basic Metabolic Panel:  Recent Labs  32/67/12 0435 01/12/15 0450  NA 140 140  K 4.7 4.2  CL 110 107  CO2 21* 25  GLUCOSE 125* 140*  BUN 31* 42*  CREATININE 2.17* 2.00*  CALCIUM 8.1* 8.2*   Liver Function Tests:  Recent Labs  01/11/15 0435  AST 465*  ALT 680*  ALKPHOS 53  BILITOT 1.2  PROT 6.1*  ALBUMIN 3.1*   CBC:  Recent Labs  01/11/15 0435 01/12/15 0450  WBC 9.8 5.8  HGB 10.0* 10.0*  HCT 30.9* 30.6*  MCV 85.8 86.0  PLT 187 170   Cardiac Enzymes:  Recent Labs  01/09/15 1200 01/11/15 0435  TROPONINI >65.00* 57.38*    Imaging: Ct Head Wo Contrast  01/10/2015   CLINICAL DATA:  Collapsed during soccer game on Sunday.  EXAM: CT  HEAD WITHOUT CONTRAST  TECHNIQUE: Contiguous axial images were obtained from the base of the skull through the vertex without intravenous contrast.  COMPARISON:  None.  FINDINGS: No acute intracranial abnormality. Specifically, no hemorrhage, hydrocephalus, mass lesion, acute infarction, or significant intracranial injury. No acute calvarial abnormality.  IMPRESSION: No acute intracranial abnormality.   Electronically Signed   By: Charlett Nose M.D.   On: 01/10/2015 18:40   Dg Chest Port 1 View  01/12/2015   CLINICAL DATA:  Respiratory failure  EXAM: PORTABLE CHEST - 1 VIEW  COMPARISON:  01/11/2015  FINDINGS: The endotracheal tube tip is 3 cm above the carina. There is a right jugular central line with tip in the SVC. The nasogastric tube extends below the diaphragm and off the inferior edge of the image. Central airspace opacities persist bilaterally with mild worsening. No pneumothorax.  IMPRESSION: Support equipment appears satisfactorily positioned.  Worsening airspace opacities bilaterally.   Electronically Signed   By: Ellery Plunk M.D.   On: 01/12/2015 06:40   Dg Chest Port 1 View  01/11/2015   CLINICAL DATA:  Respiratory failure  EXAM: PORTABLE CHEST - 1 VIEW  COMPARISON:  01/10/2015  FINDINGS: The endotracheal  tube tip is 4.3 cm above the carina. The right jugular central line extends into the SVC. The nasogastric tube extends into the stomach and off the inferior edge of the image.  There are worsened central airspace opacities bilaterally, now with more confluent consolidation. There is no pneumothorax. There is no large effusion.  IMPRESSION: Support equipment appears satisfactorily positioned.  Worsened central airspace opacities.   Electronically Signed   By: Ellery Plunk M.D.   On: 01/11/2015 06:55    Cardiac Studies:  ECG:  SR rate 50 evolving IMI with persistent lateral ST depression    Telemetry: Sinus bradycardia PVC;s no NSVT    Echo:  6/6  EF 55-60% mild MR reviewed    Medications:   . antiseptic oral rinse  7 mL Mouth Rinse QID  . aspirin  81 mg Per Tube Daily  . cefTAZidime (FORTAZ)  IV  2 g Intravenous Q12H  . chlorhexidine  15 mL Mouth Rinse BID  . famotidine  20 mg Per Tube Daily  . feeding supplement (PRO-STAT SUGAR FREE 64)  30 mL Per Tube BID  . insulin aspart  0-15 Units Subcutaneous 6 times per day  . sodium chloride  3 mL Intravenous Q12H  . THROMBI-PAD  1 each Topical Once  . ticagrelor  90 mg Per Tube BID  . vancomycin  750 mg Intravenous Q12H     . sodium chloride 10 mL/hr at 01/11/15 1900  . feeding supplement (VITAL HIGH PROTEIN) 1,000 mL (01/11/15 1937)  . fentaNYL infusion INTRAVENOUS 150 mcg/hr (01/11/15 2029)    Assessment/Plan:  IMI:  Complicated by cardiac arrest Long area stenting mid RCA Continue ticagrelor.    Echo with normal RV/LV function Troponins very high Amiodarone stopped repeat echo EF 50% no effusion   Cardiac Arrest  Coolong protocol  Neuro status in doubt  resedated due to pulmonary issues   CCM:  Care per for cooling access and vent  F/U CXR ? ARDS higher oxygen requirements  Good urine output -1.8L  Help with ARDS   Alexander Berry 01/12/2015, 9:06 AM

## 2015-01-12 NOTE — Progress Notes (Signed)
PULMONARY / CRITICAL CARE MEDICINE   Name: Hayzen Goelz MRN: [id number] DOB: March 02, 1966    ADMISSION DATE:  01/08/2015 CONSULTATION DATE:  01/08/2015    PT PROFILE:   VF arrest while playing soccer 6/05. Prolonged ACLS. DES to RCA. Hypothermia protocol  MAJOR EVENTS/TEST RESULTS: 6/05 Admitted after OOH arrest 6/05 LHC: Dist RCA lesion, 80% stenosed. Mid RCA lesion, 100% stenosed. There is a 0% residual stenosis post intervention. A drug-eluting stent was placed. 2nd RPLB-1 lesion, 100% stenosed. 2nd RPLB-2 lesion, 100% stenosed. There is a 0% residual stenosis post intervention. RPDA lesion, 80% stenosed. There is a 0% residual stenosis post intervention.  6/06 EEG: Abnormal EEG due to the presence of a generalized slowing indicating a mild to moderate cerebral disturbance (encephalopathy). No epileptiform activity noted 6/06 TTE: The estimated ejection fraction was in the range of 55% to 60%. Mild posterior wall hypokinesis - decreased Definity microsphere uptake in the posterior wall suggests hypoperfusion  6/07 Rewarmed. Continuous sedatives stopped. RASS -5. No spont movement 6/07 CT head: NAD 6/07 repeat EEG: Abnormal EEG due to the presence of a generalized slowing indicating a mild to moderate cerebral disturbance (encephalopathy). No epileptiform activity noted 6/08 Increased agitation requiring sedation. Not F/C. Increased O2 reqts. Frothy pink sputum. Resp culture obatined. PCT elevated. Empiric abx initiated. CXR c/w increased pulm edema. Hypertensive. Furosemide ordered 6/08 repeat limited TTE: LVEF 50%. Inferior wall AK 6/09 Intermittently F/C. RASS -2 to +1. Requiring 70% O2. Dexmedetomidine initiated  INDWELLING DEVICES: Femoral sheath 6/05 >> 6/07 ETT 6/05 >>  R IJ CVL 6/05 >>    MICRO DATA: Resp 6/08 >>   PCT 6/08: 7.46           6/09: 8.90          6/10:    ANTIMICROBIALS:  Vanc 6/08 >>  Ceftazidime 6/08 >>   SUBJ: RASS -1, intermttently F/C. Remains  on fent gtt.    VITAL SIGNS: Temp:  [98.1 F (36.7 C)-99.5 F (37.5 C)] 98.4 F (36.9 C) (06/09 1200) Pulse Rate:  [105-114] 107 (06/09 1240) Resp:  [10-30] 16 (06/09 1300) BP: (137-171)/(61-89) 143/81 mmHg (06/09 1300) SpO2:  [89 %-99 %] 98 % (06/09 1300) FiO2 (%):  [50 %-100 %] 50 % (06/09 1240) Weight:  [94.5 kg (208 lb 5.4 oz)] 94.5 kg (208 lb 5.4 oz) (06/09 0400) HEMODYNAMICS: CVP:  [7 mmHg-13 mmHg] 13 mmHg VENTILATOR SETTINGS: Vent Mode:  [-] PRVC FiO2 (%):  [50 %-100 %] 50 % Set Rate:  [14 bmp] 14 bmp Vt Set:  [600 mL] 600 mL PEEP:  [5 cmH20-10 cmH20] 10 cmH20 Plateau Pressure:  [15 cmH20-28 cmH20] 18 cmH20 INTAKE / OUTPUT:  Intake/Output Summary (Last 24 hours) at 01/12/15 1434 Last data filed at 01/12/15 1351  Gross per 24 hour  Intake 1746.58 ml  Output   3625 ml  Net -1878.42 ml    PHYSICAL EXAMINATION: General: RASS -1, + F/C Neuro: MAEs, no focal deficits HEENT: NCAT, WNL Cardiovascular:  Reg, no M Lungs: coarse throughout, blood tinged purulent resp secretions Abdomen: soft, diminished BS Ext: warm, no LE edema  LABS:  PULMONARY  Recent Labs Lab 01/08/15 2106 01/09/15 0340 01/09/15 0741 01/09/15 1229 01/09/15 1611  PHART  --  7.215*  --   --   --   PCO2ART  --  36.9  --   --   --   PO2ART  --  292*  --   --   --   HCO3  --  14.4*  --   --   --   TCO2 12 15.5 15 15 16   O2SAT  --  99.2  --   --   --     CBC  Recent Labs Lab 01/10/15 0420 01/11/15 0435 01/12/15 0450  HGB 11.4* 10.0* 10.0*  HCT 34.4* 30.9* 30.6*  WBC 9.7 9.8 5.8  PLT 149* 187 170    COAGULATION  Recent Labs Lab 01/08/15 1515 01/08/15 1845 01/08/15 2300  INR 1.54* >10.00* 1.70*    CARDIAC    Recent Labs Lab 01/08/15 2300 01/09/15 0050 01/09/15 0751 01/09/15 1200 01/11/15 0435  TROPONINI 58.08* 62.31* >65.00* >65.00* 57.38*   No results for input(s): PROBNP in the last 168 hours.   CHEMISTRY  Recent Labs Lab 01/09/15 0445  01/10/15 0005  01/10/15 0420 01/10/15 0735 01/11/15 0435 01/12/15 0450  NA 140  < > 137 141 139 140 140  K 4.0  < > 4.6 4.0 4.4 4.7 4.2  CL 113*  < > 112* 112* 113* 110 107  CO2 18*  < > 18* 20* 20* 21* 25  GLUCOSE 154*  < > 124* 109* 103* 125* 140*  BUN 19  < > 21* 23* 22* 31* 42*  CREATININE 2.11*  < > 1.96* 1.95* 1.91* 2.17* 2.00*  CALCIUM 7.8*  < > 8.1* 8.2* 7.9* 8.1* 8.2*  MG 2.2  --   --   --   --   --   --   PHOS 2.3*  --   --   --   --   --   --   < > = values in this interval not displayed. Estimated Creatinine Clearance: 50.1 mL/min (by C-G formula based on Cr of 2).   LIVER  Recent Labs Lab 01/08/15 1515 01/08/15 1845 01/08/15 2300 01/09/15 0445 01/11/15 0435  AST 848*  --   --  1757* 465*  ALT 1428*  --   --  1352* 680*  ALKPHOS 70  --   --  53 53  BILITOT 0.6  --   --  0.8 1.2  PROT 6.2*  --   --  6.1* 6.1*  ALBUMIN 3.8  --   --  3.4* 3.1*  INR 1.54* >10.00* 1.70*  --   --      INFECTIOUS  Recent Labs Lab 01/08/15 1920 01/11/15 0435 01/12/15 0450  LATICACIDVEN 9.3*  --   --   PROCALCITON  --  7.46 8.90     ENDOCRINE CBG (last 3)   Recent Labs  01/12/15 0448 01/12/15 0753 01/12/15 1154  GLUCAP 131* 100* 123*     CXR: increased B AS dz    ASSESSMENT / PLAN: CARDIOVASCULAR A:  STEMI Out of hospital VF arrest Cardiogenic shock, resolved Hypertension P:  Cards following PRN hydralazine to maitnain SBP < 160 mmHg PRN metoprolol to maintain HR < 100/min  PULMONARY A:  VDRF post arrest Pulm edema Suspect PNA  P:   Cont vent support - settings reviewed and/or adjusted Cont vent bundle Daily SBT if/when meets criteria repeat furosemide X 2 doses 6/09  RENAL A:  AKI, nonoliguric - Cr improving Hypokalemia, resolved P:   Monitor BMET intermittently Monitor I/Os Correct electrolytes as indicated  GASTROINTESTINAL A:   Shock liver - improving LFTs P:   SUP: enteral famotidine Cont TFs Monitor LFTs  intermittently  HEMATOLOGIC A:   Mild anemia without overt blood loss P:  DVT px: Brilinta Monitor CBC intermittently Transfuse per usual guidelines  INFECTIOUS A:   Elevated PCT Suspect PNA, NOS P:   Monitor temp, WBC count Micro and abx as above  ENDOCRINE A:   Hyperglycemia without prior dx of DM P:   Cont SSI  NEUROLOGIC A:   Anoxic encephalopathy - severity difficult to assess P:   RASS goal: -1 Begin precedex 6/09  Wean fentanyl gtt as able for RASS -1 Daily WUA   FAMILY  Family updated in detail 6/09  CCM X 35 mins   Billy Fischer, MD ; [facility] service Mobile [phone].  After 5:30 PM or weekends, call [phone]

## 2015-01-13 ENCOUNTER — Inpatient Hospital Stay (HOSPITAL_COMMUNITY): Payer: Medicaid Other

## 2015-01-13 DIAGNOSIS — R6521 Severe sepsis with septic shock: Secondary | ICD-10-CM

## 2015-01-13 DIAGNOSIS — D696 Thrombocytopenia, unspecified: Secondary | ICD-10-CM | POA: Diagnosis present

## 2015-01-13 DIAGNOSIS — A419 Sepsis, unspecified organism: Secondary | ICD-10-CM | POA: Diagnosis present

## 2015-01-13 DIAGNOSIS — J80 Acute respiratory distress syndrome: Secondary | ICD-10-CM

## 2015-01-13 DIAGNOSIS — J1520 Pneumonia due to staphylococcus, unspecified: Secondary | ICD-10-CM

## 2015-01-13 LAB — CBC
HEMATOCRIT: 23.7 % — AB (ref 39.0–52.0)
Hemoglobin: 7.9 g/dL — ABNORMAL LOW (ref 13.0–17.0)
MCH: 28.3 pg (ref 26.0–34.0)
MCHC: 33.3 g/dL (ref 30.0–36.0)
MCV: 84.9 fL (ref 78.0–100.0)
Platelets: 149 10*3/uL — ABNORMAL LOW (ref 150–400)
RBC: 2.79 MIL/uL — ABNORMAL LOW (ref 4.22–5.81)
RDW: 15.4 % (ref 11.5–15.5)
WBC: 5 10*3/uL (ref 4.0–10.5)

## 2015-01-13 LAB — COMPREHENSIVE METABOLIC PANEL
ALBUMIN: 2.3 g/dL — AB (ref 3.5–5.0)
ALT: 255 U/L — ABNORMAL HIGH (ref 17–63)
AST: 137 U/L — ABNORMAL HIGH (ref 15–41)
Alkaline Phosphatase: 47 U/L (ref 38–126)
Anion gap: 10 (ref 5–15)
BUN: 64 mg/dL — ABNORMAL HIGH (ref 6–20)
CHLORIDE: 109 mmol/L (ref 101–111)
CO2: 27 mmol/L (ref 22–32)
CREATININE: 2.98 mg/dL — AB (ref 0.61–1.24)
Calcium: 8.1 mg/dL — ABNORMAL LOW (ref 8.9–10.3)
GFR calc Af Amer: 27 mL/min — ABNORMAL LOW (ref >60)
GFR, EST NON AFRICAN AMERICAN: 23 mL/min — AB (ref >60)
GLUCOSE: 121 mg/dL — AB (ref 65–99)
Potassium: 3.9 mmol/L (ref 3.5–5.1)
Sodium: 146 mmol/L — ABNORMAL HIGH (ref 135–145)
Total Bilirubin: 2.4 mg/dL — ABNORMAL HIGH (ref 0.3–1.2)
Total Protein: 5.3 g/dL — ABNORMAL LOW (ref 6.5–8.1)

## 2015-01-13 LAB — GLUCOSE, CAPILLARY
GLUCOSE-CAPILLARY: 107 mg/dL — AB (ref 65–99)
GLUCOSE-CAPILLARY: 117 mg/dL — AB (ref 65–99)
Glucose-Capillary: 84 mg/dL (ref 65–99)
Glucose-Capillary: 118 mg/dL — ABNORMAL HIGH (ref 65–99)
Glucose-Capillary: 124 mg/dL — ABNORMAL HIGH (ref 65–99)

## 2015-01-13 LAB — HEPATIC FUNCTION PANEL
ALBUMIN: 2.1 g/dL — AB (ref 3.5–5.0)
ALT: 223 U/L — AB (ref 17–63)
AST: 126 U/L — AB (ref 15–41)
Alkaline Phosphatase: 47 U/L (ref 38–126)
Bilirubin, Direct: 0.7 mg/dL — ABNORMAL HIGH (ref 0.1–0.5)
Indirect Bilirubin: 1.7 mg/dL — ABNORMAL HIGH (ref 0.3–0.9)
Total Bilirubin: 2.4 mg/dL — ABNORMAL HIGH (ref 0.3–1.2)
Total Protein: 5.1 g/dL — ABNORMAL LOW (ref 6.5–8.1)

## 2015-01-13 LAB — PROCALCITONIN: PROCALCITONIN: 9.8 ng/mL

## 2015-01-13 MED ORDER — PRO-STAT SUGAR FREE PO LIQD
30.0000 mL | Freq: Three times a day (TID) | ORAL | Status: DC
  Administered 2015-01-13 – 2015-01-19 (×17): 30 mL
  Filled 2015-01-13 (×23): qty 30

## 2015-01-13 MED ORDER — ACETAMINOPHEN 325 MG PO TABS
650.0000 mg | ORAL_TABLET | ORAL | Status: DC | PRN
  Administered 2015-01-13 – 2015-01-19 (×6): 650 mg
  Filled 2015-01-13 (×5): qty 2

## 2015-01-13 MED ORDER — CIPROFLOXACIN IN D5W 400 MG/200ML IV SOLN
400.0000 mg | Freq: Two times a day (BID) | INTRAVENOUS | Status: DC
  Administered 2015-01-13 – 2015-01-15 (×5): 400 mg via INTRAVENOUS
  Filled 2015-01-13 (×6): qty 200

## 2015-01-13 MED ORDER — FENTANYL BOLUS VIA INFUSION
25.0000 ug | INTRAVENOUS | Status: DC | PRN
  Administered 2015-01-13 – 2015-01-14 (×3): 100 ug via INTRAVENOUS
  Administered 2015-01-14: 75 ug via INTRAVENOUS
  Administered 2015-01-14 (×2): 50 ug via INTRAVENOUS
  Administered 2015-01-14 – 2015-01-15 (×6): 100 ug via INTRAVENOUS
  Administered 2015-01-15: 50 ug via INTRAVENOUS
  Administered 2015-01-15 – 2015-01-16 (×5): 100 ug via INTRAVENOUS
  Filled 2015-01-13: qty 100

## 2015-01-13 MED ORDER — DEXMEDETOMIDINE HCL IN NACL 400 MCG/100ML IV SOLN
0.4000 ug/kg/h | INTRAVENOUS | Status: DC
  Administered 2015-01-13 – 2015-01-14 (×7): 1.2 ug/kg/h via INTRAVENOUS
  Administered 2015-01-15: 1 ug/kg/h via INTRAVENOUS
  Administered 2015-01-15 (×6): 1.2 ug/kg/h via INTRAVENOUS
  Filled 2015-01-13 (×15): qty 100

## 2015-01-13 MED ORDER — LINEZOLID 600 MG/300ML IV SOLN
600.0000 mg | Freq: Two times a day (BID) | INTRAVENOUS | Status: DC
  Administered 2015-01-13 – 2015-01-14 (×3): 600 mg via INTRAVENOUS
  Filled 2015-01-13 (×4): qty 300

## 2015-01-13 NOTE — Progress Notes (Signed)
CRITICAL VALUE ALERT  Critical value received:  Hemoglobin 7.9  Date of notification:  01/13/2015   Time of notification:  0445  Critical value read back:Yes.    Nurse who received alert:  Dorna Leitz RN   MD notified (1st page):  Molli Knock MD   Time of first page:  0450  MD notified (2nd page):  Time of second page:  Responding MD:  Molli Knock MD  Time MD responded:  [id number]

## 2015-01-13 NOTE — Progress Notes (Signed)
Nutrition Follow-up  DOCUMENTATION CODES:  Obesity unspecified  INTERVENTION:  Continue Vital High Protein @ 50 ml/hr  Increase Prostat to 30 ml TID  Tube feeding regimen provides 1500 kcal (fevers) (15 kcal/kg of actual weight), 150 grams of protein, and 1003 ml of H2O.   NUTRITION DIAGNOSIS:  Inadequate oral intake related to inability to eat as evidenced by NPO status.  ongoing  GOAL:  Provide needs based on ASPEN/SCCM guidelines  Met.   MONITOR:  TF tolerance, Vent status, Labs, I & O's, Weight trends  REASON FOR ASSESSMENT:  Consult Enteral/tube feeding initiation and management  ASSESSMENT: Pt admitted with VF arrest while playing soccer 6/5. S/P cath with drug-eluting stent. Pt rewarmed 6/7 at 10:30.   Patient is currently intubated on ventilator support MV: 11.6 L/min Temp (24hrs), Avg:103.6 F (39.8 C), Min:98.4 F (36.9 C), Max:104.5 F (40.3 C)  Pt self extubated 6/9 and was immediately re-intubated.  NG tube in distal stomach per xray 6/5.  Labs reviewed: sodium, BUN, Cr, AST, ALT elevated. WBC WNL + fevers, per RN pt placed back on hypothermia pads because cooling blanket was not helping.  Pt on fentanyl and precedex.   Height:  Ht Readings from Last 1 Encounters:  No data found for Ht  5'7" (170.2 cm) 6/5  Weight:  Wt Readings from Last 1 Encounters:  01/12/15 208 lb 5.4 oz (94.5 kg)    Ideal Body Weight:  67.2 kg  Wt Readings from Last 10 Encounters:  01/12/15 208 lb 5.4 oz (94.5 kg)    BMI:  Body mass index is 32.62 kg/(m^2).  Estimated Nutritional Needs:  Kcal:  1100-1400  Protein:  134  Fluid:  > 1.5 L/day  Skin:  Reviewed, no issues  Diet Order:    NPO  EDUCATION NEEDS:  No education needs identified at this time   Intake/Output Summary (Last 24 hours) at 01/13/15 0928 Last data filed at 01/13/15 0807  Gross per 24 hour  Intake 2500.37 ml  Output   3350 ml  Net -849.63 ml    Last BM:  6/8  [place], [place], [place] Pager [phone] After Hours Pager

## 2015-01-13 NOTE — Progress Notes (Signed)
Pt sat at 100. Rt weaning to 90%.

## 2015-01-13 NOTE — Progress Notes (Signed)
Patient ID: Alexander Berry, male   DOB: [birth year], 49 y.o.   MRN: [id number]    Subjective:  Intubated sedated cooling blanket Extubated self and had to be reintubated Sats still an issue. Spiked fever Hemodynamics stable   Objective:  Filed Vitals:   01/13/15 0715 01/13/15 0800 01/13/15 0835 01/13/15 0927  BP: 120/85 106/67 111/66   Pulse:   89   Temp: 103.5 F (39.7 C) 103.5 F (39.7 C)  99.5 F (37.5 C)  TempSrc: Core (Comment)     Resp: Height:      Weight:      SpO2: 100% 100% 100%     Intake/Output from previous day:  Intake/Output Summary (Last 24 hours) at 01/13/15 1024 Last data filed at 01/13/15 9147  Gross per 24 hour  Intake 2440.37 ml  Output   3350 ml  Net -909.63 ml    Physical Exam: Sedated  Hispanic male  HEENT:  right IJ catheter  NG tube  Neck supple with no adenopathy JVP normal no bruits no thyromegaly Lungs clear with no wheezing and good diaphragmatic motion Heart:  S1/S2 no murmur, no rub, gallop or click PMI normal Abdomen: benighn, BS positve, no tenderness, no AAA no bruit.  No HSM or HJR  RFA catheter removed no hematoma  Distal pulses intact with no bruits Cooled    Lab Results: Basic Metabolic Panel:  Recent Labs  82/95/62 0450 01/13/15 0415  NA 140 146*  K 4.2 3.9  CL 107 109  CO2 25 27  GLUCOSE 140* 121*  BUN 42* 64*  CREATININE 2.00* 2.98*  CALCIUM 8.2* 8.1*   Liver Function Tests:  Recent Labs  01/11/15 0435 01/13/15 0415  AST 465* 137*  ALT 680* 255*  ALKPHOS 53 47  BILITOT 1.2 2.4*  PROT 6.1* 5.3*  ALBUMIN 3.1* 2.3*   CBC:  Recent Labs  01/12/15 0450 01/13/15 0415  WBC 5.8 5.0  HGB 10.0* 7.9*  HCT 30.6* 23.7*  MCV 86.0 84.9  PLT 170 149*   Cardiac Enzymes:  Recent Labs  01/11/15 0435  TROPONINI 57.38*    Imaging: Dg Chest Port 1 View  01/13/2015   CLINICAL DATA:  Respiratory failure  EXAM: PORTABLE CHEST - 1 VIEW  COMPARISON:  01/12/2015  FINDINGS: The endotracheal tube  is 4.2 cm above the carina. The nasogastric tube extends into the stomach. The right jugular central line extends into the SVC. There is no pneumothorax. There are multifocal airspace opacities with confluent consolidation in the right upper lobe and left lower lobe. No large effusion is evident.  IMPRESSION: Support equipment appears satisfactorily positioned.  No significant interval change in the bilateral airspace opacities.   Electronically Signed   By: Ellery Plunk M.D.   On: 01/13/2015 05:35   Dg Chest Port 1 View  01/12/2015   CLINICAL DATA:  Respiratory failure and accidental self extubation with re-intubation performed.  EXAM: PORTABLE CHEST - 1 VIEW  COMPARISON:  0430 hours  FINDINGS: The new endotracheal tube is appropriately positioned with the tip approximately 2 cm above the carina. Right jugular central line tip is stable in position within the upper SVC. Lungs show persistent dense airspace edema. Compared to the film earlier this morning, there is very slight improved aeration in the right central and lower lung. No pneumothorax or significant pleural fluid present. The heart size and mediastinal contours are stable.  IMPRESSION: New endotracheal tube tip lies 2 cm above the carina. Since the  prior study, there is some improved aeration in the right central and lower lung.   Electronically Signed   By: Irish Lack M.D.   On: 01/12/2015 18:53   Dg Chest Port 1 View  01/12/2015   CLINICAL DATA:  Respiratory failure  EXAM: PORTABLE CHEST - 1 VIEW  COMPARISON:  01/11/2015  FINDINGS: The endotracheal tube tip is 3 cm above the carina. There is a right jugular central line with tip in the SVC. The nasogastric tube extends below the diaphragm and off the inferior edge of the image. Central airspace opacities persist bilaterally with mild worsening. No pneumothorax.  IMPRESSION: Support equipment appears satisfactorily positioned.  Worsening airspace opacities bilaterally.   Electronically  Signed   By: Ellery Plunk M.D.   On: 01/12/2015 06:40    Cardiac Studies:  ECG:  SR rate 50 evolving IMI with persistent lateral ST depression    Telemetry: SR less tachycardic  01/13/2015   Echo:  6/6  EF 55-60% mild MR reviewed   Medications:   . antiseptic oral rinse  7 mL Mouth Rinse QID  . aspirin  81 mg Per Tube Daily  . cefTAZidime (FORTAZ)  IV  2 g Intravenous Q12H  . chlorhexidine  15 mL Mouth Rinse BID  . ciprofloxacin  400 mg Intravenous Q12H  . famotidine  20 mg Per Tube Daily  . feeding supplement (PRO-STAT SUGAR FREE 64)  30 mL Per Tube BID  . insulin aspart  0-15 Units Subcutaneous 6 times per day  . linezolid  600 mg Intravenous Q12H  . sodium chloride  3 mL Intravenous Q12H  . ticagrelor  90 mg Per Tube BID     . sodium chloride 10 mL/hr at 01/12/15 1900  . dexmedetomidine 1.2 mcg/kg/hr (01/13/15 0927)  . feeding supplement (VITAL HIGH PROTEIN) 1,000 mL (01/12/15 1900)  . fentaNYL infusion INTRAVENOUS 100 mcg/hr (01/13/15 0205)  . norepinephrine (LEVOPHED) Adult infusion 4 mcg/min (01/13/15 0015)    Assessment/Plan:  IMI:  Complicated by cardiac arrest Long area stenting mid RCA Continue ticagrelor.    Echo with normal RV/LV function Troponins very high Amiodarone stopped repeat echo EF 50% no effusion   Cardiac Arrest  Coolong protocol  Neuro status in doubt  resedated due to pulmonary issues   CCM:  Care per for cooling access and vent  F/U CXR ? ARDS higher oxygen requirements  I/O negative consider transfusion given Hct Drop   [name] 01/13/2015, 10:24 AM

## 2015-01-13 NOTE — Progress Notes (Signed)
ANTIBIOTIC CONSULT NOTE - FOLLOW UP  Pharmacy Consult for Cipro Indication: HCAP  Not on File  Patient Measurements: Height: 5\' 7"  (170.2 cm) Weight: 208 lb 5.4 oz (94.5 kg) IBW/kg (Calculated) : 66.1  Vital Signs: Temp: 101.8 F (38.8 C) (06/10 1100) Temp Source: Core (Comment) (06/10 1041) BP: 105/59 mmHg (06/10 1217) Pulse Rate: 76 (06/10 1217) Intake/Output from previous day: 06/09 0701 - 06/10 0700 In: 2650.4 [I.V.:800.4; NG/GT:1450; IV Piggyback:400] Out: 3300 [Urine:3300] Intake/Output from this shift: Total I/O In: -  Out: 600 [Urine:600]  Labs:  Recent Labs  01/11/15 0435 01/12/15 0450 01/13/15 0415  WBC 9.8 5.8 5.0  HGB 10.0* 10.0* 7.9*  PLT 187 170 149*  CREATININE 2.17* 2.00* 2.98*   Estimated Creatinine Clearance: 33.6 mL/min (by C-G formula based on Cr of 2.98).  Assessment: 48yom who was started on antibiotics 6/8 for HCAP now with persistent fever, hypotension requiring norepinephrine, and staph aureus growing in his respiratory culture. Vancomycin changed to linezolid. Cipro to be added to double cover for gram negatives. His renal function is worsening.  6/8 Vancomycin>>6/10 6/10 Linezolid>> 6/8 Ceftazidime>> 6/10 Cipro>> 6/8 resp cx>> abundant staph aureus 6/10 blood x2>> 6/10 resp cx>>  Goal of Therapy:  Appropriate dosing  Plan:  1) Cipro 400mg  IV q12 2) Follow cultures, renal function  Fredrik Rigger 01/13/2015,12:47 PM

## 2015-01-13 NOTE — Progress Notes (Signed)
**Note De-identified  Obfuscation** Sputum collected and sent to lab 

## 2015-01-13 NOTE — Progress Notes (Addendum)
PULMONARY / CRITICAL CARE MEDICINE   Name: Alexander Berry MRN: [id number] DOB: 13-Mar-1966    ADMISSION DATE:  01/08/2015 CONSULTATION DATE:  01/08/2015    PT PROFILE:   VF arrest while playing soccer 6/05. Prolonged ACLS. DES to RCA. Hypothermia protocol  MAJOR EVENTS/TEST RESULTS: 6/05 Admitted after OOH arrest 6/05 LHC: Dist RCA lesion, 80% stenosed. Mid RCA lesion, 100% stenosed. There is a 0% residual stenosis post intervention. A drug-eluting stent was placed. 2nd RPLB-1 lesion, 100% stenosed. 2nd RPLB-2 lesion, 100% stenosed. There is a 0% residual stenosis post intervention. RPDA lesion, 80% stenosed. There is a 0% residual stenosis post intervention.  6/06 EEG: Abnormal EEG due to the presence of a generalized slowing indicating a mild to moderate cerebral disturbance (encephalopathy). No epileptiform activity noted 6/06 TTE: The estimated ejection fraction was in the range of 55% to 60%. Mild posterior wall hypokinesis - decreased Definity microsphere uptake in the posterior wall suggests hypoperfusion  6/07 Rewarmed. Continuous sedatives stopped. RASS -5. No spont movement 6/07 CT head: NAD 6/07 repeat EEG: Abnormal EEG due to the presence of a generalized slowing indicating a mild to moderate cerebral disturbance (encephalopathy). No epileptiform activity noted 6/08 Increased agitation requiring sedation. Not F/C. Increased O2 reqts. Frothy pink sputum. Resp culture obatined. PCT elevated. Empiric abx initiated. CXR c/w increased pulm edema. Hypertensive. Furosemide ordered 6/08 repeat limited TTE: LVEF 50%. Inferior wall AK 6/09 Intermittently F/C. RASS -2 to +1. Requiring 70% O2. Dexmedetomidine initiated 6/09 PM: self extubated, required immediate re-intubation. High fevers - cooling blanket initiated. Hypotension - NE initiated 6/10 High fevers persist. Arctic sun device re-ordered to maintain T < 99.5. PCT remained elevated. Abx adjusted. Cr rising. New dx of severe  sepsis/septic shock rendered. Elevated bilirubin - RUQ [place] ordered  INDWELLING DEVICES: Femoral sheath 6/05 >> 6/07 ETT 6/05 >>  R IJ CVL 6/05 >>    MICRO DATA: Resp 6/08 >> abundant staph aureus >>  resp 6/10 >>  Blood 6/10 >>   PCT 6/08: 7.46           6/09: 8.90          6/10: 9.80   ANTIMICROBIALS:  Vanc 6/08 >> 6/20 Ceftazidime 6/08 >>  Linezolid 6/10 >>  Ciprofloxacin 6/10 >>   SUBJ: RASS -2, not F/C. Remains on dex/fent gtt.    VITAL SIGNS: Temp:  [98.9 F (37.2 C)-104.5 F (40.3 C)] 99 F (37.2 C) (06/10 1200) Pulse Rate:  [76-121] 76 (06/10 1217) Resp:  [6-42] 20 (06/10 1217) BP: (82-173)/(38-85) 105/59 mmHg (06/10 1217) SpO2:  [88 %-100 %] 100 % (06/10 1217) FiO2 (%):  [60 %-100 %] 70 % (06/10 1217) HEMODYNAMICS: CVP:  [6 mmHg-8 mmHg] 8 mmHg VENTILATOR SETTINGS: Vent Mode:  [-] PRVC FiO2 (%):  [60 %-100 %] 70 % Set Rate:  [14 bmp] 14 bmp Vt Set:  [600 mL] 600 mL PEEP:  [10 cmH20] 10 cmH20 Plateau Pressure:  [20 cmH20-32 cmH20] 21 cmH20 INTAKE / OUTPUT:  Intake/Output Summary (Last 24 hours) at 01/13/15 1310 Last data filed at 01/13/15 1212  Gross per 24 hour  Intake 2405.87 ml  Output   2725 ml  Net -319.13 ml    PHYSICAL EXAMINATION: General: RASS -1, + F/C Neuro: MAEs, no focal deficits HEENT: NCAT, WNL Cardiovascular:  Reg, no M Lungs: coarse throughout, blood tinged purulent resp secretions Abdomen: soft, diminished BS Ext: warm, no LE edema  LABS:  PULMONARY  Recent Labs Lab 01/08/15 2106 01/09/15 0340 01/09/15 0741 01/09/15  1229 01/09/15 1611  PHART  --  7.215*  --   --   --   PCO2ART  --  36.9  --   --   --   PO2ART  --  292*  --   --   --   HCO3  --  14.4*  --   --   --   TCO2 12 15.5 15 15 16   O2SAT  --  99.2  --   --   --     CBC  Recent Labs Lab 01/11/15 0435 01/12/15 0450 01/13/15 0415  HGB 10.0* 10.0* 7.9*  HCT 30.9* 30.6* 23.7*  WBC 9.8 5.8 5.0  PLT 187 170 149*    COAGULATION  Recent Labs Lab  01/08/15 1515 01/08/15 1845 01/08/15 2300  INR 1.54* >10.00* 1.70*    CARDIAC    Recent Labs Lab 01/08/15 2300 01/09/15 0050 01/09/15 0751 01/09/15 1200 01/11/15 0435  TROPONINI 58.08* 62.31* >65.00* >65.00* 57.38*   No results for input(s): PROBNP in the last 168 hours.   CHEMISTRY  Recent Labs Lab 01/09/15 0445  01/10/15 0420 01/10/15 0735 01/11/15 0435 01/12/15 0450 01/13/15 0415  NA 140  < > 141 139 140 140 146*  K 4.0  < > 4.0 4.4 4.7 4.2 3.9  CL 113*  < > 112* 113* 110 107 109  CO2 18*  < > 20* 20* 21* 25 27  GLUCOSE 154*  < > 109* 103* 125* 140* 121*  BUN 19  < > 23* 22* 31* 42* 64*  CREATININE 2.11*  < > 1.95* 1.91* 2.17* 2.00* 2.98*  CALCIUM 7.8*  < > 8.2* 7.9* 8.1* 8.2* 8.1*  MG 2.2  --   --   --   --   --   --   PHOS 2.3*  --   --   --   --   --   --   < > = values in this interval not displayed. Estimated Creatinine Clearance: 33.6 mL/min (by C-G formula based on Cr of 2.98).   LIVER  Recent Labs Lab 01/08/15 1515 01/08/15 1845 01/08/15 2300 01/09/15 0445 01/11/15 0435 01/13/15 0415 01/13/15 1100  AST 848*  --   --  1757* 465* 137* 126*  ALT 1428*  --   --  1352* 680* 255* 223*  ALKPHOS 70  --   --  53 53 47 47  BILITOT 0.6  --   --  0.8 1.2 2.4* 2.4*  PROT 6.2*  --   --  6.1* 6.1* 5.3* 5.1*  ALBUMIN 3.8  --   --  3.4* 3.1* 2.3* 2.1*  INR 1.54* >10.00* 1.70*  --   --   --   --      INFECTIOUS  Recent Labs Lab 01/08/15 1920 01/11/15 0435 01/12/15 0450 01/13/15 0415  LATICACIDVEN 9.3*  --   --   --   PROCALCITON  --  7.46 8.90 9.80     ENDOCRINE CBG (last 3)   Recent Labs  01/13/15 0435 01/13/15 0755 01/13/15 1141  GLUCAP 107* 117* 84     CXR: ARDS pattern   ASSESSMENT / PLAN: CARDIOVASCULAR A:  Acute STEMI 6/05 Out of hospital VF arrest 6/05 Cardiogenic shock, resolved Hypertension Septic shock 6/09 P:  Cards following Cont NE to maintain MAP > 65 mmHg  PULMONARY A:  VDRF post arrest 6/05 Pulm  edema 6/06 Staph PNA 6/08 ARDS 6/09 P:   Cont vent support - settings reviewed and/or adjusted Cont  vent bundle Daily SBT if/when meets criteria Daily CXR for now  RENAL A:  AKI, nonoliguric 6/05 ? CKD (Cr 2.15 on admission) Hypokalemia, resolved P:   Monitor BMET intermittently Monitor I/Os Correct electrolytes as indicated  GASTROINTESTINAL A:   Shock liver - improving transaminases Hyperbilirubinemia P:   SUP: enteral famotidine Cont TFs Monitor LFTs intermittently RUQ [place] 6/10  HEMATOLOGIC A:   Anemia without overt blood loss Mild thrombocytopenia P:  DVT px: Brilinta, SCDs Monitor CBC intermittently Transfuse per usual guidelines  INFECTIOUS A:   Persistently elevated PCT Staph PNA, NOS R/O cholecystitis P:   Monitor temp, WBC count Micro and abx as above  ENDOCRINE A:   Hyperglycemia without prior dx of DM P:   Cont SSI  NEUROLOGIC A:   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  After 5:30 PM or weekends, call [phone]

## 2015-01-14 ENCOUNTER — Inpatient Hospital Stay (HOSPITAL_COMMUNITY): Payer: Medicaid Other

## 2015-01-14 DIAGNOSIS — D696 Thrombocytopenia, unspecified: Secondary | ICD-10-CM

## 2015-01-14 LAB — HEPATIC FUNCTION PANEL
ALBUMIN: 2.2 g/dL — AB (ref 3.5–5.0)
ALK PHOS: 57 U/L (ref 38–126)
ALT: 216 U/L — ABNORMAL HIGH (ref 17–63)
AST: 121 U/L — ABNORMAL HIGH (ref 15–41)
BILIRUBIN INDIRECT: 2 mg/dL — AB (ref 0.3–0.9)
BILIRUBIN TOTAL: 2.9 mg/dL — AB (ref 0.3–1.2)
Bilirubin, Direct: 0.9 mg/dL — ABNORMAL HIGH (ref 0.1–0.5)
Total Protein: 5.5 g/dL — ABNORMAL LOW (ref 6.5–8.1)

## 2015-01-14 LAB — BASIC METABOLIC PANEL
ANION GAP: 10 (ref 5–15)
BUN: 78 mg/dL — ABNORMAL HIGH (ref 6–20)
CHLORIDE: 111 mmol/L (ref 101–111)
CO2: 26 mmol/L (ref 22–32)
Calcium: 8 mg/dL — ABNORMAL LOW (ref 8.9–10.3)
Creatinine, Ser: 2.43 mg/dL — ABNORMAL HIGH (ref 0.61–1.24)
GFR calc Af Amer: 35 mL/min — ABNORMAL LOW (ref >60)
GFR calc non Af Amer: 30 mL/min — ABNORMAL LOW (ref >60)
GLUCOSE: 136 mg/dL — AB (ref 65–99)
Potassium: 3.7 mmol/L (ref 3.5–5.1)
SODIUM: 147 mmol/L — AB (ref 135–145)

## 2015-01-14 LAB — GLUCOSE, CAPILLARY
GLUCOSE-CAPILLARY: 122 mg/dL — AB (ref 65–99)
GLUCOSE-CAPILLARY: 100 mg/dL — AB (ref 65–99)
GLUCOSE-CAPILLARY: 109 mg/dL — AB (ref 65–99)
Glucose-Capillary: 101 mg/dL — ABNORMAL HIGH (ref 65–99)
Glucose-Capillary: 109 mg/dL — ABNORMAL HIGH (ref 65–99)
Glucose-Capillary: 146 mg/dL — ABNORMAL HIGH (ref 65–99)

## 2015-01-14 LAB — CBC
HCT: 24.2 % — ABNORMAL LOW (ref 39.0–52.0)
Hemoglobin: 8 g/dL — ABNORMAL LOW (ref 13.0–17.0)
MCH: 27.9 pg (ref 26.0–34.0)
MCHC: 33.1 g/dL (ref 30.0–36.0)
MCV: 84.3 fL (ref 78.0–100.0)
PLATELETS: 125 10*3/uL — AB (ref 150–400)
RBC: 2.87 MIL/uL — ABNORMAL LOW (ref 4.22–5.81)
RDW: 15.9 % — ABNORMAL HIGH (ref 11.5–15.5)
WBC: 8 10*3/uL (ref 4.0–10.5)

## 2015-01-14 LAB — CULTURE, RESPIRATORY

## 2015-01-14 LAB — CULTURE, RESPIRATORY W GRAM STAIN

## 2015-01-14 MED ORDER — FREE WATER
200.0000 mL | Freq: Three times a day (TID) | Status: DC
  Administered 2015-01-14 – 2015-01-16 (×6): 200 mL

## 2015-01-14 NOTE — Progress Notes (Signed)
Patient ID: Alexander Berry, male   DOB: [birth year], 49 y.o.   MRN: [id number]    Subjective:  Intubated sedated cooling blanket Hemodynamics stable   Objective:  Filed Vitals:   01/14/15 0400 01/14/15 0500 01/14/15 0600 01/14/15 0700  BP: 114/63 131/85 182/98 158/70  Pulse:      Temp: 100.8 F (38.2 C) 100.2 F (37.9 C) 98.6 F (37 C) 97 F (36.1 C)  TempSrc: Core (Comment) Core (Comment) Core (Comment) Core (Comment)  Resp: 34 22 23 20   Height:      Weight:      SpO2: 95% 100% 98% 88%    Intake/Output from previous day:  Intake/Output Summary (Last 24 hours) at 01/14/15 0800 Last data filed at 01/14/15 0700  Gross per 24 hour  Intake 3163.87 ml  Output   2635 ml  Net 528.87 ml    Physical Exam: Sedated  Hispanic male  HEENT:  right IJ catheter  NG tube  Neck supple with no adenopathy JVP normal no bruits no thyromegaly Lungs clear with no wheezing and good diaphragmatic motion Heart:  S1/S2 no murmur, no rub, gallop or click PMI normal Abdomen: benighn, BS positve, no tenderness, no AAA no bruit.  No HSM or HJR  RFA catheter removed no hematoma  Distal pulses intact with no bruits Cooled    Lab Results: Basic Metabolic Panel:  Recent Labs  30/86/57 0415 01/14/15 0441  NA 146* 147*  K 3.9 3.7  CL 109 111  CO2 27 26  GLUCOSE 121* 136*  BUN 64* 78*  CREATININE 2.98* 2.43*  CALCIUM 8.1* 8.0*   Liver Function Tests:  Recent Labs  01/13/15 1100 01/14/15 0441  AST 126* 121*  ALT 223* 216*  ALKPHOS 47 57  BILITOT 2.4* 2.9*  PROT 5.1* 5.5*  ALBUMIN 2.1* 2.2*   CBC:  Recent Labs  01/13/15 0415 01/14/15 0441  WBC 5.0 8.0  HGB 7.9* 8.0*  HCT 23.7* 24.2*  MCV 84.9 84.3  PLT 149* 125*   Cardiac Enzymes: No results for input(s): CKTOTAL, CKMB, CKMBINDEX, TROPONINI in the last 72 hours.  Imaging: Dg Chest Port 1 View  01/14/2015   CLINICAL DATA:  Respiratory failure  EXAM: PORTABLE CHEST - 1 VIEW  COMPARISON:  01/13/2015  FINDINGS: The  endotracheal tube is 2.3 cm above the carina. The right jugular central line extends into the SVC. Multifocal airspace consolidation persists although there does appear to be partial clearing on the right. No pneumothorax.  IMPRESSION: Support equipment appears satisfactorily positioned.  Partial clearance of right-sided airspace consolidation. No significant change on the left.   Electronically Signed   By: Ellery Plunk M.D.   On: 01/14/2015 05:19   Dg Chest Port 1 View  01/13/2015   CLINICAL DATA:  Respiratory failure  EXAM: PORTABLE CHEST - 1 VIEW  COMPARISON:  01/12/2015  FINDINGS: The endotracheal tube is 4.2 cm above the carina. The nasogastric tube extends into the stomach. The right jugular central line extends into the SVC. There is no pneumothorax. There are multifocal airspace opacities with confluent consolidation in the right upper lobe and left lower lobe. No large effusion is evident.  IMPRESSION: Support equipment appears satisfactorily positioned.  No significant interval change in the bilateral airspace opacities.   Electronically Signed   By: Ellery Plunk M.D.   On: 01/13/2015 05:35   Dg Chest Port 1 View  01/12/2015   CLINICAL DATA:  Respiratory failure and accidental self extubation with re-intubation performed.  EXAM: PORTABLE  CHEST - 1 VIEW  COMPARISON:  0430 hours  FINDINGS: The new endotracheal tube is appropriately positioned with the tip approximately 2 cm above the carina. Right jugular central line tip is stable in position within the upper SVC. Lungs show persistent dense airspace edema. Compared to the film earlier this morning, there is very slight improved aeration in the right central and lower lung. No pneumothorax or significant pleural fluid present. The heart size and mediastinal contours are stable.  IMPRESSION: New endotracheal tube tip lies 2 cm above the carina. Since the prior study, there is some improved aeration in the right central and lower lung.    Electronically Signed   By: Irish Lack M.D.   On: 01/12/2015 18:53   US Abdomen Limited Ruq  01/13/2015   CLINICAL DATA:  Hyperbilirubinemia  EXAM: US ABDOMEN LIMITED - RIGHT UPPER QUADRANT  COMPARISON:  None.  FINDINGS: Gallbladder:  Sludge noted within the gallbladder. No stones. No wall thickening. Negative sonographic Murphy's.  Common bile duct:  Diameter: Normal caliber, 2 mm.  Liver:  Increased echotexture throughout the liver suggesting fatty infiltration. No focal abnormality or biliary ductal dilatation.  IMPRESSION: Sludge within the gallbladder.  No visible stones.  Increased echotexture throughout the liver compatible with fatty infiltration.   Electronically Signed   By: Charlett Nose M.D.   On: 01/13/2015 17:01    Cardiac Studies:  ECG:  SR rate 50 evolving IMI with persistent lateral ST depression    Telemetry: SR less tachycardic  01/14/2015   Echo:  6/6  EF 55-60% mild MR reviewed   Medications:   . antiseptic oral rinse  7 mL Mouth Rinse QID  . aspirin  81 mg Per Tube Daily  . cefTAZidime (FORTAZ)  IV  2 g Intravenous Q12H  . chlorhexidine  15 mL Mouth Rinse BID  . ciprofloxacin  400 mg Intravenous Q12H  . famotidine  20 mg Per Tube Daily  . feeding supplement (PRO-STAT SUGAR FREE 64)  30 mL Per Tube TID  . insulin aspart  0-15 Units Subcutaneous 6 times per day  . linezolid  600 mg Intravenous Q12H  . sodium chloride  3 mL Intravenous Q12H  . ticagrelor  90 mg Per Tube BID     . sodium chloride 10 mL/hr at 01/14/15 0442  . dexmedetomidine 1.2 mcg/kg/hr (01/14/15 0757)  . feeding supplement (VITAL HIGH PROTEIN) 1,000 mL (01/13/15 1900)  . fentaNYL infusion INTRAVENOUS 100 mcg/hr (01/14/15 0650)  . norepinephrine (LEVOPHED) Adult infusion Stopped (01/13/15 1846)    Assessment/Plan:  IMI:  Complicated by cardiac arrest Long area stenting mid RCA Continue ticagrelor.    Echo with normal RV/LV function Troponins very high Amiodarone stopped repeat echo EF 50% no  effusion   Cardiac Arrest  Coolong protocol  Neuro status may be ok as he seems to communicate when not sedated   CCM:  Care per for cooling access and vent  F/U CXR ? ARDS higher oxygen requirements  I/O negative consider transfusion given Hct Drop  Sepsis and pneumonia seem to be big issue now continue cirpo and fortaz  No longer needing Levophed  [name] 01/14/2015, 8:00 AM

## 2015-01-14 NOTE — Progress Notes (Signed)
PULMONARY / CRITICAL CARE MEDICINE   Name: Alexander Berry MRN: [id number] DOB: 1965/10/06    ADMISSION DATE:  01/08/2015 CONSULTATION DATE:  01/08/2015    PT PROFILE:   VF arrest while playing soccer 6/05. Prolonged ACLS. DES to RCA. Hypothermia protocol  MAJOR EVENTS/TEST RESULTS: 6/05 Admitted after OOH arrest 6/05 LHC: Dist RCA lesion, 80% stenosed. Mid RCA lesion, 100% stenosed. There is a 0% residual stenosis post intervention. A drug-eluting stent was placed. 2nd RPLB-1 lesion, 100% stenosed. 2nd RPLB-2 lesion, 100% stenosed. There is a 0% residual stenosis post intervention. RPDA lesion, 80% stenosed. There is a 0% residual stenosis post intervention.  6/06 EEG: Abnormal EEG due to the presence of a generalized slowing indicating a mild to moderate cerebral disturbance (encephalopathy). No epileptiform activity noted 6/06 TTE: The estimated ejection fraction was in the range of 55% to 60%. Mild posterior wall hypokinesis - decreased Definity microsphere uptake in the posterior wall suggests hypoperfusion  6/07 Rewarmed. Continuous sedatives stopped. RASS -5. No spont movement 6/07 CT head: NAD 6/07 repeat EEG: Abnormal EEG due to the presence of a generalized slowing indicating a mild to moderate cerebral disturbance (encephalopathy). No epileptiform activity noted 6/08 Increased agitation requiring sedation. Not F/C. Increased O2 reqts. Frothy pink sputum. Resp culture obatined. PCT elevated. Empiric abx initiated. CXR c/w increased pulm edema. Hypertensive. Furosemide ordered 6/08 repeat limited TTE: LVEF 50%. Inferior wall AK 6/09 Intermittently F/C. RASS -2 to +1. Requiring 70% O2. Dexmedetomidine initiated 6/09 PM: self extubated, required immediate re-intubation. High fevers - cooling blanket initiated. Hypotension - NE initiated 6/10 High fevers persist. Arctic sun device re-ordered to maintain T < 99.5. PCT remained elevated. Abx adjusted. Cr rising. New dx of severe  sepsis/septic shock rendered. Elevated bilirubin  6/10 RUQ [place]: biliary sludge. No obstruction 6/11 Off vasopressors. Fevers controlled. Cr improving. No WUA due to increasing agitation  INDWELLING DEVICES: Femoral sheath 6/05 >> 6/07 ETT 6/05 >>  R IJ CVL 6/05 >>    MICRO DATA: Resp 6/08 >> MSSA resp 6/10 >>  Blood 6/10 >>   PCT 6/08: 7.46           6/09: 8.90          6/10: 9.80          6/12:   ANTIMICROBIALS:  Vanc 6/08 >> 6/20 Linezolid 6/10 >> 6/11  Ceftazidime 6/08 >>  Ciprofloxacin 6/10 >>   SUBJ: RASS -2, not F/C. Remains on dex/fent gtt.    VITAL SIGNS: Temp:  [97 F (36.1 C)-100.8 F (38.2 C)] 98.6 F (37 C) (06/11 1200) Pulse Rate:  [74-91] 91 (06/11 0750) Resp:  [17-40] 23 (06/11 1200) BP: (102-234)/(63-99) 142/84 mmHg (06/11 1200) SpO2:  [88 %-100 %] 98 % (06/11 1200) FiO2 (%):  [50 %-70 %] 50 % (06/11 1150) Weight:  [93.21 kg (205 lb 7.9 oz)] 93.21 kg (205 lb 7.9 oz) (06/11 0315) HEMODYNAMICS: CVP:  [8 mmHg-13 mmHg] 8 mmHg VENTILATOR SETTINGS: Vent Mode:  [-] PRVC FiO2 (%):  [50 %-70 %] 50 % Set Rate:  [14 bmp] 14 bmp Vt Set:  [600 mL] 600 mL PEEP:  [10 cmH20] 10 cmH20 Plateau Pressure:  [23 cmH20-31 cmH20] 26 cmH20 INTAKE / OUTPUT:  Intake/Output Summary (Last 24 hours) at 01/14/15 1310 Last data filed at 01/14/15 1220  Gross per 24 hour  Intake 3565.8 ml  Output   2645 ml  Net  920.8 ml    PHYSICAL EXAMINATION: General: RASS -2, + F/C Neuro: MAEs, no focal  deficits HEENT: NCAT, WNL Cardiovascular:  Reg, no M Lungs: coarse throughout, no wheezes Abdomen: soft, diminished BS Ext: warm, no LE edema  LABS:  PULMONARY  Recent Labs Lab 01/08/15 2106 01/09/15 0340 01/09/15 0741 01/09/15 1229 01/09/15 1611  PHART  --  7.215*  --   --   --   PCO2ART  --  36.9  --   --   --   PO2ART  --  292*  --   --   --   HCO3  --  14.4*  --   --   --   TCO2 12 15.5 15 15 16   O2SAT  --  99.2  --   --   --     CBC  Recent Labs Lab  01/12/15 0450 01/13/15 0415 01/14/15 0441  HGB 10.0* 7.9* 8.0*  HCT 30.6* 23.7* 24.2*  WBC 5.8 5.0 8.0  PLT 170 149* 125*    COAGULATION  Recent Labs Lab 01/08/15 1515 01/08/15 1845 01/08/15 2300  INR 1.54* >10.00* 1.70*    CARDIAC    Recent Labs Lab 01/08/15 2300 01/09/15 0050 01/09/15 0751 01/09/15 1200 01/11/15 0435  TROPONINI 58.08* 62.31* >65.00* >65.00* 57.38*   No results for input(s): PROBNP in the last 168 hours.   CHEMISTRY  Recent Labs Lab 01/09/15 0445  01/10/15 0735 01/11/15 0435 01/12/15 0450 01/13/15 0415 01/14/15 0441  NA 140  < > 139 140 140 146* 147*  K 4.0  < > 4.4 4.7 4.2 3.9 3.7  CL 113*  < > 113* 110 107 109 111  CO2 18*  < > 20* 21* 25 27 26   GLUCOSE 154*  < > 103* 125* 140* 121* 136*  BUN 19  < > 22* 31* 42* 64* 78*  CREATININE 2.11*  < > 1.91* 2.17* 2.00* 2.98* 2.43*  CALCIUM 7.8*  < > 7.9* 8.1* 8.2* 8.1* 8.0*  MG 2.2  --   --   --   --   --   --   PHOS 2.3*  --   --   --   --   --   --   < > = values in this interval not displayed. Estimated Creatinine Clearance: 40.9 mL/min (by C-G formula based on Cr of 2.43).   LIVER  Recent Labs Lab 01/08/15 1515 01/08/15 1845 01/08/15 2300 01/09/15 0445 01/11/15 0435 01/13/15 0415 01/13/15 1100 01/14/15 0441  AST 848*  --   --  1757* 465* 137* 126* 121*  ALT 1428*  --   --  1352* 680* 255* 223* 216*  ALKPHOS 70  --   --  53 53 47 47 57  BILITOT 0.6  --   --  0.8 1.2 2.4* 2.4* 2.9*  PROT 6.2*  --   --  6.1* 6.1* 5.3* 5.1* 5.5*  ALBUMIN 3.8  --   --  3.4* 3.1* 2.3* 2.1* 2.2*  INR 1.54* >10.00* 1.70*  --   --   --   --   --      INFECTIOUS  Recent Labs Lab 01/08/15 1920 01/11/15 0435 01/12/15 0450 01/13/15 0415  LATICACIDVEN 9.3*  --   --   --   PROCALCITON  --  7.46 8.90 9.80     ENDOCRINE CBG (last 3)   Recent Labs  01/14/15 0426 01/14/15 0739 01/14/15 1220  GLUCAP 122* 109* 100*     CXR: NSC to slightly improved ARDS pattern   ASSESSMENT /  PLAN: CARDIOVASCULAR A:  Acute STEMI 6/05  Out of hospital VF arrest 6/05 Cardiogenic shock, resolved Hypertension Septic shock 6/09, resolved 6/11 P:  Cards following MAP goal 65-80 mmHg  PULMONARY A:  VDRF post arrest 6/05 Pulm edema 6/06 Staph PNA 6/08 ARDS 6/09 P:   Cont vent support - settings reviewed and/or adjusted Cont vent bundle Daily SBT if/when meets criteria Daily CXR for now Consider further diuresis 6/12  RENAL A:  AKI, nonoliguric 6/05 ? CKD (Cr 2.15 on admission) Hypokalemia, resolved Hypernatremia P:   Monitor BMET intermittently Monitor I/Os Correct electrolytes as indicated  GASTROINTESTINAL A:   Shock liver - improving transaminases Hyperbilirubinemia P:   SUP: enteral famotidine Cont TFs Monitor LFTs intermittently  HEMATOLOGIC A:   Anemia without overt blood loss Mild thrombocytopenia P:  DVT px: Brilinta, SCDs Monitor CBC intermittently Transfuse per usual guidelines  INFECTIOUS A:   Elevated PCT Staph PNA, NOS R/O cholecystitis P:   Monitor temp, WBC count Micro and abx as above Recheck PCT 6/12  ENDOCRINE A:   Hyperglycemia without prior dx of DM P:   Cont SSI  NEUROLOGIC A:   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  After 5:30 PM or weekends, call [phone]

## 2015-01-15 ENCOUNTER — Inpatient Hospital Stay (HOSPITAL_COMMUNITY): Payer: Medicaid Other

## 2015-01-15 DIAGNOSIS — N179 Acute kidney failure, unspecified: Secondary | ICD-10-CM | POA: Diagnosis present

## 2015-01-15 LAB — CBC
HCT: 26 % — ABNORMAL LOW (ref 39.0–52.0)
Hemoglobin: 8.6 g/dL — ABNORMAL LOW (ref 13.0–17.0)
MCH: 27.8 pg (ref 26.0–34.0)
MCHC: 33.1 g/dL (ref 30.0–36.0)
MCV: 84.1 fL (ref 78.0–100.0)
Platelets: 136 10*3/uL — ABNORMAL LOW (ref 150–400)
RBC: 3.09 MIL/uL — ABNORMAL LOW (ref 4.22–5.81)
RDW: 16.4 % — ABNORMAL HIGH (ref 11.5–15.5)
WBC: 10.5 10*3/uL (ref 4.0–10.5)

## 2015-01-15 LAB — GLUCOSE, CAPILLARY
GLUCOSE-CAPILLARY: 113 mg/dL — AB (ref 65–99)
GLUCOSE-CAPILLARY: 132 mg/dL — AB (ref 65–99)
Glucose-Capillary: 113 mg/dL — ABNORMAL HIGH (ref 65–99)
Glucose-Capillary: 134 mg/dL — ABNORMAL HIGH (ref 65–99)
Glucose-Capillary: 111 mg/dL — ABNORMAL HIGH (ref 65–99)

## 2015-01-15 LAB — CULTURE, RESPIRATORY W GRAM STAIN

## 2015-01-15 LAB — CULTURE, RESPIRATORY

## 2015-01-15 LAB — COMPREHENSIVE METABOLIC PANEL
ALBUMIN: 2.2 g/dL — AB (ref 3.5–5.0)
ALT: 173 U/L — AB (ref 17–63)
AST: 84 U/L — ABNORMAL HIGH (ref 15–41)
Alkaline Phosphatase: 69 U/L (ref 38–126)
Anion gap: 11 (ref 5–15)
BILIRUBIN TOTAL: 3 mg/dL — AB (ref 0.3–1.2)
BUN: 58 mg/dL — ABNORMAL HIGH (ref 6–20)
CALCIUM: 8.3 mg/dL — AB (ref 8.9–10.3)
CO2: 25 mmol/L (ref 22–32)
CREATININE: 1.58 mg/dL — AB (ref 0.61–1.24)
Chloride: 114 mmol/L — ABNORMAL HIGH (ref 101–111)
GFR calc Af Amer: 59 mL/min — ABNORMAL LOW (ref >60)
GFR, EST NON AFRICAN AMERICAN: 51 mL/min — AB (ref >60)
GLUCOSE: 117 mg/dL — AB (ref 65–99)
POTASSIUM: 3.4 mmol/L — AB (ref 3.5–5.1)
SODIUM: 150 mmol/L — AB (ref 135–145)
TOTAL PROTEIN: 5.4 g/dL — AB (ref 6.5–8.1)

## 2015-01-15 LAB — PROCALCITONIN: PROCALCITONIN: 5.15 ng/mL

## 2015-01-15 MED ORDER — CEFTRIAXONE SODIUM IN DEXTROSE 40 MG/ML IV SOLN
2.0000 g | INTRAVENOUS | Status: DC
  Administered 2015-01-15 – 2015-01-17 (×3): 2 g via INTRAVENOUS
  Filled 2015-01-15 (×4): qty 50

## 2015-01-15 MED ORDER — CLONAZEPAM 0.1 MG/ML ORAL SUSPENSION: 0.5000 mg | Freq: Two times a day (BID) | ORAL | Status: DC

## 2015-01-15 MED ORDER — METOPROLOL TARTRATE 25 MG/10 ML ORAL SUSPENSION
25.0000 mg | Freq: Two times a day (BID) | ORAL | Status: DC
Start: 2015-01-15 — End: 2015-01-17
  Administered 2015-01-15 – 2015-01-16 (×4): 25 mg
  Filled 2015-01-15 (×6): qty 10

## 2015-01-15 MED ORDER — CLONAZEPAM 0.5 MG PO TABS
0.5000 mg | ORAL_TABLET | Freq: Two times a day (BID) | ORAL | Status: DC
  Administered 2015-01-15 (×2): 0.5 mg
  Filled 2015-01-15 (×2): qty 1

## 2015-01-15 MED ORDER — FUROSEMIDE 10 MG/ML IJ SOLN
40.0000 mg | Freq: Once | INTRAMUSCULAR | Status: AC
  Administered 2015-01-15: 40 mg via INTRAVENOUS
  Filled 2015-01-15: qty 4

## 2015-01-15 MED ORDER — POTASSIUM CHLORIDE 2 MEQ/ML IV SOLN
INTRAVENOUS | Status: DC
  Administered 2015-01-15 – 2015-01-16 (×3): via INTRAVENOUS
  Filled 2015-01-15 (×5): qty 1000

## 2015-01-15 NOTE — Progress Notes (Signed)
eLink Physician-Brief Progress Note Patient Name: Alexander Berry DOB: Nov 14, 1965 MRN: [id number]   Date of Service  01/15/2015  HPI/Events of Note  Patient with drop in sats and increase in RR.  Is tachy/hypertensive.  Findings are all suggestive of agitation/pain.  Currently on precedex max, fentanyl at 200 mcg and receiving prn fentanyl/versed  eICU Interventions  Plan: Increase max fentanyl gtt to 300 mcg Continue to monitor     Intervention Category Major Interventions: Delirium, psychosis, severe agitation - evaluation and management  Shaquna Geigle 01/15/2015, 3:34 AM

## 2015-01-15 NOTE — Progress Notes (Addendum)
PULMONARY / CRITICAL CARE MEDICINE   Name: Alexander Berry MRN: [id number] DOB: Jun 27, 1966    ADMISSION DATE:  01/08/2015 CONSULTATION DATE:  01/08/2015    PT PROFILE:   VF arrest while playing soccer 6/05. Prolonged ACLS. DES to RCA. Hypothermia protocol  MAJOR EVENTS/TEST RESULTS: 6/05 Admitted after OOH arrest 6/05 LHC: Dist RCA lesion, 80% stenosed. Mid RCA lesion, 100% stenosed. There is a 0% residual stenosis post intervention. A drug-eluting stent was placed. 2nd RPLB-1 lesion, 100% stenosed. 2nd RPLB-2 lesion, 100% stenosed. There is a 0% residual stenosis post intervention. RPDA lesion, 80% stenosed. There is a 0% residual stenosis post intervention.  6/06 EEG: Abnormal EEG due to the presence of a generalized slowing indicating a mild to moderate cerebral disturbance (encephalopathy). No epileptiform activity noted 6/06 TTE: The estimated ejection fraction was in the range of 55% to 60%. Mild posterior wall hypokinesis - decreased Definity microsphere uptake in the posterior wall suggests hypoperfusion  6/07 Rewarmed. Continuous sedatives stopped. RASS -5. No spont movement 6/07 CT head: NAD 6/07 repeat EEG: Abnormal EEG due to the presence of a generalized slowing indicating a mild to moderate cerebral disturbance (encephalopathy). No epileptiform activity noted 6/08 Increased agitation requiring sedation. Not F/C. Increased O2 reqts. Frothy pink sputum. Resp culture obatined. PCT elevated. Empiric abx initiated. CXR c/w increased pulm edema. Hypertensive. Furosemide ordered 6/08 repeat limited TTE: LVEF 50%. Inferior wall AK 6/09 Intermittently F/C. RASS -2 to +1. Requiring 70% O2. Dexmedetomidine initiated 6/09 PM: self extubated, required immediate re-intubation. High fevers - cooling blanket initiated. Hypotension - NE initiated 6/10 High fevers persist. Arctic sun device re-ordered to maintain T < 99.5. PCT remained elevated. Abx adjusted. Cr rising. New dx of severe  sepsis/septic shock rendered. Elevated bilirubin  6/10 RUQ [place]: biliary sludge. No obstruction 6/11 Off vasopressors. Fevers controlled. Cr improving. No WUA due to increasing agitation 6/12 ARDS persists. AKI improving. Worsening hypernatremia - D5W initiated  INDWELLING DEVICES: Femoral sheath 6/05 >> 6/07 ETT 6/05 >>  R IJ CVL 6/05 >>    MICRO DATA: Resp 6/08 >> MSSA resp 6/10 >> MSSA  Blood 6/10 >>   PCT 6/08: 7.46           6/09: 8.90          6/10: 9.80          6/12: 5.15  ANTIMICROBIALS:  Vanc 6/08 >> 6/20 Linezolid 6/10 >> 6/11  Ciprofloxacin 6/10 >> 6/12 Ceftazidime 6/08 >> 6/12 Ceftriaxone 6/12 >>    SUBJ: RASS -2, + F/C for family (does better with Spanish commands than [place]). Remains on dex/fent gtt.    VITAL SIGNS: Temp:  [97.7 F (36.5 C)-99.1 F (37.3 C)] 98.8 F (37.1 C) (06/12 1200) Pulse Rate:  [76-100] 100 (06/12 0810) Resp:  [17-35] 22 (06/12 1200) BP: (118-196)/(59-102) 141/73 mmHg (06/12 1200) SpO2:  [91 %-100 %] 100 % (06/12 1200) FiO2 (%):  [50 %] 50 % (06/12 1200) HEMODYNAMICS: CVP:  [8 mmHg-10 mmHg] 10 mmHg VENTILATOR SETTINGS: Vent Mode:  [-] PRVC FiO2 (%):  [50 %] 50 % Set Rate:  [14 bmp] 14 bmp Vt Set:  [600 mL] 600 mL PEEP:  [10 cmH20] 10 cmH20 Plateau Pressure:  [23 cmH20-28 cmH20] 28 cmH20 INTAKE / OUTPUT:  Intake/Output Summary (Last 24 hours) at 01/15/15 1230 Last data filed at 01/15/15 1200  Gross per 24 hour  Intake 3644.04 ml  Output   3340 ml  Net 304.04 ml    PHYSICAL EXAMINATION: General: RASS -2, +  F/C Neuro: MAEs, no focal deficits HEENT: NCAT, WNL Cardiovascular:  Reg, no M Lungs: coarse throughout, no wheezes Abdomen: soft, diminished BS Ext: warm, no LE edema  LABS:  PULMONARY  Recent Labs Lab 01/08/15 2106 01/09/15 0340 01/09/15 0741 01/09/15 1229 01/09/15 1611  PHART  --  7.215*  --   --   --   PCO2ART  --  36.9  --   --   --   PO2ART  --  292*  --   --   --   HCO3  --  14.4*  --    --   --   TCO2 12 15.5 15 15 16   O2SAT  --  99.2  --   --   --     CBC  Recent Labs Lab 01/13/15 0415 01/14/15 0441 01/15/15 0500  HGB 7.9* 8.0* 8.6*  HCT 23.7* 24.2* 26.0*  WBC 5.0 8.0 10.5  PLT 149* 125* 136*    COAGULATION  Recent Labs Lab 01/08/15 1515 01/08/15 1845 01/08/15 2300  INR 1.54* >10.00* 1.70*    CARDIAC    Recent Labs Lab 01/08/15 2300 01/09/15 0050 01/09/15 0751 01/09/15 1200 01/11/15 0435  TROPONINI 58.08* 62.31* >65.00* >65.00* 57.38*   No results for input(s): PROBNP in the last 168 hours.   CHEMISTRY  Recent Labs Lab 01/09/15 0445  01/11/15 0435 01/12/15 0450 01/13/15 0415 01/14/15 0441 01/15/15 0500  NA 140  < > 140 140 146* 147* 150*  K 4.0  < > 4.7 4.2 3.9 3.7 3.4*  CL 113*  < > 110 107 109 111 114*  CO2 18*  < > 21* GLUCOSE 154*  < > 125* 140* 121* 136* 117*  BUN 19  < > 31* 42* 64* 78* 58*  CREATININE 2.11*  < > 2.17* 2.00* 2.98* 2.43* 1.58*  CALCIUM 7.8*  < > 8.1* 8.2* 8.1* 8.0* 8.3*  MG 2.2  --   --   --   --   --   --   PHOS 2.3*  --   --   --   --   --   --   < > = values in this interval not displayed. Estimated Creatinine Clearance: 62.9 mL/min (by C-G formula based on Cr of 1.58).   LIVER  Recent Labs Lab 01/08/15 1515 01/08/15 1845 01/08/15 2300  01/11/15 0435 01/13/15 0415 01/13/15 1100 01/14/15 0441 01/15/15 0500  AST 848*  --   --   < > 465* 137* 126* 121* 84*  ALT 1428*  --   --   < > 680* 255* 223* 216* 173*  ALKPHOS 70  --   --   < > 53 47 47 57 69  BILITOT 0.6  --   --   < > 1.2 2.4* 2.4* 2.9* 3.0*  PROT 6.2*  --   --   < > 6.1* 5.3* 5.1* 5.5* 5.4*  ALBUMIN 3.8  --   --   < > 3.1* 2.3* 2.1* 2.2* 2.2*  INR 1.54* >10.00* 1.70*  --   --   --   --   --   --   < > = values in this interval not displayed.   INFECTIOUS  Recent Labs Lab 01/08/15 1920  01/12/15 0450 01/13/15 0415 01/15/15 0500  LATICACIDVEN 9.3*  --   --   --   --   PROCALCITON  --   < > 8.90 9.80 5.15  < >  =  values in this interval not displayed.   ENDOCRINE CBG (last 3)   Recent Labs  01/15/15 0502 01/15/15 0725 01/15/15 1143  GLUCAP 113* 113* 134*     CXR: NSC ARDS pattern   ASSESSMENT / PLAN: CARDIOVASCULAR A:  Acute STEMI 6/05 Out of hospital VF arrest 6/05 Cardiogenic shock, resolved Hypertension, controlled Mild sinus tachycardia Septic shock 6/09, resolved 6/11 P:  Cards following MAP goal 65-80 mmHg Metoprolol initiated 6/12  PULMONARY A:  VDRF post arrest 6/05 Pulm edema 6/06 Staph PNA 6/08 ARDS 6/09 P:   Cont vent support - settings reviewed and/or adjusted Cont vent bundle Daily SBT when meets criteria Daily CXR for now Lasix X 1 6/12  RENAL A:  AKI, nonoliguric 6/05 ? CKD (Cr 2.15 on admission) Hypokalemia, recurrent Hypernatremia - worsening  Suspect post ATN diuresis P:   Monitor BMET intermittently Monitor I/Os Correct electrolytes as indicated D5W + KCL initiated 6/12 - need to stop once Na corrects  GASTROINTESTINAL A:   Shock liver - improving transaminases Hyperbilirubinemia Biliary sludge by RUQ [place] 6/10 P:   SUP: enteral famotidine Cont TFs Monitor LFTs intermittently  HEMATOLOGIC A:   Anemia without overt blood loss Mild thrombocytopenia P:  DVT px: Brilinta, SCDs Monitor CBC intermittently Transfuse per usual guidelines  INFECTIOUS A:   Elevated PCT MSSA PNA High fever P:   Monitor temp, WBC count Micro and abx as above Cont [employer] pads to control temp  Avoid acetaminophen due to elevated LFTs  ENDOCRINE A:   Hyperglycemia without prior dx of DM P:   Cont SSI  NEUROLOGIC A:   Anoxic encephalopathy - prognosis appears to be good ICU associated discomfort P:   RASS goal: -1, -2 Cont precedex 6/09 -  Cont fentanyl gtt for RASS -1, -2 Daily WUA   FAMILY  Family updated in detail 6/12  CCM X 35 mins   Billy Fischer, MD ; [facility] service Mobile [phone].  After 5:30 PM or weekends, call  [phone]

## 2015-01-15 NOTE — Progress Notes (Signed)
Patient ID: Alexander Berry, male   DOB: Jan 04, 1966, 49 y.o.   MRN: [id number]    Subjective:  Slow improvement Less sedation but gets very agitated.  Son and daughter help calm him On 50% no still 10 peep.  Cr improved.    Objective:  Filed Vitals:   01/15/15 0830 01/15/15 0900 01/15/15 0930 01/15/15 1000  BP: 154/76 173/82 143/69 142/74  Pulse:      Temp:    99.1 F (37.3 C)  TempSrc:      Resp: 23 35 22 17  Height:      Weight:      SpO2: 96% 97% 94% 98%    Intake/Output from previous day:  Intake/Output Summary (Last 24 hours) at 01/15/15 1159 Last data filed at 01/15/15 1000  Gross per 24 hour  Intake 3720.74 ml  Output   3325 ml  Net 395.74 ml    Physical Exam: Sedated  Hispanic male  HEENT:  right IJ catheter  NG tube  Neck supple with no adenopathy JVP normal no bruits no thyromegaly Lungs clear with no wheezing and good diaphragmatic motion Heart:  S1/S2 no murmur, no rub, gallop or click PMI normal Abdomen: benighn, BS positve, no tenderness, no AAA no bruit.  No HSM or HJR  RFA catheter removed no hematoma  Distal pulses intact with no bruits Cooled    Lab Results: Basic Metabolic Panel:  Recent Labs  76/81/15 0441 01/15/15 0500  NA 147* 150*  K 3.7 3.4*  CL 111 114*  CO2 26 25  GLUCOSE 136* 117*  BUN 78* 58*  CREATININE 2.43* 1.58*  CALCIUM 8.0* 8.3*   Liver Function Tests:  Recent Labs  01/14/15 0441 01/15/15 0500  AST 121* 84*  ALT 216* 173*  ALKPHOS 57 69  BILITOT 2.9* 3.0*  PROT 5.5* 5.4*  ALBUMIN 2.2* 2.2*   CBC:  Recent Labs  01/14/15 0441 01/15/15 0500  WBC 8.0 10.5  HGB 8.0* 8.6*  HCT 24.2* 26.0*  MCV 84.3 84.1  PLT 125* 136*   Cardiac Enzymes: No results for input(s): CKTOTAL, CKMB, CKMBINDEX, TROPONINI in the last 72 hours.  Imaging: Dg Chest Port 1 View  01/15/2015   CLINICAL DATA:  Respiratory failure.  EXAM: PORTABLE CHEST - 1 VIEW  COMPARISON:  1 day prior  FINDINGS: Endotracheal tube terminates 2.7 cm  above carina. Right internal jugular line unchanged at mid SVC. Nasogastric tube extends beyond the inferior aspect of the film. Cardiomegaly accentuated by AP portable technique. No pleural effusion or pneumothorax. Patchy airspace opacities persist bilaterally. Not significantly changed.  IMPRESSION: No change in patchy bilateral airspace disease, suspicious for infection versus developing ARDS.   Electronically Signed   By: Jeronimo Greaves M.D.   On: 01/15/2015 09:00   Dg Chest Port 1 View  01/14/2015   CLINICAL DATA:  Respiratory failure  EXAM: PORTABLE CHEST - 1 VIEW  COMPARISON:  01/13/2015  FINDINGS: The endotracheal tube is 2.3 cm above the carina. The right jugular central line extends into the SVC. Multifocal airspace consolidation persists although there does appear to be partial clearing on the right. No pneumothorax.  IMPRESSION: Support equipment appears satisfactorily positioned.  Partial clearance of right-sided airspace consolidation. No significant change on the left.   Electronically Signed   By: Ellery Plunk M.D.   On: 01/14/2015 05:19   US Abdomen Limited Ruq  01/13/2015   CLINICAL DATA:  Hyperbilirubinemia  EXAM: US ABDOMEN LIMITED - RIGHT UPPER QUADRANT  COMPARISON:  None.  FINDINGS: Gallbladder:  Sludge noted within the gallbladder. No stones. No wall thickening. Negative sonographic Murphy's.  Common bile duct:  Diameter: Normal caliber, 2 mm.  Liver:  Increased echotexture throughout the liver suggesting fatty infiltration. No focal abnormality or biliary ductal dilatation.  IMPRESSION: Sludge within the gallbladder.  No visible stones.  Increased echotexture throughout the liver compatible with fatty infiltration.   Electronically Signed   By: Charlett Nose M.D.   On: 01/13/2015 17:01    Cardiac Studies:  ECG:  SR rate 50 evolving IMI with persistent lateral ST depression    Telemetry: SR less tachycardic  01/15/2015   Echo:  6/6  EF 55-60% mild MR reviewed    Medications:   . antiseptic oral rinse  7 mL Mouth Rinse QID  . aspirin  81 mg Per Tube Daily  . cefTAZidime (FORTAZ)  IV  2 g Intravenous Q12H  . chlorhexidine  15 mL Mouth Rinse BID  . clonazePAM  0.5 mg Per Tube BID  . famotidine  20 mg Per Tube Daily  . feeding supplement (PRO-STAT SUGAR FREE 64)  30 mL Per Tube TID  . free water  200 mL Per Tube 3 times per day  . insulin aspart  0-15 Units Subcutaneous 6 times per day  . metoprolol tartrate  25 mg Per Tube BID  . ticagrelor  90 mg Per Tube BID     . sodium chloride 10 mL/hr at 01/15/15 0535  . dexmedetomidine 1 mcg/kg/hr (01/15/15 0900)  . dextrose 5 % with kcl 100 mL/hr at 01/15/15 1157  . feeding supplement (VITAL HIGH PROTEIN) 1,000 mL (01/15/15 0657)  . fentaNYL infusion INTRAVENOUS 200 mcg/hr (01/15/15 1058)    Assessment/Plan:  IMI:  Complicated by cardiac arrest Long area stenting mid RCA Continue ticagrelor.    Echo with normal RV/LV function Troponins very high Amiodarone stopped repeat echo EF 50% no effusion Getting ticagrelor per tube  Cardiac Arrest  [place] protocol  Neuro status may be ok as he seems to communicate when not sedated   CCM:  Neuro status still unknown but clearly responding.  Continue Rx ? Pneumonia / sepsis.  On Fortaz.  Cr improving Hopefully can avoid trach  [name] 01/15/2015, 11:59 AM

## 2015-01-15 NOTE — Progress Notes (Addendum)
ANTIBIOTIC CONSULT NOTE - FOLLOW UP  Pharmacy Consult for ceftazidime>>CTX Indication: MSSA PNA  Not on File  Patient Measurements: Height: 5\' 7"  (170.2 cm) Weight: 205 lb 7.9 oz (93.21 kg) IBW/kg (Calculated) : 66.1  Vital Signs: Temp: 98.8 F (37.1 C) (06/12 1200) Temp Source: Core (Comment) (06/12 1200) BP: 112/59 mmHg (06/12 1300) Pulse Rate: 87 (06/12 1215) Intake/Output from previous day: 06/11 0701 - 06/12 0700 In: 4033.8 [I.V.:1243.8; NG/GT:1990; IV Piggyback:800] Out: 3475 [Urine:3475] Intake/Output from this shift: Total I/O In: 909.2 [I.V.:409.2; NG/GT:500] Out: 1775 [Urine:1775]  Labs:  Recent Labs  01/13/15 0415 01/14/15 0441 01/15/15 0500  WBC 5.0 8.0 10.5  HGB 7.9* 8.0* 8.6*  PLT 149* 125* 136*  CREATININE 2.98* 2.43* 1.58*   Estimated Creatinine Clearance: 62.9 mL/min (by C-G formula based on Cr of 1.58). No results for input(s): VANCOTROUGH, VANCOPEAK, VANCORANDOM, GENTTROUGH, GENTPEAK, GENTRANDOM, TOBRATROUGH, TOBRAPEAK, TOBRARND, AMIKACINPEAK, AMIKACINTROU, AMIKACIN in the last 72 hours.   Microbiology: Recent Results (from the past 720 hour(s))  MRSA PCR Screening     Status: None   Collection Time: 01/09/15  9:45 AM  Result Value Ref Range Status   MRSA by PCR NEGATIVE NEGATIVE Final    Comment:        The GeneXpert MRSA Assay (FDA approved for NASAL specimens only), is one component of a comprehensive MRSA colonization surveillance program. It is not intended to diagnose MRSA infection nor to guide or monitor treatment for MRSA infections.   Culture, respiratory (NON-Expectorated)     Status: None   Collection Time: 01/11/15 11:44 AM  Result Value Ref Range Status   Specimen Description TRACHEAL ASPIRATE  Final   Special Requests NONE  Final   Gram Stain   Final    FEW WBC PRESENT,BOTH PMN AND MONONUCLEAR RARE SQUAMOUS EPITHELIAL CELLS PRESENT NO ORGANISMS SEEN Performed at [facility]    Culture   Final    ABUNDANT  STAPHYLOCOCCUS AUREUS Note: RIFAMPIN AND GENTAMICIN SHOULD NOT BE USED AS SINGLE DRUGS FOR TREATMENT OF STAPH INFECTIONS. Performed at [facility]    Report Status 01/14/2015 FINAL  Final   Organism ID, Bacteria STAPHYLOCOCCUS AUREUS  Final      Susceptibility   Staphylococcus aureus - MIC*    CLINDAMYCIN <=0.25 SENSITIVE Sensitive     ERYTHROMYCIN <=0.25 SENSITIVE Sensitive     GENTAMICIN <=0.5 SENSITIVE Sensitive     LEVOFLOXACIN <=0.12 SENSITIVE Sensitive     OXACILLIN 0.5 SENSITIVE Sensitive     PENICILLIN >=0.5 RESISTANT Resistant     RIFAMPIN <=0.5 SENSITIVE Sensitive     TRIMETH/SULFA <=10 SENSITIVE Sensitive     VANCOMYCIN 1 SENSITIVE Sensitive     TETRACYCLINE 2 SENSITIVE Sensitive     MOXIFLOXACIN <=0.25 SENSITIVE Sensitive     * ABUNDANT STAPHYLOCOCCUS AUREUS  Culture, blood (routine x 2)     Status: None (Preliminary result)   Collection Time: 01/13/15 12:13 AM  Result Value Ref Range Status   Specimen Description BLOOD RIGHT HAND  Final   Special Requests BOTTLES DRAWN AEROBIC ONLY 10CC  Final   Culture   Final           BLOOD CULTURE RECEIVED NO GROWTH TO DATE CULTURE WILL BE HELD FOR 5 DAYS BEFORE ISSUING A FINAL NEGATIVE REPORT Performed at [facility]    Report Status PENDING  Incomplete  Culture, blood (routine x 2)     Status: None (Preliminary result)   Collection Time: 01/13/15 12:22 AM  Result Value  Ref Range Status   Specimen Description BLOOD LEFT ANTECUBITAL  Final   Special Requests BOTTLES DRAWN AEROBIC ONLY 10CC  Final   Culture   Final           BLOOD CULTURE RECEIVED NO GROWTH TO DATE CULTURE WILL BE HELD FOR 5 DAYS BEFORE ISSUING A FINAL NEGATIVE REPORT Performed at [facility]    Report Status PENDING  Incomplete  Culture, respiratory (NON-Expectorated)     Status: None   Collection Time: 01/13/15 10:10 AM  Result Value Ref Range Status   Specimen Description TRACHEAL ASPIRATE  Final   Special Requests NONE  Final    Gram Stain   Final    MODERATE WBC PRESENT,BOTH PMN AND MONONUCLEAR RARE SQUAMOUS EPITHELIAL CELLS PRESENT NO ORGANISMS SEEN Performed at [facility]    Culture   Final    MODERATE STAPHYLOCOCCUS AUREUS Note: RIFAMPIN AND GENTAMICIN SHOULD NOT BE USED AS SINGLE DRUGS FOR TREATMENT OF STAPH INFECTIONS. Performed at [facility]    Report Status 01/15/2015 FINAL  Final   Organism ID, Bacteria STAPHYLOCOCCUS AUREUS  Final      Susceptibility   Staphylococcus aureus - MIC*    CLINDAMYCIN <=0.25 SENSITIVE Sensitive     ERYTHROMYCIN 0.5 SENSITIVE Sensitive     GENTAMICIN <=0.5 SENSITIVE Sensitive     LEVOFLOXACIN <=0.12 SENSITIVE Sensitive     OXACILLIN 0.5 SENSITIVE Sensitive     PENICILLIN >=0.5 RESISTANT Resistant     RIFAMPIN <=0.5 SENSITIVE Sensitive     TRIMETH/SULFA <=10 SENSITIVE Sensitive     VANCOMYCIN 1 SENSITIVE Sensitive     TETRACYCLINE <=1 SENSITIVE Sensitive     MOXIFLOXACIN <=0.25 SENSITIVE Sensitive     * MODERATE STAPHYLOCOCCUS AUREUS    Anti-infectives    Start     Dose/Rate Route Frequency Ordered Stop   01/13/15 1000  linezolid (ZYVOX) IVPB 600 mg  Status:  Discontinued     600 mg 300 mL/hr over 60 Minutes Intravenous Every 12 hours 01/13/15 0842 01/14/15 1313   01/13/15 1000  ciprofloxacin (CIPRO) IVPB 400 mg  Status:  Discontinued     400 mg 200 mL/hr over 60 Minutes Intravenous Every 12 hours 01/13/15 0859 01/15/15 1049   01/11/15 1700  cefTAZidime (FORTAZ) 2 g in dextrose 5 % 50 mL IVPB     2 g 100 mL/hr over 30 Minutes Intravenous Every 12 hours 01/11/15 1612     01/11/15 1700  vancomycin (VANCOCIN) IVPB 750 mg/150 ml premix  Status:  Discontinued     750 mg 150 mL/hr over 60 Minutes Intravenous Every 12 hours 01/11/15 1612 01/13/15 0842      Assessment: 48 yoM continues on D#4 abx, now narrowed to monotherapy Ceftazidime, for MSSA PNA. Ceftazidime doesn't have the greatest staph coverage. Spoke w/ Dr. Sung Amabile, ok to switch to  Ceftriaxone. WBC 10.5, afebrile.   6/8 Ceftaz>>6/12 6/8 Vanc>>6/10 6/10 Linezolid>>6/12 6/10 Cipro>>6/11 CTX 6/12>> Goal of Therapy:  Eradiction of infection  Plan:  Discontinue ceftaz 2g IV q12h Begin CTX 2g q24h Monitor clinical progress and renal function F/u LOT  Thank you for allowing pharmacy to be part of this patient's care team  Lio Wehrly M. Keyontay Stolz, Pharm.D Clinical Pharmacy Resident Pager: [phone] 01/15/2015 .1:57 PM

## 2015-01-16 ENCOUNTER — Inpatient Hospital Stay (HOSPITAL_COMMUNITY): Payer: Medicaid Other

## 2015-01-16 LAB — POCT I-STAT 3, ART BLOOD GAS (G3+)
ACID-BASE DEFICIT: 1 mmol/L (ref 0.0–2.0)
Acid-base deficit: 2 mmol/L (ref 0.0–2.0)
Bicarbonate: 24 mEq/L (ref 20.0–24.0)
Bicarbonate: 26.6 mEq/L — ABNORMAL HIGH (ref 20.0–24.0)
O2 SAT: 80 %
O2 Saturation: 93 %
PO2 ART: 80 mmHg (ref 80.0–100.0)
Patient temperature: 36.9
TCO2: 25 mmol/L (ref 0–100)
TCO2: 28 mmol/L (ref 0–100)
pCO2 arterial: 44 mmHg (ref 35.0–45.0)
pCO2 arterial: 63.2 mmHg (ref 35.0–45.0)
pH, Arterial: 7.345 — ABNORMAL LOW (ref 7.350–7.450)
pH, Arterial: 7.231 — ABNORMAL LOW (ref 7.350–7.450)
pO2, Arterial: 46 mmHg — ABNORMAL LOW (ref 80.0–100.0)

## 2015-01-16 LAB — CBC
HCT: 25.7 % — ABNORMAL LOW (ref 39.0–52.0)
Hemoglobin: 8.4 g/dL — ABNORMAL LOW (ref 13.0–17.0)
MCH: 28 pg (ref 26.0–34.0)
MCHC: 32.7 g/dL (ref 30.0–36.0)
MCV: 85.7 fL (ref 78.0–100.0)
Platelets: 130 10*3/uL — ABNORMAL LOW (ref 150–400)
RBC: 3 MIL/uL — ABNORMAL LOW (ref 4.22–5.81)
RDW: 16.8 % — ABNORMAL HIGH (ref 11.5–15.5)
WBC: 13.1 10*3/uL — ABNORMAL HIGH (ref 4.0–10.5)

## 2015-01-16 LAB — GLUCOSE, CAPILLARY
GLUCOSE-CAPILLARY: 124 mg/dL — AB (ref 65–99)
GLUCOSE-CAPILLARY: 109 mg/dL — AB (ref 65–99)
Glucose-Capillary: 109 mg/dL — ABNORMAL HIGH (ref 65–99)
Glucose-Capillary: 99 mg/dL (ref 65–99)
Glucose-Capillary: 123 mg/dL — ABNORMAL HIGH (ref 65–99)
Glucose-Capillary: 125 mg/dL — ABNORMAL HIGH (ref 65–99)
Glucose-Capillary: 95 mg/dL (ref 65–99)
Glucose-Capillary: 98 mg/dL (ref 65–99)

## 2015-01-16 LAB — COMPREHENSIVE METABOLIC PANEL
ALT: 144 U/L — ABNORMAL HIGH (ref 17–63)
ANION GAP: 8 (ref 5–15)
AST: 75 U/L — ABNORMAL HIGH (ref 15–41)
Albumin: 2.1 g/dL — ABNORMAL LOW (ref 3.5–5.0)
Alkaline Phosphatase: 89 U/L (ref 38–126)
BILIRUBIN TOTAL: 3.2 mg/dL — AB (ref 0.3–1.2)
BUN: 54 mg/dL — AB (ref 6–20)
CHLORIDE: 116 mmol/L — AB (ref 101–111)
CO2: 26 mmol/L (ref 22–32)
CREATININE: 1.43 mg/dL — AB (ref 0.61–1.24)
Calcium: 8.1 mg/dL — ABNORMAL LOW (ref 8.9–10.3)
GFR calc Af Amer: >60 mL/min (ref >60)
GFR calc non Af Amer: 57 mL/min — ABNORMAL LOW (ref >60)
Glucose, Bld: 141 mg/dL — ABNORMAL HIGH (ref 65–99)
Potassium: 3.7 mmol/L (ref 3.5–5.1)
Sodium: 150 mmol/L — ABNORMAL HIGH (ref 135–145)
Total Protein: 5.5 g/dL — ABNORMAL LOW (ref 6.5–8.1)

## 2015-01-16 LAB — BLOOD GAS, ARTERIAL
ACID-BASE DEFICIT: 1.5 mmol/L (ref 0.0–2.0)
Bicarbonate: 25.3 mEq/L — ABNORMAL HIGH (ref 20.0–24.0)
DRAWN BY: 28340
FIO2: 70 %
LHR: 35 {breaths}/min
O2 SAT: 94.5 %
PATIENT TEMPERATURE: 98.6
PEEP: 12 cmH2O
TCO2: 27.3 mmol/L (ref 0–100)
VT: 400 mL
pCO2 arterial: 64.7 mmHg (ref 35.0–45.0)
pH, Arterial: 7.217 — ABNORMAL LOW (ref 7.350–7.450)
pO2, Arterial: 95.1 mmHg (ref 80.0–100.0)

## 2015-01-16 LAB — TRIGLYCERIDES: TRIGLYCERIDES: 210 mg/dL — AB (ref <150)

## 2015-01-16 MED ORDER — FREE WATER
300.0000 mL | Freq: Four times a day (QID) | Status: DC
  Administered 2015-01-16 – 2015-01-19 (×11): 300 mL

## 2015-01-16 MED ORDER — ARTIFICIAL TEARS OP OINT
1.0000 "application " | TOPICAL_OINTMENT | Freq: Three times a day (TID) | OPHTHALMIC | Status: DC
  Administered 2015-01-16 – 2015-01-17 (×4): 1 via OPHTHALMIC
  Filled 2015-01-16: qty 3.5

## 2015-01-16 MED ORDER — PROPOFOL 1000 MG/100ML IV EMUL
INTRAVENOUS | Status: AC
  Filled 2015-01-16: qty 100

## 2015-01-16 MED ORDER — FENTANYL CITRATE (PF) 100 MCG/2ML IJ SOLN: 100.0000 ug | Freq: Once | INTRAMUSCULAR | Status: AC | PRN

## 2015-01-16 MED ORDER — SODIUM CHLORIDE 0.9 % IV SOLN: INTRAVENOUS | Status: DC | PRN

## 2015-01-16 MED ORDER — SODIUM CHLORIDE 0.9 % IV SOLN
3.0000 ug/kg/min | INTRAVENOUS | Status: DC
  Administered 2015-01-16 (×2): 3 ug/kg/min via INTRAVENOUS
  Administered 2015-01-17: 4 ug/kg/min via INTRAVENOUS
  Filled 2015-01-16 (×4): qty 20

## 2015-01-16 MED ORDER — FENTANYL CITRATE (PF) 100 MCG/2ML IJ SOLN: 100.0000 ug | Freq: Once | INTRAMUSCULAR | Status: AC

## 2015-01-16 MED ORDER — FENTANYL BOLUS VIA INFUSION
50.0000 ug | INTRAVENOUS | Status: DC | PRN
  Administered 2015-01-16 – 2015-01-19 (×7): 50 ug via INTRAVENOUS
  Filled 2015-01-16: qty 50

## 2015-01-16 MED ORDER — FUROSEMIDE 10 MG/ML IJ SOLN
INTRAMUSCULAR | Status: AC
  Administered 2015-01-16: 40 mg
  Filled 2015-01-16: qty 4

## 2015-01-16 MED ORDER — PROPOFOL 1000 MG/100ML IV EMUL
5.0000 ug/kg/min | INTRAVENOUS | Status: DC
  Administered 2015-01-16: 35 ug/kg/min via INTRAVENOUS
  Administered 2015-01-16 (×2): 25 ug/kg/min via INTRAVENOUS
  Administered 2015-01-17: 10 ug/kg/min via INTRAVENOUS
  Administered 2015-01-17 – 2015-01-18 (×4): 30 ug/kg/min via INTRAVENOUS
  Administered 2015-01-18: 20 ug/kg/min via INTRAVENOUS
  Administered 2015-01-18: 30 ug/kg/min via INTRAVENOUS
  Filled 2015-01-16 (×13): qty 100

## 2015-01-16 MED ORDER — MIDAZOLAM HCL 2 MG/2ML IJ SOLN
2.0000 mg | INTRAMUSCULAR | Status: DC | PRN
  Administered 2015-01-16: 2 mg via INTRAVENOUS
  Filled 2015-01-16 (×2): qty 2

## 2015-01-16 MED ORDER — SODIUM CHLORIDE 0.9 % IV SOLN
25.0000 ug/h | INTRAVENOUS | Status: DC
  Administered 2015-01-16: 250 ug/h via INTRAVENOUS
  Administered 2015-01-16: 300 ug/h via INTRAVENOUS
  Administered 2015-01-17: 100 ug/h via INTRAVENOUS
  Administered 2015-01-18: 300 ug/h via INTRAVENOUS
  Administered 2015-01-19: 175 ug/h via INTRAVENOUS
  Filled 2015-01-16 (×8): qty 50

## 2015-01-16 MED ORDER — FUROSEMIDE 10 MG/ML IJ SOLN
40.0000 mg | Freq: Three times a day (TID) | INTRAMUSCULAR | Status: AC
  Administered 2015-01-16: 40 mg via INTRAVENOUS
  Filled 2015-01-16: qty 4

## 2015-01-16 MED ORDER — PROPOFOL 1000 MG/100ML IV EMUL
0.0000 ug/kg/min | INTRAVENOUS | Status: DC
  Administered 2015-01-16: 5 ug/kg/min via INTRAVENOUS

## 2015-01-16 MED ORDER — CISATRACURIUM BOLUS VIA INFUSION
0.0500 mg/kg | Freq: Once | INTRAVENOUS | Status: AC
  Administered 2015-01-16: 4.7 mg via INTRAVENOUS
  Filled 2015-01-16: qty 5

## 2015-01-16 NOTE — Progress Notes (Signed)
Called Elink MD. Pt having increased work of breathing, more frequent vent alarms, increased heart rate. Maxed out on pain meds and sedation. MD to provide PRN versed. Will continue to monitor.

## 2015-01-16 NOTE — Progress Notes (Signed)
PULMONARY / CRITICAL CARE MEDICINE   Name: Alexander Berry MRN: [id number] DOB: [birth year]    ADMISSION DATE:  01/08/2015 CONSULTATION DATE:  01/08/2015    PT PROFILE:   VF arrest while playing soccer 6/05. Prolonged ACLS. DES to RCA. Hypothermia protocol  MAJOR EVENTS/TEST RESULTS: 6/05 Admitted after OOH arrest 6/05 LHC: Dist RCA lesion, 80% stenosed. Mid RCA lesion, 100% stenosed. There is a 0% residual stenosis post intervention. A drug-eluting stent was placed. 2nd RPLB-1 lesion, 100% stenosed. 2nd RPLB-2 lesion, 100% stenosed. There is a 0% residual stenosis post intervention. RPDA lesion, 80% stenosed. There is a 0% residual stenosis post intervention.  6/06 EEG: Abnormal EEG due to the presence of a generalized slowing indicating a mild to moderate cerebral disturbance (encephalopathy). No epileptiform activity noted 6/06 TTE: The estimated ejection fraction was in the range of 55% to 60%. Mild posterior wall hypokinesis - decreased Definity microsphere uptake in the posterior wall suggests hypoperfusion  6/07 Rewarmed. Continuous sedatives stopped. RASS -5. No spont movement 6/07 CT head: NAD 6/07 repeat EEG: Abnormal EEG due to the presence of a generalized slowing indicating a mild to moderate cerebral disturbance (encephalopathy). No epileptiform activity noted 6/08 Increased agitation requiring sedation. Not F/C. Increased O2 reqts. Frothy pink sputum. Resp culture obatined. PCT elevated. Empiric abx initiated. CXR c/w increased pulm edema. Hypertensive. Furosemide ordered 6/08 repeat limited TTE: LVEF 50%. Inferior wall AK 6/09 Intermittently F/C. RASS -2 to +1. Requiring 70% O2. Dexmedetomidine initiated 6/09 PM: self extubated, required immediate re-intubation. High fevers - cooling blanket initiated. Hypotension - NE initiated 6/10 High fevers persist. Arctic sun device re-ordered to maintain T < 99.5. PCT remained elevated. Abx adjusted. Cr rising. New dx of severe  sepsis/septic shock rendered. Elevated bilirubin  6/10 RUQ [place]: biliary sludge. No obstruction 6/11 Off vasopressors. Fevers controlled. Cr improving. No WUA due to increasing agitation 6/12 ARDS persists. AKI improving. Worsening hypernatremia - D5W initiated  INDWELLING DEVICES: Femoral sheath 6/05 >> 6/07 ETT 6/05 >>  R IJ CVL 6/05 >>    MICRO DATA: Resp 6/08 >> MSSA resp 6/10 >> MSSA  Blood 6/10 >>   PCT 6/08: 7.46           6/09: 8.90          6/10: 9.80          6/12: 5.15  ANTIMICROBIALS:  Vanc 6/08 >> 6/20 Linezolid 6/10 >> 6/11  Ciprofloxacin 6/10 >> 6/12 Ceftazidime 6/08 >> 6/12 Ceftriaxone 6/12 >>    SUBJ: Severe asynchrony overnight, required nimbex.   VITAL SIGNS: Temp:  [98.1 F (36.7 C)-99.1 F (37.3 C)] 98.1 F (36.7 C) (06/13 0814) Pulse Rate:  [78-95] 86 (06/13 1026) Resp:  [17-35] 35 (06/13 1100) BP: (104-177)/(53-101) 113/57 mmHg (06/13 1100) SpO2:  [90 %-100 %] 100 % (06/13 1100) FiO2 (%):  [50 %-70 %] 70 % (06/13 1026) HEMODYNAMICS:   VENTILATOR SETTINGS: Vent Mode:  [-] PRVC FiO2 (%):  [50 %-70 %] 70 % Set Rate:  [14 bmp-35 bmp] 35 bmp Vt Set:  [400 mL-600 mL] 470 mL PEEP:  [10 cmH20-14 cmH20] 12 cmH20 Plateau Pressure:  [21 cmH20-32 cmH20] 29 cmH20 INTAKE / OUTPUT:  Intake/Output Summary (Last 24 hours) at 01/16/15 1138 Last data filed at 01/16/15 1100  Gross per 24 hour  Intake 5678.16 ml  Output   3875 ml  Net 1803.16 ml    PHYSICAL EXAMINATION: General: RASS -2, + F/C Neuro: Sedated and paralyzed HEENT: NCAT, WNL Cardiovascular:  Reg, no  M Lungs: coarse throughout, no wheezes Abdomen: soft, diminished BS Ext: warm, no LE edema  LABS:  PULMONARY  Recent Labs Lab 01/09/15 1229 01/09/15 1611 01/16/15 0259 01/16/15 0442 01/16/15 0612  PHART  --   --  7.345* 7.231* 7.217*  PCO2ART  --   --  44.0 63.2* 64.7*  PO2ART  --   --  46.0* 80.0 95.1  HCO3  --   --  24.0 26.6* 25.3*  TCO2 27.3  O2SAT  --    --  80.0 93.0 94.5    CBC  Recent Labs Lab 01/14/15 0441 01/15/15 0500 01/16/15 0228  HGB 8.0* 8.6* 8.4*  HCT 24.2* 26.0* 25.7*  WBC 8.0 10.5 13.1*  PLT 125* 136* 130*    COAGULATION No results for input(s): INR in the last 168 hours.  CARDIAC    Recent Labs Lab 01/09/15 1200 01/11/15 0435  TROPONINI >65.00* 57.38*   No results for input(s): PROBNP in the last 168 hours.   CHEMISTRY  Recent Labs Lab 01/12/15 0450 01/13/15 0415 01/14/15 0441 01/15/15 0500 01/16/15 0228  NA 140 146* 147* 150* 150*  K 4.2 3.9 3.7 3.4* 3.7  CL 107 109 111 114* 116*  CO2 GLUCOSE 140* 121* 136* 117* 141*  BUN 42* 64* 78* 58* 54*  CREATININE 2.00* 2.98* 2.43* 1.58* 1.43*  CALCIUM 8.2* 8.1* 8.0* 8.3* 8.1*   Estimated Creatinine Clearance: 69.5 mL/min (by C-G formula based on Cr of 1.43).   LIVER  Recent Labs Lab 01/13/15 0415 01/13/15 1100 01/14/15 0441 01/15/15 0500 01/16/15 0228  AST 137* 126* 121* 84* 75*  ALT 255* 223* 216* 173* 144*  ALKPHOS 47 47 57 69 89  BILITOT 2.4* 2.4* 2.9* 3.0* 3.2*  PROT 5.3* 5.1* 5.5* 5.4* 5.5*  ALBUMIN 2.3* 2.1* 2.2* 2.2* 2.1*     INFECTIOUS  Recent Labs Lab 01/12/15 0450 01/13/15 0415 01/15/15 0500  PROCALCITON 8.90 9.80 5.15     ENDOCRINE CBG (last 3)   Recent Labs  01/15/15 2352 01/16/15 0454 01/16/15 0811  GLUCAP 109* 124* 123*     CXR: NSC ARDS pattern   ASSESSMENT / PLAN: CARDIOVASCULAR A:  Acute STEMI 6/05 Out of hospital VF arrest 6/05 Cardiogenic shock, resolved Hypertension, controlled Mild sinus tachycardia Septic shock 6/09, resolved 6/11 P:  Cards following MAP goal 65-80 mmHg Metoprolol initiated 6/12  PULMONARY A:  VDRF post arrest 6/05 Pulm edema 6/06 Staph PNA 6/08 ARDS 6/09 P:   Cont vent support - settings reviewed and/or adjusted Cont vent bundle Hold SBT while paralyzed. Will attempt to d/c nimbex today. Daily CXR for now Lasix x2 doses  RENAL A:   AKI, nonoliguric 6/05 ? CKD (Cr 2.15 on admission) Hypokalemia, recurrent Hypernatremia - worsening  Suspect post ATN diuresis P:   Monitor BMET intermittently Monitor I/Os Correct electrolytes as indicated KVO IVF Lasix x2 doses Increase free water to 300 ml q6 hours  GASTROINTESTINAL A:   Shock liver - improving transaminases Hyperbilirubinemia Biliary sludge by RUQ [place] 6/10 P:   SUP: enteral famotidine Cont TFs Monitor LFTs intermittently  HEMATOLOGIC A:   Anemia without overt blood loss Mild thrombocytopenia P:  DVT px: Brilinta, SCDs Monitor CBC intermittently Transfuse per usual guidelines  INFECTIOUS A:   Elevated PCT MSSA PNA High fever P:   Monitor temp, WBC count Micro and abx as above Cont [employer] pads to control temp  Avoid acetaminophen due to elevated LFTs  ENDOCRINE  A:   Hyperglycemia without prior dx of DM P:   Cont SSI  NEUROLOGIC A:   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   Critical Care Time devoted to patient care services described in this note is  35  Minutes. This time reflects time of care of this signee Dr Koren Bound. This critical care time does not reflect procedure time, or teaching time or supervisory time of PA/NP/Med student/Med Resident etc but could involve care discussion time.  Alyson Reedy, M.D. [facility] Pulmonary/Critical Care Medicine. Pager: [phone]. After hours pager: [phone].

## 2015-01-16 NOTE — Progress Notes (Signed)
eLink Physician-Brief Progress Note Patient Name: Alexander Berry DOB: 27-Sep-1965 MRN: [id number]   Date of Service  01/16/2015  HPI/Events of Note  ARDS and dys-synchrony with vent.  eICU Interventions  Add nimbex.     Intervention Category Major Interventions: Other:  Kaven Cumbie 01/16/2015, 2:27 AM

## 2015-01-16 NOTE — Progress Notes (Signed)
eLink Physician-Brief Progress Note Patient Name: Alexander Berry DOB: Sep 07, 1965 MRN: [id number]   Date of Service  01/16/2015  HPI/Events of Note  Pt with MSSA PNA and ARDS.  Difficulty with oxygenation and increased WOB.   eICU Interventions  Will change to ARDS protocol.  Will change sedation to diprivan, fentanyl, and prn versed. Repeat CXR now and ABG 1 hour after changing to ARDS protocol.      Intervention Category Major Interventions: Other:  [name] 01/16/2015, 1:00 AM

## 2015-01-16 NOTE — Progress Notes (Addendum)
Subjective:  Intubated/sedated on vent  Objective:   Vital Signs : Filed Vitals:   01/16/15 0500 01/16/15 0531 01/16/15 0534 01/16/15 0600  BP: 135/71   139/70  Pulse:      Temp:      TempSrc:      Resp: 35 35 35 35  Height:      Weight:      SpO2: 99% 100% 100% 100%    Intake/Output from previous day:  Intake/Output Summary (Last 24 hours) at 01/16/15 0753 Last data filed at 01/16/15 0600  Gross per 24 hour  Intake 5017.34 ml  Output   3900 ml  Net 1117.34 ml    I/O since admission: +329  Wt Readings from Last 3 Encounters:  01/14/15 93.21 kg (205 lb 7.9 oz)    Medications: . antiseptic oral rinse  7 mL Mouth Rinse QID  . artificial tears  1 application Both Eyes 3 times per day  . aspirin  81 mg Per Tube Daily  . cefTAZidime (FORTAZ)  IV  2 g Intravenous Q12H  . cefTRIAXone (ROCEPHIN)  IV  2 g Intravenous Q24H  . chlorhexidine  15 mL Mouth Rinse BID  . famotidine  20 mg Per Tube Daily  . feeding supplement (PRO-STAT SUGAR FREE 64)  30 mL Per Tube TID  . free water  200 mL Per Tube 3 times per day  . insulin aspart  0-15 Units Subcutaneous 6 times per day  . metoprolol tartrate  25 mg Per Tube BID  . ticagrelor  90 mg Per Tube BID    . sodium chloride Stopped (01/15/15 1157)  . cisatracurium (NIMBEX) infusion 4 mcg/kg/min (01/16/15 0636)  . dextrose 5 % with kcl 100 mL/hr at 01/15/15 2255  . feeding supplement (VITAL HIGH PROTEIN) 1,000 mL (01/16/15 0340)  . fentaNYL infusion INTRAVENOUS 300 mcg/hr (01/16/15 0553)  . propofol (DIPRIVAN) infusion 35 mcg/kg/min (01/16/15 0553)    Physical Exam:   General appearance: sedated on vent Neck: no adenopathy, no carotid bruit, no JVD, supple, symmetrical, trachea midline and thyroid not enlarged, symmetric, no tenderness/mass/nodules Lungs: no wheezing Heart: regular rate and rhythm and no s3 Abdomen: soft, non-tender; BS + but diminished; no masses,  no organomegaly Extremities: no edema, redness or  tenderness in the calves or thighs Skin: Skin color, texture, turgor normal. No rashes or lesions Neurologic: no focal defects   Rate: 96  Rhythm: normal sinus rhythm  ECG (independently read by me): from 01/11/15:  ST at 102 with evolving STT changes inferolaterally from MI but with preserved inferior R waves suggesting myocardial salvage.  Will re-check ECG today  Lab Results:   Recent Labs  01/14/15 0441 01/15/15 0500 01/16/15 0228  NA 147* 150* 150*  K 3.7 3.4* 3.7  CL 111 114* 116*  CO2 26 25 26   GLUCOSE 136* 117* 141*  BUN 78* 58* 54*  CREATININE 2.43* 1.58* 1.43*  CALCIUM 8.0* 8.3* 8.1*     Recent Labs  01/14/15 0441 01/15/15 0500 01/16/15 0228  WBC 8.0 10.5 13.1*  HGB 8.0* 8.6* 8.4*  HCT 24.2* 26.0* 25.7*  MCV 84.3 84.1 85.7  PLT 125* 136* 130*    No results for input(s): TROPONINI in the last 72 hours.  Invalid input(s): CK, MB  No results found for: TSH   Recent Labs  01/13/15 1100 01/14/15 0441 01/15/15 0500 01/16/15 0228  PROT 5.1* 5.5* 5.4* 5.5*  ALBUMIN 2.1* 2.2* 2.2* 2.1*  AST 126* 121* 84* 75*  ALT  223* 216* 173* 144*  ALKPHOS 47 57 69 89  BILITOT 2.4* 2.9* 3.0* 3.2*  BILIDIR 0.7* 0.9*  --   --   IBILI 1.7* 2.0*  --   --    No results for input(s): INR in the last 72 hours. BNP (last 3 results) No results for input(s): BNP in the last 8760 hours.  ProBNP (last 3 results) No results for input(s): PROBNP in the last 8760 hours.   Lipid Panel     Component Value Date/Time   TRIG 210* 01/16/2015 0228       No results for input(s): HGBA1C in the last 72 hours.   Imaging:  Dg Chest Port 1 View  01/16/2015   CLINICAL DATA:  Respiratory distress syndrome  EXAM: PORTABLE CHEST - 1 VIEW  COMPARISON:  01/15/2015  FINDINGS: Endotracheal tube with tip measuring 3 cm above the carina. Enteric tube tip is off the field of view but below the left hemidiaphragm. Right central venous catheter with tip over the the upper SVC region  laterally. No change in position of appliances. Diffuse bilateral airspace disease in the lungs suggesting ARDS or edema. Heart size is normal. No blunting of costophrenic angles. No pneumothorax. Calcification of the aorta.  IMPRESSION: Appliances appear in satisfactory position without significant change. Diffuse bilateral pulmonary infiltrates.   Electronically Signed   By: Burman Nieves M.D.   On: 01/16/2015 02:54   Dg Chest Port 1 View  01/15/2015   CLINICAL DATA:  Respiratory failure.  EXAM: PORTABLE CHEST - 1 VIEW  COMPARISON:  1 day prior  FINDINGS: Endotracheal tube terminates 2.7 cm above carina. Right internal jugular line unchanged at mid SVC. Nasogastric tube extends beyond the inferior aspect of the film. Cardiomegaly accentuated by AP portable technique. No pleural effusion or pneumothorax. Patchy airspace opacities persist bilaterally. Not significantly changed.  IMPRESSION: No change in patchy bilateral airspace disease, suspicious for infection versus developing ARDS.   Electronically Signed   By: Jeronimo Greaves M.D.   On: 01/15/2015 09:00      Assessment/Plan:   Active Problems:   Cardiac arrest   Acute ST elevation myocardial infarction (STEMI) involving right coronary artery   Acute respiratory failure with hypoxia   Hypokalemia   Acute pulmonary edema   Hyperglycemia   Encephalopathy acute   Pneumonia, organism unspecified   Encounter for intubation   Septic shock   ARDS (adult respiratory distress syndrome)   Thrombocytopenia   Staphylococcal pneumonia   AKI (acute kidney injury)  1. Out of hospital VF cardiac arrest/Inferior STEMI s/p emergent PCI by me and PCI/DES to mid RCA occlusion with  very large dominant RCA with extensive thrombus burden and distal embolization with PTCA and opening of 2 distal branches. Myocardial salvage suggested by ECG and echo.  EF 50% by echo 2. Respiratory failure; re-intubated  3. Sepsis/ARDS/Staph pneumonia: WBC 13.1 today on   fortaz and rocephin  4. Increase LFT: prob secondary to shock liver; improving 5.Renal insufficiency: improving with Cr 1.43 today 6. Anemia  On ASA/Brilinta for DAPT;  Continue metoprolol; stable BP and rhythm; no further VT off amiodarone  Lennette Bihari, MD, [facility] 01/16/2015, 7:53 AM

## 2015-01-16 NOTE — Procedures (Signed)
Arterial Catheter Insertion Procedure Note Alexander Berry [id number] 09-Apr-1966  Procedure: Insertion of Arterial Catheter  Indications: Frequent blood sampling  Procedure Details Consent: Unable to obtain consent because of altered level of consciousness. Time Out: Verified patient identification, verified procedure, site/side was marked, verified correct patient position, special equipment/implants available, medications/allergies/relevent history reviewed, required imaging and test results available.  Performed  Maximum sterile technique was used including antiseptics, gloves, hand hygiene, mask and sheet. Skin prep: Chlorhexidine; local anesthetic administered 20 gauge catheter was inserted into right radial artery using the Seldinger technique.  Evaluation Blood flow good; BP tracing good. Complications: No apparent complications.   Augustine Radar 01/16/2015

## 2015-01-17 ENCOUNTER — Inpatient Hospital Stay (HOSPITAL_COMMUNITY): Payer: Medicaid Other

## 2015-01-17 LAB — BASIC METABOLIC PANEL
Anion gap: 9 (ref 5–15)
BUN: 54 mg/dL — AB (ref 6–20)
CO2: 28 mmol/L (ref 22–32)
CREATININE: 1.56 mg/dL — AB (ref 0.61–1.24)
Calcium: 8.3 mg/dL — ABNORMAL LOW (ref 8.9–10.3)
Chloride: 114 mmol/L — ABNORMAL HIGH (ref 101–111)
GFR calc non Af Amer: 51 mL/min — ABNORMAL LOW (ref >60)
GFR, EST AFRICAN AMERICAN: 59 mL/min — AB (ref >60)
Glucose, Bld: 104 mg/dL — ABNORMAL HIGH (ref 65–99)
POTASSIUM: 3.6 mmol/L (ref 3.5–5.1)
Sodium: 151 mmol/L — ABNORMAL HIGH (ref 135–145)

## 2015-01-17 LAB — CBC
HCT: 24.2 % — ABNORMAL LOW (ref 39.0–52.0)
HEMOGLOBIN: 7.7 g/dL — AB (ref 13.0–17.0)
MCH: 28 pg (ref 26.0–34.0)
MCHC: 31.8 g/dL (ref 30.0–36.0)
MCV: 88 fL (ref 78.0–100.0)
Platelets: 115 10*3/uL — ABNORMAL LOW (ref 150–400)
RBC: 2.75 MIL/uL — AB (ref 4.22–5.81)
RDW: 17.4 % — ABNORMAL HIGH (ref 11.5–15.5)
WBC: 12.1 10*3/uL — ABNORMAL HIGH (ref 4.0–10.5)

## 2015-01-17 LAB — BLOOD GAS, ARTERIAL
ACID-BASE EXCESS: 2.8 mmol/L — AB (ref 0.0–2.0)
Bicarbonate: 26.7 mEq/L — ABNORMAL HIGH (ref 20.0–24.0)
Drawn by: 426241
FIO2: 0.5 %
O2 Saturation: 98.8 %
PATIENT TEMPERATURE: 98.6
PCO2 ART: 40.5 mmHg (ref 35.0–45.0)
PEEP: 8 cmH2O
RATE: 35 resp/min
TCO2: 28 mmol/L (ref 0–100)
VT: 470 mL
pH, Arterial: 7.435 (ref 7.350–7.450)
pO2, Arterial: 124 mmHg — ABNORMAL HIGH (ref 80.0–100.0)

## 2015-01-17 LAB — PHOSPHORUS: Phosphorus: 3.5 mg/dL (ref 2.5–4.6)

## 2015-01-17 LAB — GLUCOSE, CAPILLARY
GLUCOSE-CAPILLARY: 109 mg/dL — AB (ref 65–99)
GLUCOSE-CAPILLARY: 133 mg/dL — AB (ref 65–99)
GLUCOSE-CAPILLARY: 101 mg/dL — AB (ref 65–99)
Glucose-Capillary: 96 mg/dL (ref 65–99)
Glucose-Capillary: 124 mg/dL — ABNORMAL HIGH (ref 65–99)

## 2015-01-17 LAB — MAGNESIUM: Magnesium: 2.4 mg/dL (ref 1.7–2.4)

## 2015-01-17 MED ORDER — POTASSIUM CHLORIDE 10 MEQ/50ML IV SOLN
10.0000 meq | INTRAVENOUS | Status: AC
  Administered 2015-01-17 (×2): 10 meq via INTRAVENOUS
  Filled 2015-01-17 (×2): qty 50

## 2015-01-17 MED ORDER — POTASSIUM CHLORIDE 20 MEQ/15ML (10%) PO SOLN
40.0000 meq | Freq: Three times a day (TID) | ORAL | Status: AC
  Administered 2015-01-17: 40 meq
  Filled 2015-01-17 (×3): qty 30

## 2015-01-17 MED ORDER — METOPROLOL TARTRATE 25 MG/10 ML ORAL SUSPENSION
25.0000 mg | Freq: Three times a day (TID) | ORAL | Status: DC
  Administered 2015-01-17 – 2015-01-18 (×4): 25 mg
  Filled 2015-01-17 (×7): qty 10

## 2015-01-17 MED ORDER — DEXTROSE 5 % IV SOLN
INTRAVENOUS | Status: DC
  Administered 2015-01-17 – 2015-01-19 (×2): via INTRAVENOUS

## 2015-01-17 MED ORDER — FUROSEMIDE 10 MG/ML IJ SOLN
40.0000 mg | Freq: Three times a day (TID) | INTRAMUSCULAR | Status: AC
  Administered 2015-01-17 (×2): 40 mg via INTRAVENOUS
  Filled 2015-01-17 (×2): qty 4

## 2015-01-17 NOTE — Progress Notes (Signed)
Called Elink MD. Pt's temperature is 38.5. Gave Tylenol per tube at 2030 and have applied ice packs. MD ordered cooling blanket and blood cultures. Will continue to monitor.

## 2015-01-17 NOTE — Progress Notes (Signed)
Subjective:  Intubated/sedated/ paralyzed on vent  Objective:   Vital Signs : Filed Vitals:   01/17/15 0300 01/17/15 0400 01/17/15 0500 01/17/15 0600  BP: 121/59 137/64 121/66 135/67  Pulse:      Temp:  98.2 F (36.8 C)    TempSrc:  Core (Comment)    Resp: 35 35 35 35  Height:      Weight:      SpO2: 100% 100% 100% 100%    Intake/Output from previous day:  Intake/Output Summary (Last 24 hours) at 01/17/15 0759 Last data filed at 01/17/15 0600  Gross per 24 hour  Intake 3653.67 ml  Output   4685 ml  Net -1031.33 ml    I/O since admission: -701  Wt Readings from Last 3 Encounters:  01/14/15 205 lb 7.9 oz (93.21 kg)    Medications: . antiseptic oral rinse  7 mL Mouth Rinse QID  . artificial tears  1 application Both Eyes 3 times per day  . aspirin  81 mg Per Tube Daily  . cefTRIAXone (ROCEPHIN)  IV  2 g Intravenous Q24H  . chlorhexidine  15 mL Mouth Rinse BID  . famotidine  20 mg Per Tube Daily  . feeding supplement (PRO-STAT SUGAR FREE 64)  30 mL Per Tube TID  . free water  300 mL Per Tube Q6H  . insulin aspart  0-15 Units Subcutaneous 6 times per day  . metoprolol tartrate  25 mg Per Tube BID  . ticagrelor  90 mg Per Tube BID    . sodium chloride 10 mL/hr at 01/16/15 2000  . cisatracurium (NIMBEX) infusion 4 mcg/kg/min (01/17/15 0228)  . feeding supplement (VITAL HIGH PROTEIN) Stopped (01/16/15 1000)  . fentaNYL infusion INTRAVENOUS 250 mcg/hr (01/16/15 2237)  . propofol (DIPRIVAN) infusion 35 mcg/kg/min (01/17/15 0400)    Physical Exam:   General appearance: sedated on vent Neck: no adenopathy, no carotid bruit, no JVD, supple, symmetrical, trachea midline and thyroid not enlarged, symmetric, no tenderness/mass/nodules Lungs: no wheezing Heart: regular rate and rhythm and no s3 Abdomen: soft, non-tender; BS + but diminished; no masses,  no organomegaly Extremities: no edema, redness or tenderness in the calves or thighs Skin: Skin color, texture,  turgor normal. No rashes or lesions Neurologic: no focal defects   Rate: 90  Rhythm: normal sinus rhythm  ECG (independently read by me): from 01/11/15:  ST at 102 with evolving STT changes inferolaterally from MI but with preserved inferior R waves suggesting myocardial salvage.  ECG pending from today  Lab Results:   Recent Labs  01/15/15 0500 01/16/15 0228 01/17/15 0435  NA 150* 150* 151*  K 3.4* 3.7 3.6  CL 114* 116* 114*  CO2 GLUCOSE 117* 141* 104*  BUN 58* 54* 54*  CREATININE 1.58* 1.43* 1.56*  CALCIUM 8.3* 8.1* 8.3*  MG  --   --  2.4  PHOS  --   --  3.5     Recent Labs  01/15/15 0500 01/16/15 0228 01/17/15 0435  WBC 10.5 13.1* 12.1*  HGB 8.6* 8.4* 7.7*  HCT 26.0* 25.7* 24.2*  MCV 84.1 85.7 88.0  PLT 136* 130* 115*    No results for input(s): TROPONINI in the last 72 hours.  Invalid input(s): CK, MB  No results found for: TSH   Recent Labs  01/15/15 0500 01/16/15 0228  PROT 5.4* 5.5*  ALBUMIN 2.2* 2.1*  AST 84* 75*  ALT 173* 144*  ALKPHOS 69 89  BILITOT 3.0* 3.2*  No results for input(s): INR in the last 72 hours. BNP (last 3 results) No results for input(s): BNP in the last 8760 hours.  ProBNP (last 3 results) No results for input(s): PROBNP in the last 8760 hours.   Lipid Panel     Component Value Date/Time   TRIG 210* 01/16/2015 0228       No results for input(s): HGBA1C in the last 72 hours.   Imaging:  Dg Chest Port 1 View  01/17/2015   CLINICAL DATA:  49 year old male status post cardiac arrest, respiratory failure with hypoxia. Intubated. Initial encounter.  EXAM: PORTABLE CHEST - 1 VIEW  COMPARISON:  01/16/2015 and earlier.  FINDINGS: Portable AP semi upright view at 0513 hours. Stable endotracheal tube tip just below the clavicles. Stable right IJ central line. Enteric tube in place, courses to the left abdomen with tip not included.  Stable lung volumes. Stable cardiac size and mediastinal contours. Widespread  coarse pulmonary opacity with mildly improved bilateral ventilation over this series of exams since 01/12/2015. No pneumothorax or pleural effusion.  IMPRESSION: 1.  Stable lines and tubes. 2. Widespread coarse pulmonary opacity with slowly improving bilateral ventilation since 01/12/2015. No new cardiopulmonary abnormality.   Electronically Signed   By: Odessa Fleming M.D.   On: 01/17/2015 07:32   Dg Chest Port 1 View  01/16/2015   CLINICAL DATA:  Respiratory distress syndrome  EXAM: PORTABLE CHEST - 1 VIEW  COMPARISON:  01/15/2015  FINDINGS: Endotracheal tube with tip measuring 3 cm above the carina. Enteric tube tip is off the field of view but below the left hemidiaphragm. Right central venous catheter with tip over the the upper SVC region laterally. No change in position of appliances. Diffuse bilateral airspace disease in the lungs suggesting ARDS or edema. Heart size is normal. No blunting of costophrenic angles. No pneumothorax. Calcification of the aorta.  IMPRESSION: Appliances appear in satisfactory position without significant change. Diffuse bilateral pulmonary infiltrates.   Electronically Signed   By: Burman Nieves M.D.   On: 01/16/2015 02:54      Assessment/Plan:   Active Problems:   Cardiac arrest   Acute ST elevation myocardial infarction (STEMI) involving right coronary artery   Acute respiratory failure with hypoxia   Hypokalemia   Acute pulmonary edema   Hyperglycemia   Encephalopathy acute   Pneumonia, organism unspecified   Encounter for intubation   Septic shock   ARDS (adult respiratory distress syndrome)   Thrombocytopenia   Staphylococcal pneumonia   AKI (acute kidney injury)  1. Day 9 s/p  Out of hospital VF cardiac arrest/Inferior STEMI s/p emergent PCI by me and PCI/DES to mid RCA occlusion with  very large dominant RCA with extensive thrombus burden and distal embolization with PTCA and opening of 2 distal branches. Myocardial salvage suggested by ECG and echo.   EF 50% by echo 2. Respiratory failure; re-intubated after self-extubation on 6/9 3. Sepsis/ARDS/Staph pneumonia: WBC slightly improved to 12.1 today on fortaz and rocephin  4. Increase LFT: prob secondary to shock liver; improving 5.Renal insufficiency:  Cr 1.56 today; GFR 59 6. Anemia: 7.7/24 7. Hypernatremia: Na 151 today with free water  On ASA/Brilinta for DAPT;  Some BP lability, P in 90's with sinus rhythm; no further VT off amiodarone. Will increase metoprolol to 25 mg every 8 hrs. Weaning protocol from vent per CCM. F/U ECG.  Lennette Bihari, MD, [facility] 01/17/2015, 7:59 AM

## 2015-01-17 NOTE — Progress Notes (Signed)
eLink Physician-Brief Progress Note Patient Name: [name] DOB: Dec 17, 1965 MRN: [id number]   Date of Service  01/17/2015  HPI/Events of Note  Temp = 101.3 F.   eICU Interventions  Will order: 1. Cooling Blanket. 2. Blood Cultures X 2.      Intervention Category Intermediate Interventions: Infection - evaluation and management  Sommer,Steven Eugene 01/17/2015, 9:27 PM

## 2015-01-17 NOTE — Progress Notes (Signed)
PULMONARY / CRITICAL CARE MEDICINE   Name: Alexander Berry MRN: [id number] DOB: 1966-07-01    ADMISSION DATE:  01/08/2015 CONSULTATION DATE:  01/08/2015    PT PROFILE:   VF arrest while playing soccer 6/05. Prolonged ACLS. DES to RCA. Hypothermia protocol  MAJOR EVENTS/TEST RESULTS: 6/05 Admitted after OOH arrest 6/05 LHC: Dist RCA lesion, 80% stenosed. Mid RCA lesion, 100% stenosed. There is a 0% residual stenosis post intervention. A drug-eluting stent was placed. 2nd RPLB-1 lesion, 100% stenosed. 2nd RPLB-2 lesion, 100% stenosed. There is a 0% residual stenosis post intervention. RPDA lesion, 80% stenosed. There is a 0% residual stenosis post intervention.  6/06 EEG: Abnormal EEG due to the presence of a generalized slowing indicating a mild to moderate cerebral disturbance (encephalopathy). No epileptiform activity noted 6/06 TTE: The estimated ejection fraction was in the range of 55% to 60%. Mild posterior wall hypokinesis - decreased Definity microsphere uptake in the posterior wall suggests hypoperfusion  6/07 Rewarmed. Continuous sedatives stopped. RASS -5. No spont movement 6/07 CT head: NAD 6/07 repeat EEG: Abnormal EEG due to the presence of a generalized slowing indicating a mild to moderate cerebral disturbance (encephalopathy). No epileptiform activity noted 6/08 Increased agitation requiring sedation. Not F/C. Increased O2 reqts. Frothy pink sputum. Resp culture obatined. PCT elevated. Empiric abx initiated. CXR c/w increased pulm edema. Hypertensive. Furosemide ordered 6/08 repeat limited TTE: LVEF 50%. Inferior wall AK 6/09 Intermittently F/C. RASS -2 to +1. Requiring 70% O2. Dexmedetomidine initiated 6/09 PM: self extubated, required immediate re-intubation. High fevers - cooling blanket initiated. Hypotension - NE initiated 6/10 High fevers persist. Arctic sun device re-ordered to maintain T < 99.5. PCT remained elevated. Abx adjusted. Cr rising. New dx of severe  sepsis/septic shock rendered. Elevated bilirubin  6/10 RUQ [place]: biliary sludge. No obstruction 6/11 Off vasopressors. Fevers controlled. Cr improving. No WUA due to increasing agitation 6/12 ARDS persists. AKI improving. Worsening hypernatremia - D5W initiated  INDWELLING DEVICES: Femoral sheath 6/05 >> 6/07 ETT 6/05 >>  R IJ CVL 6/05 >>    MICRO DATA: Resp 6/08 >> MSSA resp 6/10 >> MSSA  Blood 6/10 >>   PCT 6/08: 7.46           6/09: 8.90          6/10: 9.80          6/12: 5.15  ANTIMICROBIALS:  Vanc 6/08 >> 6/20 Linezolid 6/10 >> 6/11  Ciprofloxacin 6/10 >> 6/12 Ceftazidime 6/08 >> 6/12 Ceftriaxone 6/12 >>    SUBJ: Severe asynchrony overnight, required nimbex.   VITAL SIGNS: Temp:  [98.2 F (36.8 C)-98.8 F (37.1 C)] 98.2 F (36.8 C) (06/14 0800) Pulse Rate:  [83-91] 83 (06/14 1123) Resp:  [13-35] 35 (06/14 1123) BP: (102-148)/(56-81) 134/65 mmHg (06/14 1123) SpO2:  [100 %] 100 % (06/14 1123) FiO2 (%):  [40 %-60 %] 40 % (06/14 1123) HEMODYNAMICS:   VENTILATOR SETTINGS: Vent Mode:  [-] PRVC FiO2 (%):  [40 %-60 %] 40 % Set Rate:  [35 bmp] 35 bmp Vt Set:  [470 mL] 470 mL PEEP:  [5 cmH20-10 cmH20] 5 cmH20 Plateau Pressure:  [18 cmH20-29 cmH20] 29 cmH20 INTAKE / OUTPUT:  Intake/Output Summary (Last 24 hours) at 01/17/15 1149 Last data filed at 01/17/15 1100  Gross per 24 hour  Intake 3010.35 ml  Output   4745 ml  Net -1734.65 ml    PHYSICAL EXAMINATION: General: RASS -2, + F/C Neuro: Sedated and paralyzed HEENT: NCAT, WNL Cardiovascular:  Reg, no M Lungs: coarse  throughout, no wheezes Abdomen: soft, diminished BS Ext: warm, no LE edema  LABS:  PULMONARY  Recent Labs Lab 01/16/15 0259 01/16/15 0442 01/16/15 0612 01/17/15 0418  PHART 7.345* 7.231* 7.217* 7.435  PCO2ART 44.0 63.2* 64.7* 40.5  PO2ART 46.0* 80.0 95.1 124*  HCO3 24.0 26.6* 25.3* 26.7*  TCO2 25 28 27.3 28.0  O2SAT 80.0 93.0 94.5 98.8   CBC  Recent Labs Lab 01/15/15 0500  01/16/15 0228 01/17/15 0435  HGB 8.6* 8.4* 7.7*  HCT 26.0* 25.7* 24.2*  WBC 10.5 13.1* 12.1*  PLT 136* 130* 115*    COAGULATION No results for input(s): INR in the last 168 hours.  CARDIAC    Recent Labs Lab 01/11/15 0435  TROPONINI 57.38*   No results for input(s): PROBNP in the last 168 hours.  CHEMISTRY  Recent Labs Lab 01/13/15 0415 01/14/15 0441 01/15/15 0500 01/16/15 0228 01/17/15 0435  NA 146* 147* 150* 150* 151*  K 3.9 3.7 3.4* 3.7 3.6  CL 109 111 114* 116* 114*  CO2 GLUCOSE 121* 136* 117* 141* 104*  BUN 64* 78* 58* 54* 54*  CREATININE 2.98* 2.43* 1.58* 1.43* 1.56*  CALCIUM 8.1* 8.0* 8.3* 8.1* 8.3*  MG  --   --   --   --  2.4  PHOS  --   --   --   --  3.5   Estimated Creatinine Clearance: 63.7 mL/min (by C-G formula based on Cr of 1.56).  LIVER  Recent Labs Lab 01/13/15 0415 01/13/15 1100 01/14/15 0441 01/15/15 0500 01/16/15 0228  AST 137* 126* 121* 84* 75*  ALT 255* 223* 216* 173* 144*  ALKPHOS 47 47 57 69 89  BILITOT 2.4* 2.4* 2.9* 3.0* 3.2*  PROT 5.3* 5.1* 5.5* 5.4* 5.5*  ALBUMIN 2.3* 2.1* 2.2* 2.2* 2.1*   INFECTIOUS  Recent Labs Lab 01/12/15 0450 01/13/15 0415 01/15/15 0500  PROCALCITON 8.90 9.80 5.15   ENDOCRINE CBG (last 3)   Recent Labs  01/16/15 2034 01/16/15 2327 01/17/15 0413  GLUCAP 98 109* 96   CXR: NSC ARDS pattern  ASSESSMENT / PLAN: CARDIOVASCULAR A:  Acute STEMI 6/05 Out of hospital VF arrest 6/05 Cardiogenic shock, resolved Hypertension, controlled Mild sinus tachycardia Septic shock 6/09, resolved 6/11 P:  Cards following. MAP goal 65-80 mmHg. Metoprolol initiated 6/12.  PULMONARY A:  VDRF post arrest 6/05 Pulm edema 6/06 Staph PNA 6/08 ARDS 6/09 P:   Cont vent support - settings reviewed and/or adjusted Cont vent bundle Hold SBT while paralyzed. D/C nimbex today. Daily CXR for now. Lasix x2 doses.  RENAL A:  AKI, nonoliguric 6/05 ? CKD (Cr 2.15 on  admission) Hypokalemia, recurrent Hypernatremia - worsening  Suspect post ATN diuresis P:   Monitor BMET intermittently Monitor I/Os Correct electrolytes as indicated KVO IVF Lasix x2 doses Increased free water to 300 ml q6 hours D5W IV 50 ml/hr.  GASTROINTESTINAL A:   Shock liver - improving transaminases Hyperbilirubinemia Biliary sludge by RUQ [place] 6/10 P:   SUP: enteral famotidine Cont TFs Monitor LFTs intermittently  HEMATOLOGIC A:   Anemia without overt blood loss Mild thrombocytopenia P:  DVT px: Brilinta, SCDs. Monitor CBC intermittently. Transfuse per usual guidelines.  INFECTIOUS A:   Elevated PCT MSSA PNA High fever P:   Monitor temp, WBC count. Micro and abx as above. Avoid acetaminophen due to elevated LFTs.  ENDOCRINE A:   Hyperglycemia without prior dx of DM P:   Cont SSI  NEUROLOGIC A:   Anoxic encephalopathy -  prognosis appears to be good ICU associated discomfort Currently paralyzed due to vent asynchrony  P:   RASS goal: -1, -2. D/C precedex. Fentanyl and propofol drips to be continued for now, will likely transition in AM depending on brain function. Daily WUA. D/C nimbex. Will consult neurology in AM after paralyzing medications resolve.  FAMILY  Family updated in detail 6/14  The patient is critically ill with multiple organ systems failure and requires high complexity decision making for assessment and support, frequent evaluation and titration of therapies, application of advanced monitoring technologies and extensive interpretation of multiple databases.   Critical Care Time devoted to patient care services described in this note is  35  Minutes. This time reflects time of care of this signee Dr Koren Bound. This critical care time does not reflect procedure time, or teaching time or supervisory time of PA/NP/Med student/Med Resident etc but could involve care discussion time.  Alyson Reedy, M.D. [facility]  Pulmonary/Critical Care Medicine. Pager: [phone]. After hours pager: [phone].

## 2015-01-17 NOTE — Progress Notes (Signed)
[facility] ADULT ICU REPLACEMENT PROTOCOL FOR AM LAB REPLACEMENT ONLY  The patient does apply for the [facility] Adult ICU Electrolyte Replacment Protocol based on the criteria listed below:   1. Is GFR >/= 40 ml/min? Yes.    Patient's GFR today is 59 2. Is urine output >/= 0.5 ml/kg/hr for the last 6 hours? Yes.   Patient's UOP is 3.0 ml/kg/hr 3. Is BUN < 60 mg/dL? Yes.    Patient's BUN today is 54 4. Abnormal electrolyte(s):K3.6 5. Ordered repletion with: per protocol 6. If a panic level lab has been reported, has the CCM MD in charge been notified? Yes.  .   Physician:  E Deterding,MD  [name] 01/17/2015 5:32 AM

## 2015-01-18 ENCOUNTER — Other Ambulatory Visit (HOSPITAL_COMMUNITY): Payer: Self-pay

## 2015-01-18 ENCOUNTER — Inpatient Hospital Stay (HOSPITAL_COMMUNITY): Payer: Medicaid Other

## 2015-01-18 DIAGNOSIS — G931 Anoxic brain damage, not elsewhere classified: Secondary | ICD-10-CM | POA: Diagnosis present

## 2015-01-18 DIAGNOSIS — G9340 Encephalopathy, unspecified: Secondary | ICD-10-CM

## 2015-01-18 LAB — BASIC METABOLIC PANEL
Anion gap: 8 (ref 5–15)
BUN: 46 mg/dL — AB (ref 6–20)
CHLORIDE: 112 mmol/L — AB (ref 101–111)
CO2: 28 mmol/L (ref 22–32)
CREATININE: 1.54 mg/dL — AB (ref 0.61–1.24)
Calcium: 7.9 mg/dL — ABNORMAL LOW (ref 8.9–10.3)
GFR calc Af Amer: >60 mL/min (ref >60)
GFR calc non Af Amer: 52 mL/min — ABNORMAL LOW (ref >60)
Glucose, Bld: 116 mg/dL — ABNORMAL HIGH (ref 65–99)
Potassium: 3.7 mmol/L (ref 3.5–5.1)
Sodium: 148 mmol/L — ABNORMAL HIGH (ref 135–145)

## 2015-01-18 LAB — GLUCOSE, CAPILLARY
GLUCOSE-CAPILLARY: 119 mg/dL — AB (ref 65–99)
Glucose-Capillary: 111 mg/dL — ABNORMAL HIGH (ref 65–99)
Glucose-Capillary: 117 mg/dL — ABNORMAL HIGH (ref 65–99)
Glucose-Capillary: 127 mg/dL — ABNORMAL HIGH (ref 65–99)
Glucose-Capillary: 115 mg/dL — ABNORMAL HIGH (ref 65–99)
Glucose-Capillary: 142 mg/dL — ABNORMAL HIGH (ref 65–99)

## 2015-01-18 LAB — BLOOD GAS, ARTERIAL
Acid-Base Excess: 3.4 mmol/L — ABNORMAL HIGH (ref 0.0–2.0)
Bicarbonate: 27 mEq/L — ABNORMAL HIGH (ref 20.0–24.0)
DRAWN BY: 42624
FIO2: 0.4 %
LHR: 35 {breaths}/min
MECHVT: 470 mL
O2 Saturation: 97.1 %
PATIENT TEMPERATURE: 98.6
PCO2 ART: 38.6 mmHg (ref 35.0–45.0)
PEEP/CPAP: 5 cmH2O
PO2 ART: 86.6 mmHg (ref 80.0–100.0)
TCO2: 28.2 mmol/L (ref 0–100)
pH, Arterial: 7.459 — ABNORMAL HIGH (ref 7.350–7.450)

## 2015-01-18 LAB — CBC
HCT: 23 % — ABNORMAL LOW (ref 39.0–52.0)
Hemoglobin: 7.3 g/dL — ABNORMAL LOW (ref 13.0–17.0)
MCH: 27.8 pg (ref 26.0–34.0)
MCHC: 31.7 g/dL (ref 30.0–36.0)
MCV: 87.5 fL (ref 78.0–100.0)
Platelets: 160 10*3/uL (ref 150–400)
RBC: 2.63 MIL/uL — ABNORMAL LOW (ref 4.22–5.81)
RDW: 18.4 % — AB (ref 11.5–15.5)
WBC: 13.9 10*3/uL — ABNORMAL HIGH (ref 4.0–10.5)

## 2015-01-18 LAB — PHOSPHORUS: Phosphorus: 3.8 mg/dL (ref 2.5–4.6)

## 2015-01-18 LAB — MAGNESIUM: Magnesium: 2.3 mg/dL (ref 1.7–2.4)

## 2015-01-18 MED ORDER — DEXTROSE 5 % IV SOLN
2.0000 g | Freq: Three times a day (TID) | INTRAVENOUS | Status: DC
  Administered 2015-01-18 – 2015-01-20 (×6): 2 g via INTRAVENOUS
  Filled 2015-01-18 (×8): qty 2

## 2015-01-18 MED ORDER — QUETIAPINE FUMARATE 25 MG PO TABS
25.0000 mg | ORAL_TABLET | Freq: Two times a day (BID) | ORAL | Status: DC
Start: 2015-01-18 — End: 2015-01-20
  Administered 2015-01-18 – 2015-01-19 (×3): 25 mg via ORAL
  Filled 2015-01-18 (×6): qty 1

## 2015-01-18 MED ORDER — METOLAZONE 5 MG PO TABS
5.0000 mg | ORAL_TABLET | Freq: Every day | ORAL | Status: AC
  Administered 2015-01-18: 5 mg via ORAL
  Filled 2015-01-18: qty 1

## 2015-01-18 MED ORDER — METOPROLOL TARTRATE 25 MG/10 ML ORAL SUSPENSION
25.0000 mg | Freq: Four times a day (QID) | ORAL | Status: DC
  Administered 2015-01-18 – 2015-01-19 (×4): 25 mg
  Filled 2015-01-18 (×8): qty 10

## 2015-01-18 MED ORDER — POTASSIUM CHLORIDE 10 MEQ/50ML IV SOLN
10.0000 meq | INTRAVENOUS | Status: AC
Start: 2015-01-18 — End: 2015-01-18
  Administered 2015-01-18 (×2): 10 meq via INTRAVENOUS
  Filled 2015-01-18 (×2): qty 50

## 2015-01-18 MED ORDER — DEXMEDETOMIDINE HCL IN NACL 400 MCG/100ML IV SOLN
0.4000 ug/kg/h | INTRAVENOUS | Status: DC
  Administered 2015-01-18 (×2): 1.1 ug/kg/h via INTRAVENOUS
  Administered 2015-01-18: 1.2 ug/kg/h via INTRAVENOUS
  Administered 2015-01-18: 0.4 ug/kg/h via INTRAVENOUS
  Administered 2015-01-18: 1 ug/kg/h via INTRAVENOUS
  Administered 2015-01-19: 0.4 ug/kg/h via INTRAVENOUS
  Filled 2015-01-18 (×2): qty 50
  Filled 2015-01-18: qty 100
  Filled 2015-01-18 (×2): qty 50
  Filled 2015-01-18: qty 100
  Filled 2015-01-18: qty 50
  Filled 2015-01-18: qty 100
  Filled 2015-01-18: qty 50

## 2015-01-18 MED ORDER — VANCOMYCIN HCL 10 G IV SOLR
1250.0000 mg | Freq: Two times a day (BID) | INTRAVENOUS | Status: DC
  Administered 2015-01-18 – 2015-01-19 (×3): 1250 mg via INTRAVENOUS
  Filled 2015-01-18 (×5): qty 1250

## 2015-01-18 MED ORDER — AMLODIPINE 1 MG/ML ORAL SUSPENSION
5.0000 mg | Freq: Every day | ORAL | Status: DC
  Administered 2015-01-18: 5 mg
  Filled 2015-01-18 (×3): qty 5

## 2015-01-18 MED ORDER — FUROSEMIDE 10 MG/ML IJ SOLN
40.0000 mg | Freq: Three times a day (TID) | INTRAMUSCULAR | Status: AC
  Administered 2015-01-18 (×2): 40 mg via INTRAVENOUS
  Filled 2015-01-18 (×2): qty 4

## 2015-01-18 MED ORDER — VANCOMYCIN HCL 10 G IV SOLR
1500.0000 mg | Freq: Once | INTRAVENOUS | Status: AC
  Administered 2015-01-18: 1500 mg via INTRAVENOUS
  Filled 2015-01-18: qty 1500

## 2015-01-18 NOTE — Progress Notes (Addendum)
ANTIBIOTIC CONSULT NOTE - FOLLOW UP  Pharmacy Consult for ceftazidime and vancomycin  Indication: MSSA PNA  Not on File  Patient Measurements: Height:  (170.2 cm) Weight: 205 lb 7.9 oz (93.21 kg) IBW/kg (Calculated) : 66.1  Vital Signs: Temp: 100.6 F (38.1 C) (06/15 0900) Temp Source: Core (Comment) (06/15 0800) BP: 163/90 mmHg (06/15 0835) Pulse Rate: 113 (06/15 0835) Intake/Output from previous day: 06/14 0701 - 06/15 0700 In: 3330.9 [I.V.:2060.9; NG/GT:1170; IV Piggyback:100] Out: 3480 [Urine:3480] Intake/Output from this shift: Total I/O In: 50 [IV Piggyback:50] Out: 395 [Urine:395]  Labs:  Recent Labs  01/16/15 0228 01/17/15 0435 01/18/15 0430  WBC 13.1* 12.1* 13.9*  HGB 8.4* 7.7* 7.3*  PLT 130* 115* 160  CREATININE 1.43* 1.56* 1.54*   Estimated Creatinine Clearance: 64.5 mL/min (by C-G formula based on Cr of 1.54). No results for input(s): VANCOTROUGH, VANCOPEAK, VANCORANDOM, GENTTROUGH, GENTPEAK, GENTRANDOM, TOBRATROUGH, TOBRAPEAK, TOBRARND, AMIKACINPEAK, AMIKACINTROU, AMIKACIN in the last 72 hours.   Microbiology: Recent Results (from the past 720 hour(s))  MRSA PCR Screening     Status: None   Collection Time: 01/09/15  9:45 AM  Result Value Ref Range Status   MRSA by PCR NEGATIVE NEGATIVE Final    Comment:        The GeneXpert MRSA Assay (FDA approved for NASAL specimens only), is one component of a comprehensive MRSA colonization surveillance program. It is not intended to diagnose MRSA infection nor to guide or monitor treatment for MRSA infections.   Culture, respiratory (NON-Expectorated)     Status: None   Collection Time: 01/11/15 11:44 AM  Result Value Ref Range Status   Specimen Description TRACHEAL ASPIRATE  Final   Special Requests NONE  Final   Gram Stain   Final    FEW WBC PRESENT,BOTH PMN AND MONONUCLEAR RARE SQUAMOUS EPITHELIAL CELLS PRESENT NO ORGANISMS SEEN Performed at [facility]    Culture   Final   ABUNDANT STAPHYLOCOCCUS AUREUS Note: RIFAMPIN AND GENTAMICIN SHOULD NOT BE USED AS SINGLE DRUGS FOR TREATMENT OF STAPH INFECTIONS. Performed at [facility]    Report Status 01/14/2015 FINAL  Final   Organism ID, Bacteria STAPHYLOCOCCUS AUREUS  Final      Susceptibility   Staphylococcus aureus - MIC*    CLINDAMYCIN <=0.25 SENSITIVE Sensitive     ERYTHROMYCIN <=0.25 SENSITIVE Sensitive     GENTAMICIN <=0.5 SENSITIVE Sensitive     LEVOFLOXACIN <=0.12 SENSITIVE Sensitive     OXACILLIN 0.5 SENSITIVE Sensitive     PENICILLIN >=0.5 RESISTANT Resistant     RIFAMPIN <=0.5 SENSITIVE Sensitive     TRIMETH/SULFA <=10 SENSITIVE Sensitive     VANCOMYCIN 1 SENSITIVE Sensitive     TETRACYCLINE 2 SENSITIVE Sensitive     MOXIFLOXACIN <=0.25 SENSITIVE Sensitive     * ABUNDANT STAPHYLOCOCCUS AUREUS  Culture, blood (routine x 2)     Status: None (Preliminary result)   Collection Time: 01/13/15 12:13 AM  Result Value Ref Range Status   Specimen Description BLOOD RIGHT HAND  Final   Special Requests BOTTLES DRAWN AEROBIC ONLY 10CC  Final   Culture   Final           BLOOD CULTURE RECEIVED NO GROWTH TO DATE CULTURE WILL BE HELD FOR 5 DAYS BEFORE ISSUING A FINAL NEGATIVE REPORT Performed at [facility]    Report Status PENDING  Incomplete  Culture, blood (routine x 2)     Status: None (Preliminary result)   Collection Time: 01/13/15 12:22 AM  Result  Value Ref Range Status   Specimen Description BLOOD LEFT ANTECUBITAL  Final   Special Requests BOTTLES DRAWN AEROBIC ONLY 10CC  Final   Culture   Final           BLOOD CULTURE RECEIVED NO GROWTH TO DATE CULTURE WILL BE HELD FOR 5 DAYS BEFORE ISSUING A FINAL NEGATIVE REPORT Performed at [facility]    Report Status PENDING  Incomplete  Culture, respiratory (NON-Expectorated)     Status: None   Collection Time: 01/13/15 10:10 AM  Result Value Ref Range Status   Specimen Description TRACHEAL ASPIRATE  Final   Special Requests  NONE  Final   Gram Stain   Final    MODERATE WBC PRESENT,BOTH PMN AND MONONUCLEAR RARE SQUAMOUS EPITHELIAL CELLS PRESENT NO ORGANISMS SEEN Performed at [facility]    Culture   Final    MODERATE STAPHYLOCOCCUS AUREUS Note: RIFAMPIN AND GENTAMICIN SHOULD NOT BE USED AS SINGLE DRUGS FOR TREATMENT OF STAPH INFECTIONS. Performed at [facility]    Report Status 01/15/2015 FINAL  Final   Organism ID, Bacteria STAPHYLOCOCCUS AUREUS  Final      Susceptibility   Staphylococcus aureus - MIC*    CLINDAMYCIN <=0.25 SENSITIVE Sensitive     ERYTHROMYCIN 0.5 SENSITIVE Sensitive     GENTAMICIN <=0.5 SENSITIVE Sensitive     LEVOFLOXACIN <=0.12 SENSITIVE Sensitive     OXACILLIN 0.5 SENSITIVE Sensitive     PENICILLIN >=0.5 RESISTANT Resistant     RIFAMPIN <=0.5 SENSITIVE Sensitive     TRIMETH/SULFA <=10 SENSITIVE Sensitive     VANCOMYCIN 1 SENSITIVE Sensitive     TETRACYCLINE <=1 SENSITIVE Sensitive     MOXIFLOXACIN <=0.25 SENSITIVE Sensitive     * MODERATE STAPHYLOCOCCUS AUREUS    Anti-infectives    Start     Dose/Rate Route Frequency Ordered Stop   01/15/15 1500  cefTRIAXone (ROCEPHIN) 2 g in dextrose 5 % 50 mL IVPB - Premix  Status:  Discontinued     2 g 100 mL/hr over 30 Minutes Intravenous Every 24 hours 01/15/15 1500 01/18/15 0902   01/13/15 1000  linezolid (ZYVOX) IVPB 600 mg  Status:  Discontinued     600 mg 300 mL/hr over 60 Minutes Intravenous Every 12 hours 01/13/15 0842 01/14/15 1313   01/13/15 1000  ciprofloxacin (CIPRO) IVPB 400 mg  Status:  Discontinued     400 mg 200 mL/hr over 60 Minutes Intravenous Every 12 hours 01/13/15 0859 01/15/15 1049   01/11/15 1700  cefTAZidime (FORTAZ) 2 g in dextrose 5 % 50 mL IVPB  Status:  Discontinued     2 g 100 mL/hr over 30 Minutes Intravenous Every 12 hours 01/11/15 1612 01/16/15 1043   01/11/15 1700  vancomycin (VANCOCIN) IVPB 750 mg/150 ml premix  Status:  Discontinued     750 mg 150 mL/hr over 60 Minutes Intravenous  Every 12 hours 01/11/15 1612 01/13/15 0842      Assessment: 48 yoM continues on D#7 abx for MSSA PNA. Antibiotics rebroadened today to fortaz and vancomycin.   Tmax 102.3 overnight, wbc increased to 13.9, new blood cultures being obtained. PCT trended down as of 6/12 9.8>5.15.   6/8 Vanc>>6/10, 6/15>> 6/10 Linezolid>>6/12 6/8 Ceftaz>>6/12, 6/15>> 6/11 CTX >>6/15 6/10 Cipro>>6/11 6/8 resp cx>> abundant staph aureus- MSSA 6/10 resp cx - MSSA 6/10 blood x2>>ngtd 6/14 bld x2 >>sent  Goal of Therapy:  Vancomycin goal trough 15-20  Plan:  Discontinue ceftriaxone Restart fortaz 2g q8 hours Restart vancomycin 1500mg  IV x1  then 1250 q12 hours Monitor clinical progress and renal function F/u LOT  Thank you for allowing pharmacy to be part of this patient's care team  Sheppard Coil PharmD., BCPS Clinical Pharmacist Pager [phone] 01/18/2015 9:56 AM

## 2015-01-18 NOTE — Progress Notes (Addendum)
PULMONARY / CRITICAL CARE MEDICINE   Name: Alexander Berry MRN: [id number] DOB: 1965/11/07    ADMISSION DATE:  01/08/2015 CONSULTATION DATE:  01/08/2015    PT PROFILE:   VF arrest while playing soccer 6/05. Prolonged ACLS. DES to RCA. Hypothermia protocol  MAJOR EVENTS/TEST RESULTS: 6/05 Admitted after OOH arrest 6/05 LHC: Dist RCA lesion, 80% stenosed. Mid RCA lesion, 100% stenosed. There is a 0% residual stenosis post intervention. A drug-eluting stent was placed. 2nd RPLB-1 lesion, 100% stenosed. 2nd RPLB-2 lesion, 100% stenosed. There is a 0% residual stenosis post intervention. RPDA lesion, 80% stenosed. There is a 0% residual stenosis post intervention.  6/06 EEG: Abnormal EEG due to the presence of a generalized slowing indicating a mild to moderate cerebral disturbance (encephalopathy). No epileptiform activity noted 6/06 TTE: The estimated ejection fraction was in the range of 55% to 60%. Mild posterior wall hypokinesis - decreased Definity microsphere uptake in the posterior wall suggests hypoperfusion  6/07 Rewarmed. Continuous sedatives stopped. RASS -5. No spont movement 6/07 CT head: NAD 6/07 repeat EEG: Abnormal EEG due to the presence of a generalized slowing indicating a mild to moderate cerebral disturbance (encephalopathy). No epileptiform activity noted 6/08 Increased agitation requiring sedation. Not F/C. Increased O2 reqts. Frothy pink sputum. Resp culture obatined. PCT elevated. Empiric abx initiated. CXR c/w increased pulm edema. Hypertensive. Furosemide ordered 6/08 repeat limited TTE: LVEF 50%. Inferior wall AK 6/09 Intermittently F/C. RASS -2 to +1. Requiring 70% O2. Dexmedetomidine initiated 6/09 PM: self extubated, required immediate re-intubation. High fevers - cooling blanket initiated. Hypotension - NE initiated 6/10 High fevers persist. Arctic sun device re-ordered to maintain T < 99.5. PCT remained elevated. Abx adjusted. Cr rising. New dx of severe  sepsis/septic shock rendered. Elevated bilirubin  6/10 RUQ [place]: biliary sludge. No obstruction 6/11 Off vasopressors. Fevers controlled. Cr improving. No WUA due to increasing agitation 6/12 ARDS persists. AKI improving. Worsening hypernatremia - D5W initiated  INDWELLING DEVICES: Femoral sheath 6/05 >> 6/07 ETT 6/05 >>  R IJ CVL 6/05 >>    MICRO DATA: Resp 6/08 >> MSSA resp 6/10 >> MSSA  Blood 6/10 >>   PCT 6/08: 7.46           6/09: 8.90          6/10: 9.80          6/12: 5.15  ANTIMICROBIALS:  Vanc 6/08 >> stopped at one point then restarted 6/15>>> Linezolid 6/10 >> 6/11  Ciprofloxacin 6/10 >> 6/12 Ceftazidime 6/08 >> 6/12>>>6/15>>> Ceftriaxone 6/12 >> 6/15  SUBJ: No longer paralyzed, moving following commands.  Very agitated.  VITAL SIGNS: Temp:  [99 F (37.2 C)-101.3 F (38.5 C)] 100.4 F (38 C) (06/15 0600) Pulse Rate:  [83-113] 113 (06/15 0835) Resp:  [17-35] 27 (06/15 0809) BP: (134-163)/(65-90) 163/90 mmHg (06/15 0835) SpO2:  [99 %-100 %] 100 % (06/15 0809) FiO2 (%):  [40 %] 40 % (06/15 0809)  HEMODYNAMICS:   VENTILATOR SETTINGS: Vent Mode:  [-] PSV;CPAP FiO2 (%):  [40 %] 40 % Set Rate:  [35 bmp] 35 bmp Vt Set:  [470 mL] 470 mL PEEP:  [5 cmH20] 5 cmH20 Pressure Support:  [10 cmH20] 10 cmH20 Plateau Pressure:  [14 cmH20-29 cmH20] 21 cmH20   INTAKE / OUTPUT:  Intake/Output Summary (Last 24 hours) at 01/18/15 0840 Last data filed at 01/18/15 0610  Gross per 24 hour  Intake 3176.87 ml  Output   3420 ml  Net -243.13 ml   PHYSICAL EXAMINATION: General: RASS -2, moving  all ext to commands but is very agitated. Neuro: Sedated, moving all ext spontaneously and to command, very agitated. HEENT: [place]/AT, PERRL, EOM-I and MMM. Cardiovascular:  RRR, Nl S1/S2, -M/R/G. Lungs: Coarse BS diffusely. Abdomen: Soft, NT, ND and +BS. Ext: Warm, no LE edema  LABS:  PULMONARY  Recent Labs Lab 01/16/15 0259 01/16/15 0442 01/16/15 0612 01/17/15 0418  01/18/15 0317  PHART 7.345* 7.231* 7.217* 7.435 7.459*  PCO2ART 44.0 63.2* 64.7* 40.5 38.6  PO2ART 46.0* 80.0 95.1 124* 86.6  HCO3 24.0 26.6* 25.3* 26.7* 27.0*  TCO2 25 28 27.3 28.0 28.2  O2SAT 80.0 93.0 94.5 98.8 97.1   CBC  Recent Labs Lab 01/16/15 0228 01/17/15 0435 01/18/15 0430  HGB 8.4* 7.7* 7.3*  HCT 25.7* 24.2* 23.0*  WBC 13.1* 12.1* 13.9*  PLT 130* 115* 160   COAGULATION No results for input(s): INR in the last 168 hours.  CARDIAC   No results for input(s): TROPONINI in the last 168 hours. No results for input(s): PROBNP in the last 168 hours.  CHEMISTRY  Recent Labs Lab 01/14/15 0441 01/15/15 0500 01/16/15 0228 01/17/15 0435 01/18/15 0430  NA 147* 150* 150* 151* 148*  K 3.7 3.4* 3.7 3.6 3.7  CL 111 114* 116* 114* 112*  CO2 GLUCOSE 136* 117* 141* 104* 116*  BUN 78* 58* 54* 54* 46*  CREATININE 2.43* 1.58* 1.43* 1.56* 1.54*  CALCIUM 8.0* 8.3* 8.1* 8.3* 7.9*  MG  --   --   --  2.4 2.3  PHOS  --   --   --  3.5 3.8   Estimated Creatinine Clearance: 64.5 mL/min (by C-G formula based on Cr of 1.54).  LIVER  Recent Labs Lab 01/13/15 0415 01/13/15 1100 01/14/15 0441 01/15/15 0500 01/16/15 0228  AST 137* 126* 121* 84* 75*  ALT 255* 223* 216* 173* 144*  ALKPHOS 47 47 57 69 89  BILITOT 2.4* 2.4* 2.9* 3.0* 3.2*  PROT 5.3* 5.1* 5.5* 5.4* 5.5*  ALBUMIN 2.3* 2.1* 2.2* 2.2* 2.1*   INFECTIOUS  Recent Labs Lab 01/12/15 0450 01/13/15 0415 01/15/15 0500  PROCALCITON 8.90 9.80 5.15   ENDOCRINE CBG (last 3)   Recent Labs  01/17/15 1942 01/18/15 0011 01/18/15 0419  GLUCAP 101* 117* 111*   CXR: NSC ARDS pattern  ASSESSMENT / PLAN: CARDIOVASCULAR A:  Acute STEMI 6/05 Out of hospital VF arrest 6/05 Cardiogenic shock, resolved Hypertension, controlled Mild sinus tachycardia Septic shock 6/09, resolved 6/11 P:  Cards following. MAP goal 65 mmHg. Metoprolol initiated 6/12 at 25 mg BID. Add norvasc 5 mg PO daily with  holding parameters  PULMONARY A:  VDRF post arrest 6/05 Pulm edema 6/06 Staph PNA 6/08 ARDS 6/09 P:   Begin PS trials now that patient is no longer paralyzed. Cont vent bundle Attempt to keep off nimbex. Lasix as below. Daily CXR for now.  RENAL A:  AKI, nonoliguric 6/05 ? CKD (Cr 2.15 on admission) Hypokalemia, recurrent Hypernatremia - worsening  Suspect post ATN diuresis P:   Monitor BMET intermittently Monitor I/Os Correct electrolytes as indicated KVO IVF Lasix x2 doses. Zaroxolyn x2 doses. PO K.  Increased free water to 300 ml q6 hours D5W IV 50 ml/hr.  GASTROINTESTINAL A:   Shock liver - improving transaminases Hyperbilirubinemia Biliary sludge by RUQ [place] 6/10 P:   SUP: enteral famotidine Cont TFs Monitor LFTs intermittently  HEMATOLOGIC A:   Anemia without overt blood loss Mild thrombocytopenia P:  DVT px: Brilinta, SCDs. Monitor CBC intermittently. Transfuse  per usual guidelines.  INFECTIOUS A:   Elevated PCT MSSA PNA Fever P:   Monitor temp, WBC count. Micro and abx as above.  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 Add precedex since off paralytic. Continue fentanyl and d/c propofol. Seroquel 25 BID but will no increase further in concern for QT interval. Daily WUA. Keep off nimbex. Neurology consult called, will defer to neuro regarding EEG and CT.  FAMILY  Family updated in detail 6/15  The patient is critically ill with multiple organ systems failure and requires high complexity decision making for assessment and support, frequent evaluation and titration of therapies, application of advanced monitoring technologies and extensive interpretation of multiple databases.   Critical Care Time devoted to patient care services described in  this note is  35  Minutes. This time reflects time of care of this signee Dr Koren Bound. This critical care time does not reflect procedure time, or teaching time or supervisory time of PA/NP/Med student/Med Resident etc but could involve care discussion time.  Alyson Reedy, M.D. [facility] Pulmonary/Critical Care Medicine. Pager: [phone]. After hours pager: [phone].

## 2015-01-18 NOTE — Progress Notes (Signed)
eLink Physician-Brief Progress Note Patient Name: Alexander Berry DOB: October 22, 1965 MRN: [id number]   Date of Service  01/18/2015  HPI/Events of Note  Failed precedex, for CT  eICU Interventions  Back to [prop, sys 180     Intervention Category Intermediate Interventions: Change in mental status - evaluation and management  [name] J. 01/18/2015, 11:46 PM

## 2015-01-18 NOTE — Procedures (Signed)
Rate decreased to 26 per order.

## 2015-01-18 NOTE — Progress Notes (Signed)
[facility] ADULT ICU REPLACEMENT PROTOCOL FOR AM LAB REPLACEMENT ONLY  The patient does apply for the [facility] Adult ICU Electrolyte Replacment Protocol based on the criteria listed below:   1. Is GFR >/= 40 ml/min? Yes.    Patient's GFR today is >60 2. Is urine output >/= 0.5 ml/kg/hr for the last 6 hours? Yes.   Patient's UOP is 1.9 ml/kg/hr 3. Is BUN < 60 mg/dL? Yes.    Patient's BUN today is 46 4. Abnormal electrolyte(s):K3.7 5. Ordered repletion with: 55meq/IV 6. If a panic level lab has been reported, has the CCM MD in charge been notified? Yes.  .   Physician:  E Deterding,MD  [name] 01/18/2015 5:57 AM

## 2015-01-18 NOTE — Progress Notes (Signed)
Subjective:  Agitated moving on vent  Objective:   Vital Signs : Filed Vitals:   01/18/15 0800 01/18/15 0809 01/18/15 0835 01/18/15 0900  BP:  150/68 163/90   Pulse:  100 113   Temp: 100.9 F (38.3 C)   100.6 F (38.1 C)  TempSrc: Core (Comment)     Resp: 23 27  44  Height:      Weight:      SpO2: 100% 100%  86%    Intake/Output from previous day:  Intake/Output Summary (Last 24 hours) at 01/18/15 1610 Last data filed at 01/18/15 0800  Gross per 24 hour  Intake 3149.87 ml  Output   3815 ml  Net -665.13 ml    I/O since admission: - 1196  Wt Readings from Last 3 Encounters:  01/14/15 93.21 kg (205 lb 7.9 oz)    Medications: . amLODipine  5 mg Per Tube Daily  . antiseptic oral rinse  7 mL Mouth Rinse QID  . aspirin  81 mg Per Tube Daily  . chlorhexidine  15 mL Mouth Rinse BID  . famotidine  20 mg Per Tube Daily  . feeding supplement (PRO-STAT SUGAR FREE 64)  30 mL Per Tube TID  . free water  300 mL Per Tube Q6H  . furosemide  40 mg Intravenous Q8H  . insulin aspart  0-15 Units Subcutaneous 6 times per day  . metolazone  5 mg Oral Daily  . metoprolol tartrate  25 mg Per Tube Q8H  . QUEtiapine  25 mg Oral BID  . ticagrelor  90 mg Per Tube BID    . sodium chloride Stopped (01/17/15 1300)  . cisatracurium (NIMBEX) infusion Stopped (01/17/15 1115)  . dexmedetomidine    . dextrose 50 mL/hr at 01/17/15 1900  . feeding supplement (VITAL HIGH PROTEIN) 1,000 mL (01/17/15 1629)  . fentaNYL infusion INTRAVENOUS 300 mcg/hr (01/18/15 0002)  . propofol (DIPRIVAN) infusion 30 mcg/kg/min (01/18/15 9604)    Physical Exam:   General appearance: sedated on vent Neck: no adenopathy, no carotid bruit, no JVD, supple, symmetrical, trachea midline and thyroid not enlarged, symmetric, no tenderness/mass/nodules Lungs: no wheezing Heart: regular rate and rhythm and no s3 Abdomen: soft, non-tender; BS + but diminished; no masses,  no organomegaly Extremities: no edema,  redness or tenderness in the calves or thighs Skin: Skin color, texture, turgor normal. No rashes or lesions Neurologic: no focal defects   Rate: 115  Rhythm: ST  ECG (independently read by me): from 01/11/15:  ST at 102 with evolving STT changes inferolaterally from MI but with preserved inferior R waves suggesting myocardial salvage.  ECG (independently read by me): from 01/17/15:  NSR at 90; inferior posterior infarct  With Q waves inferiorly but preserved R waves suggesting myocardial salvage with early transiton due to posterior wall involvement. Lab Results:   Recent Labs  01/16/15 0228 01/17/15 0435 01/18/15 0430  NA 150* 151* 148*  K 3.7 3.6 3.7  CL 116* 114* 112*  CO2 GLUCOSE 141* 104* 116*  BUN 54* 54* 46*  CREATININE 1.43* 1.56* 1.54*  CALCIUM 8.1* 8.3* 7.9*  MG  --  2.4 2.3  PHOS  --  3.5 3.8     Recent Labs  01/16/15 0228 01/17/15 0435 01/18/15 0430  WBC 13.1* 12.1* 13.9*  HGB 8.4* 7.7* 7.3*  HCT 25.7* 24.2* 23.0*  MCV 85.7 88.0 87.5  PLT 130* 115* 160    No results for input(s): TROPONINI in the last 72  hours.  Invalid input(s): CK, MB  No results found for: TSH   Recent Labs  01/16/15 0228  PROT 5.5*  ALBUMIN 2.1*  AST 75*  ALT 144*  ALKPHOS 89  BILITOT 3.2*   No results for input(s): INR in the last 72 hours. BNP (last 3 results) No results for input(s): BNP in the last 8760 hours.  ProBNP (last 3 results) No results for input(s): PROBNP in the last 8760 hours.   Lipid Panel     Component Value Date/Time   TRIG 210* 01/16/2015 0228     No results for input(s): HGBA1C in the last 72 hours.   Imaging:  Dg Chest Port 1 View  01/18/2015   CLINICAL DATA:  Hypoxia  EXAM: PORTABLE CHEST - 1 VIEW  COMPARISON:  January 17, 2015  FINDINGS: Endotracheal tube tip is 2.0 cm above the carina. Nasogastric tube tip and side port are below the diaphragm. Central catheter tip is in the superior vena cava. No pneumothorax. There is  moderate interstitial edema bilaterally, stable. There is no appreciable airspace consolidation. Heart is upper normal in size with pulmonary vascular within normal limits. No adenopathy.  IMPRESSION: Tube and catheter positions as described without pneumothorax. Moderate interstitial edema, stable. No new opacity. No change in cardiac silhouette.   Electronically Signed   By: Bretta Bang III M.D.   On: 01/18/2015 07:15   Dg Chest Port 1 View  01/17/2015   CLINICAL DATA:  49 year old male status post cardiac arrest, respiratory failure with hypoxia. Intubated. Initial encounter.  EXAM: PORTABLE CHEST - 1 VIEW  COMPARISON:  01/16/2015 and earlier.  FINDINGS: Portable AP semi upright view at 0513 hours. Stable endotracheal tube tip just below the clavicles. Stable right IJ central line. Enteric tube in place, courses to the left abdomen with tip not included.  Stable lung volumes. Stable cardiac size and mediastinal contours. Widespread coarse pulmonary opacity with mildly improved bilateral ventilation over this series of exams since 01/12/2015. No pneumothorax or pleural effusion.  IMPRESSION: 1.  Stable lines and tubes. 2. Widespread coarse pulmonary opacity with slowly improving bilateral ventilation since 01/12/2015. No new cardiopulmonary abnormality.   Electronically Signed   By: Odessa Fleming M.D.   On: 01/17/2015 07:32      Assessment/Plan:   Active Problems:   Cardiac arrest   Acute ST elevation myocardial infarction (STEMI) involving right coronary artery   Acute respiratory failure with hypoxia   Hypokalemia   Acute pulmonary edema   Hyperglycemia   Encephalopathy acute   Pneumonia, organism unspecified   Encounter for intubation   Septic shock   ARDS (adult respiratory distress syndrome)   Thrombocytopenia   Staphylococcal pneumonia   AKI (acute kidney injury)  1. Day 10 s/p  Out of hospital VF cardiac arrest/Inferior STEMI s/p emergent PCI by me and PCI/DES to mid RCA occlusion  with  very large dominant RCA with extensive thrombus burden and distal embolization with PTCA and opening of 2 distal branches. Myocardial salvage suggested by ECG and echo.  EF 50% by echo 2. Respiratory failure; re-intubated after self-extubation on 6/9 3. Sepsis/ARDS/Staph pneumonia: WBC increased to 13.9  today on fortaz and rocephin  4. Increase LFT: prob secondary to shock liver; improving 5.Renal insufficiency:  Cr 1.54 today; 6. Anemia: 7.7/24 7. Hypernatremia: Improving Na 148 today with free water  On ASA/Brilinta for DAPT;  Some BP lability, P in 90's with sinus rhythm; no further VT off amiodarone. Amlodipine to be started with BP  elevation. Will increase metoprolol to 25 mg every 6 hrs with tachycardia rather than start coreg.  Weaning protocol from vent per CCM.   Lennette Bihari, MD, [facility] 01/18/2015, 9:22 AM

## 2015-01-18 NOTE — Consult Note (Signed)
Reason for Consult: anoxic encephalopathy Referring Physician: Dr Sung Amabile  CC: S/P cardiac arrest  HPI: Alexander Berry is a 49 y.o. male admitted to [facility] on 01/08/2015 after the patient collapsed while playing soccer. CPR was initiated at the scene and the patient was found to be in V. tach. Defibrillation attempts were performed  8 and the patient was brought to the cardiac Cath Lab secondary to an ST elevation MI. 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 The patient is still on a ventilator. Neurology has been asked to evaluate the patient for anoxic encephalopathy. The patient does not speak [place]. Fortunately his nurse today is fluent in [place]. She reports that the patient has followed occasional commands for her; however, this has been inconsistent. The patient has been agitated at times and is on diprivan and fentanyl.  Past Medical History  Diagnosis Date  . Hypertension     Past Surgical History  Procedure Laterality Date  . Cardiac catheterization N/A 01/08/2015    Procedure: Left Heart Cath and Coronary Angiography;  Surgeon: Lennette Bihari, MD;  Location: [facility] INVASIVE CV LAB;  Service: Cardiovascular;  Laterality: N/A;  . Cardiac catheterization  01/08/2015    Procedure: Coronary Stent Intervention;  Surgeon: Lennette Bihari, MD;  Location: [facility] INVASIVE CV LAB;  Service: Cardiovascular;;  . Cardiac catheterization  01/08/2015    Procedure: Coronary Balloon Angioplasty;  Surgeon: Lennette Bihari, MD;  Location: [facility] INVASIVE CV LAB;  Service: Cardiovascular;;  . Electrophysiologic study  01/08/2015    Procedure: Cardioversion;  Surgeon: Lennette Bihari, MD;  Location: [facility] INVASIVE CV LAB;  Service: Cardiovascular;;    Family History  Problem Relation Age of Onset  . Hypertension Father     Social History:  has no tobacco, alcohol, and drug history on file.  Not on  File  Medications:  Scheduled: . amLODipine  5 mg Per Tube Daily  . antiseptic oral rinse  7 mL Mouth Rinse QID  . aspirin  81 mg Per Tube Daily  . cefTAZidime (FORTAZ)  IV  2 g Intravenous Q8H  . chlorhexidine  15 mL Mouth Rinse BID  . famotidine  20 mg Per Tube Daily  . feeding supplement (PRO-STAT SUGAR FREE 64)  30 mL Per Tube TID  . free water  300 mL Per Tube Q6H  . furosemide  40 mg Intravenous Q8H  . insulin aspart  0-15 Units Subcutaneous 6 times per day  . metolazone  5 mg Oral Daily  . metoprolol tartrate  25 mg Per Tube 4 times per day  . QUEtiapine  25 mg Oral BID  . ticagrelor  90 mg Per Tube BID  . vancomycin  1,250 mg Intravenous Q12H  . vancomycin  1,500 mg Intravenous Once    ROS: Unobtainable at this time  Physical Examination: Blood pressure 163/90, pulse 113, temperature 100.6 F (38.1 C), temperature source Core (Comment), resp. rate 44, height 5\' 7"  (1.702 m), weight 93.21 kg (205 lb 7.9 oz), SpO2 86 %.   General - 49 year old male intubated, restless, and restrained. Eyes: noninjected sclera ENT: ET tube in place Heart - Regular rate and rhythm - no murmer appreciated Lungs - Clear to auscultation anteriorly Abdomen - Soft - non tender Extremities - No edema, mottling of feet Skin - Warm and dry   Neurologic Examination Neurologic Exam:  MENTAL STATUS: Intubated and sedated. Restless with  both arms restrained. He follows commands to close eyes and squeeze hands.  CRANIAL NERVES: pupils equal and reactive to light, does nto look to either side, too awake for doll's no facial asymmetry noted, Corneal reflex present, Gag reflex - intact MOTOR: The patient moves all extremities. SENSORY: responds to nox stim x 4.  COORDINATION: Could not be tested REFLEXES: deep tendon reflexes diminished in all areas - no babinski GAIT/STATION: Deferred   Laboratory Studies:   Basic Metabolic Panel:  Recent Labs Lab 01/14/15 0441 01/15/15 0500  01/16/15 0228 01/17/15 0435 01/18/15 0430  NA 147* 150* 150* 151* 148*  K 3.7 3.4* 3.7 3.6 3.7  CL 111 114* 116* 114* 112*  CO2 GLUCOSE 136* 117* 141* 104* 116*  BUN 78* 58* 54* 54* 46*  CREATININE 2.43* 1.58* 1.43* 1.56* 1.54*  CALCIUM 8.0* 8.3* 8.1* 8.3* 7.9*  MG  --   --   --  2.4 2.3  PHOS  --   --   --  3.5 3.8    Liver Function Tests:  Recent Labs Lab 01/13/15 0415 01/13/15 1100 01/14/15 0441 01/15/15 0500 01/16/15 0228  AST 137* 126* 121* 84* 75*  ALT 255* 223* 216* 173* 144*  ALKPHOS 47 47 57 69 89  BILITOT 2.4* 2.4* 2.9* 3.0* 3.2*  PROT 5.3* 5.1* 5.5* 5.4* 5.5*  ALBUMIN 2.3* 2.1* 2.2* 2.2* 2.1*   No results for input(s): LIPASE, AMYLASE in the last 168 hours. No results for input(s): AMMONIA in the last 168 hours.  CBC:  Recent Labs Lab 01/14/15 0441 01/15/15 0500 01/16/15 0228 01/17/15 0435 01/18/15 0430  WBC 8.0 10.5 13.1* 12.1* 13.9*  HGB 8.0* 8.6* 8.4* 7.7* 7.3*  HCT 24.2* 26.0* 25.7* 24.2* 23.0*  MCV 84.3 84.1 85.7 88.0 87.5  PLT 125* 136* 130* 115* 160    Cardiac Enzymes: No results for input(s): CKTOTAL, CKMB, CKMBINDEX, TROPONINI in the last 168 hours.  BNP: Invalid input(s): POCBNP  CBG:  Recent Labs Lab 01/17/15 1232 01/17/15 1643 01/17/15 1942 01/18/15 0011 01/18/15 0419  GLUCAP 124* 133* 101* 117* 111*    Microbiology: Results for orders placed or performed during the hospital encounter of 01/08/15  MRSA PCR Screening     Status: None   Collection Time: 01/09/15  9:45 AM  Result Value Ref Range Status   MRSA by PCR NEGATIVE NEGATIVE Final    Comment:        The GeneXpert MRSA Assay (FDA approved for NASAL specimens only), is one component of a comprehensive MRSA colonization surveillance program. It is not intended to diagnose MRSA infection nor to guide or monitor treatment for MRSA infections.   Culture, respiratory (NON-Expectorated)     Status: None   Collection Time: 01/11/15 11:44 AM   Result Value Ref Range Status   Specimen Description TRACHEAL ASPIRATE  Final   Special Requests NONE  Final   Gram Stain   Final    FEW WBC PRESENT,BOTH PMN AND MONONUCLEAR RARE SQUAMOUS EPITHELIAL CELLS PRESENT NO ORGANISMS SEEN Performed at [facility]    Culture   Final    ABUNDANT STAPHYLOCOCCUS AUREUS Note: RIFAMPIN AND GENTAMICIN SHOULD NOT BE USED AS SINGLE DRUGS FOR TREATMENT OF STAPH INFECTIONS. Performed at [facility]    Report Status 01/14/2015 FINAL  Final   Organism ID, Bacteria STAPHYLOCOCCUS AUREUS  Final      Susceptibility   Staphylococcus aureus - MIC*    CLINDAMYCIN <=0.25 SENSITIVE Sensitive  ERYTHROMYCIN <=0.25 SENSITIVE Sensitive     GENTAMICIN <=0.5 SENSITIVE Sensitive     LEVOFLOXACIN <=0.12 SENSITIVE Sensitive     OXACILLIN 0.5 SENSITIVE Sensitive     PENICILLIN >=0.5 RESISTANT Resistant     RIFAMPIN <=0.5 SENSITIVE Sensitive     TRIMETH/SULFA <=10 SENSITIVE Sensitive     VANCOMYCIN 1 SENSITIVE Sensitive     TETRACYCLINE 2 SENSITIVE Sensitive     MOXIFLOXACIN <=0.25 SENSITIVE Sensitive     * ABUNDANT STAPHYLOCOCCUS AUREUS  Culture, blood (routine x 2)     Status: None (Preliminary result)   Collection Time: 01/13/15 12:13 AM  Result Value Ref Range Status   Specimen Description BLOOD RIGHT HAND  Final   Special Requests BOTTLES DRAWN AEROBIC ONLY 10CC  Final   Culture   Final           BLOOD CULTURE RECEIVED NO GROWTH TO DATE CULTURE WILL BE HELD FOR 5 DAYS BEFORE ISSUING A FINAL NEGATIVE REPORT Performed at [facility]    Report Status PENDING  Incomplete  Culture, blood (routine x 2)     Status: None (Preliminary result)   Collection Time: 01/13/15 12:22 AM  Result Value Ref Range Status   Specimen Description BLOOD LEFT ANTECUBITAL  Final   Special Requests BOTTLES DRAWN AEROBIC ONLY 10CC  Final   Culture   Final           BLOOD CULTURE RECEIVED NO GROWTH TO DATE CULTURE WILL BE HELD FOR 5 DAYS BEFORE ISSUING  A FINAL NEGATIVE REPORT Performed at [facility]    Report Status PENDING  Incomplete  Culture, respiratory (NON-Expectorated)     Status: None   Collection Time: 01/13/15 10:10 AM  Result Value Ref Range Status   Specimen Description TRACHEAL ASPIRATE  Final   Special Requests NONE  Final   Gram Stain   Final    MODERATE WBC PRESENT,BOTH PMN AND MONONUCLEAR RARE SQUAMOUS EPITHELIAL CELLS PRESENT NO ORGANISMS SEEN Performed at [facility]    Culture   Final    MODERATE STAPHYLOCOCCUS AUREUS Note: RIFAMPIN AND GENTAMICIN SHOULD NOT BE USED AS SINGLE DRUGS FOR TREATMENT OF STAPH INFECTIONS. Performed at [facility]    Report Status 01/15/2015 FINAL  Final   Organism ID, Bacteria STAPHYLOCOCCUS AUREUS  Final      Susceptibility   Staphylococcus aureus - MIC*    CLINDAMYCIN <=0.25 SENSITIVE Sensitive     ERYTHROMYCIN 0.5 SENSITIVE Sensitive     GENTAMICIN <=0.5 SENSITIVE Sensitive     LEVOFLOXACIN <=0.12 SENSITIVE Sensitive     OXACILLIN 0.5 SENSITIVE Sensitive     PENICILLIN >=0.5 RESISTANT Resistant     RIFAMPIN <=0.5 SENSITIVE Sensitive     TRIMETH/SULFA <=10 SENSITIVE Sensitive     VANCOMYCIN 1 SENSITIVE Sensitive     TETRACYCLINE <=1 SENSITIVE Sensitive     MOXIFLOXACIN <=0.25 SENSITIVE Sensitive     * MODERATE STAPHYLOCOCCUS AUREUS    Coagulation Studies: No results for input(s): LABPROT, INR in the last 72 hours.  Urinalysis: No results for input(s): COLORURINE, LABSPEC, PHURINE, GLUCOSEU, HGBUR, BILIRUBINUR, KETONESUR, PROTEINUR, UROBILINOGEN, NITRITE, LEUKOCYTESUR in the last 168 hours.  Invalid input(s): APPERANCEUR  Lipid Panel:     Component Value Date/Time   TRIG 210* 01/16/2015 0228    HgbA1C: No results found for: HGBA1C  Urine Drug Screen:  No results found for: LABOPIA, COCAINSCRNUR, LABBENZ, AMPHETMU, THCU, LABBARB  Alcohol Level: No results for input(s): ETH in the last 168 hours.  Other  results: EKG: Sinus tachycarfia  rate 102 - STEMI  Imaging:  Dg Chest Port 1 View 01/18/2015    Tube and catheter positions as described without pneumothorax. Moderate interstitial edema, stable. No new opacity. No change in cardiac silhouette.       Dg Chest Port 1 View 01/17/2015    1.  Stable lines and tubes.  2. Widespread coarse pulmonary opacity with slowly improving bilateral ventilation since 01/12/2015. No new cardiopulmonary     abnormality.      CT Head Wo Contrast 01/10/2015 No acute intracranial abnormality.  EEG 01/09/2015 Abnormal EEG due to the presence of a generalized slowing indicating a mild to moderate cerebral disturbance (encephalopathy). No epileptiform activity noted.     EEG 01/18/2015 Pending    Assessment/Plan:  49 year old male status post cardiac arrest with ST elevation MI and subsequent intervention. Head CT unremarkable as noted above. Neurology was asked to evaluate the patient for anoxic encephalopathy. The patient was not able to follow any commands during this exam although the patient's nurse reports that he has, at times, followed commands for her. Repeat EEG is pending. Dr. Amada Jupiter to see patient.  Delton See PA-C [facility] Pager [phone] 01/18/2015, 10:36 AM  I have seen and evaluated the patient. I have reviewed the above note and made appropriate changes.   49 yo M with likely anoxic brain injury with significant chance of recovery. An MRI may help to quantify injury, but with him already following commands and his young age I would favor continued aggressive care.   1) MRI brain 2) EEG  Ritta Slot, MD [facility] [phone]  If 7pm- 7am, please page neurology on call as listed in AMION.

## 2015-01-18 NOTE — Procedures (Signed)
ELECTROENCEPHALOGRAM REPORT  Patient: Alexander Berry       Room #: 0D32 EEG No. ID: [id number] Age: 49 y.o.        Sex: male Referring Physician: Sung Amabile, D Report Date:  01/18/2015        Interpreting Physician: Aline Brochure  History: Dezmund Velilla is an 49 y.o. male admitted with acute STEMI following collapse with ventricular tachycardia and CPR/defibrillation. Patient has remained intubated and on mechanical ventilation, but has been responsive to external stimuli including verbal commands.  Indications for study:  Assess severity of encephalopathy; rule out seizure activity.  Technique: This is an 18 channel routine scalp EEG performed at the bedside with bipolar and monopolar montages arranged in accordance to the international 10/20 system of electrode placement.   Description: Patient was moderately agitated at times during this EEG recording. He was on sedating medication, including propofol and fentanyl. Predominant background activity consisted of low amplitude diffuse mixed irregular delta and theta activity. There were occasional brief occurrences of moderate amplitude somewhat rhythmic delta activity diffusely. Photic stimulation was not performed. No epileptiform discharges were recorded.  Interpretation: This EEG recording was abnormal with moderate generalized nonspecific continuous slowing of cerebral activity which may in part be secondary to sedating medications. Mild hypoxic encephalopathy cannot be ruled out at this point. No evidence of epileptic activity was demonstrated.   Venetia Maxon M.D. [facility] [phone]

## 2015-01-19 ENCOUNTER — Inpatient Hospital Stay (HOSPITAL_COMMUNITY): Payer: Medicaid Other

## 2015-01-19 DIAGNOSIS — D649 Anemia, unspecified: Secondary | ICD-10-CM

## 2015-01-19 DIAGNOSIS — G931 Anoxic brain damage, not elsewhere classified: Secondary | ICD-10-CM

## 2015-01-19 LAB — CBC
HCT: 22.3 % — ABNORMAL LOW (ref 39.0–52.0)
Hemoglobin: 7.1 g/dL — ABNORMAL LOW (ref 13.0–17.0)
MCH: 27.7 pg (ref 26.0–34.0)
MCHC: 31.8 g/dL (ref 30.0–36.0)
MCV: 87.1 fL (ref 78.0–100.0)
Platelets: 207 10*3/uL (ref 150–400)
RBC: 2.56 MIL/uL — ABNORMAL LOW (ref 4.22–5.81)
RDW: 17.8 % — ABNORMAL HIGH (ref 11.5–15.5)
WBC: 15.3 10*3/uL — ABNORMAL HIGH (ref 4.0–10.5)

## 2015-01-19 LAB — PREPARE RBC (CROSSMATCH)

## 2015-01-19 LAB — POCT I-STAT 3, ART BLOOD GAS (G3+)
ACID-BASE EXCESS: 11 mmol/L — AB (ref 0.0–2.0)
BICARBONATE: 35.4 meq/L — AB (ref 20.0–24.0)
O2 Saturation: 98 %
PH ART: 7.5 — AB (ref 7.350–7.450)
PO2 ART: 109 mmHg — AB (ref 80.0–100.0)
Patient temperature: 100
TCO2: 37 mmol/L (ref 0–100)
pCO2 arterial: 45.6 mmHg — ABNORMAL HIGH (ref 35.0–45.0)

## 2015-01-19 LAB — BASIC METABOLIC PANEL
Anion gap: 12 (ref 5–15)
BUN: 43 mg/dL — ABNORMAL HIGH (ref 6–20)
CALCIUM: 7.9 mg/dL — AB (ref 8.9–10.3)
CO2: 30 mmol/L (ref 22–32)
Chloride: 101 mmol/L (ref 101–111)
Creatinine, Ser: 1.29 mg/dL — ABNORMAL HIGH (ref 0.61–1.24)
Glucose, Bld: 110 mg/dL — ABNORMAL HIGH (ref 65–99)
Potassium: 3.6 mmol/L (ref 3.5–5.1)
SODIUM: 143 mmol/L (ref 135–145)

## 2015-01-19 LAB — MAGNESIUM: MAGNESIUM: 2.1 mg/dL (ref 1.7–2.4)

## 2015-01-19 LAB — CULTURE, BLOOD (ROUTINE X 2)
CULTURE: NO GROWTH
Culture: NO GROWTH

## 2015-01-19 LAB — GLUCOSE, CAPILLARY
GLUCOSE-CAPILLARY: 109 mg/dL — AB (ref 65–99)
Glucose-Capillary: 116 mg/dL — ABNORMAL HIGH (ref 65–99)
Glucose-Capillary: 102 mg/dL — ABNORMAL HIGH (ref 65–99)
Glucose-Capillary: 95 mg/dL (ref 65–99)
Glucose-Capillary: 98 mg/dL (ref 65–99)

## 2015-01-19 LAB — PHOSPHORUS: PHOSPHORUS: 5.1 mg/dL — AB (ref 2.5–4.6)

## 2015-01-19 LAB — ABO/RH: ABO/RH(D): A POS

## 2015-01-19 LAB — TRIGLYCERIDES: Triglycerides: 263 mg/dL — ABNORMAL HIGH (ref <150)

## 2015-01-19 MED ORDER — AMLODIPINE BESYLATE 10 MG PO TABS
10.0000 mg | ORAL_TABLET | Freq: Every day | ORAL | Status: DC
  Administered 2015-01-20 – 2015-01-22 (×3): 10 mg via ORAL
  Filled 2015-01-19 (×4): qty 1

## 2015-01-19 MED ORDER — CARVEDILOL 12.5 MG PO TABS
12.5000 mg | ORAL_TABLET | Freq: Two times a day (BID) | ORAL | Status: DC
  Administered 2015-01-19 – 2015-01-20 (×2): 12.5 mg via ORAL
  Filled 2015-01-19 (×5): qty 1

## 2015-01-19 MED ORDER — AMLODIPINE 1 MG/ML ORAL SUSPENSION: 10.0000 mg | Freq: Every day | ORAL | Status: DC

## 2015-01-19 MED ORDER — SODIUM CHLORIDE 0.9 % IV SOLN: Freq: Once | INTRAVENOUS | Status: AC

## 2015-01-19 MED ORDER — POTASSIUM CHLORIDE 10 MEQ/50ML IV SOLN
10.0000 meq | INTRAVENOUS | Status: AC
  Administered 2015-01-19 (×2): 10 meq via INTRAVENOUS
  Filled 2015-01-19 (×2): qty 50

## 2015-01-19 MED ORDER — ACETAMINOPHEN 650 MG RE SUPP
650.0000 mg | RECTAL | Status: DC | PRN
  Administered 2015-01-19: 650 mg via RECTAL
  Filled 2015-01-19: qty 1

## 2015-01-19 NOTE — Progress Notes (Signed)
[facility] ADULT ICU REPLACEMENT PROTOCOL FOR AM LAB REPLACEMENT ONLY  The patient does apply for the [facility] Adult ICU Electrolyte Replacment Protocol based on the criteria listed below:   1. Is GFR >/= 40 ml/min? Yes.    Patient's GFR today is >60 2. Is urine output >/= 0.5 ml/kg/hr for the last 6 hours? Yes.   Patient's UOP is 64ml/kg/hr 3. Is BUN < 60 mg/dL? Yes.    Patient's BUN today is 43 4. Abnormal electrolyte(s): K3.6 5. Ordered repletion with: per protocol 6. If a panic level lab has been reported, has the CCM MD in charge been notified? Yes.  .   Physician:  D Sharmaine Base 01/19/2015 5:45 AM

## 2015-01-19 NOTE — Procedures (Signed)
Extubation Procedure Note  Patient Details:   Name: Alexander Berry DOB: [birth year] MRN: [id number]   Airway Documentation:     Evaluation  O2 sats: stable throughout Complications: No apparent complications Patient did tolerate procedure well. Bilateral Breath Sounds: Clear Suctioning: Airway Yes    Patient was extubated to 4 Lpm by nasal cannula per MD order.  A cuff leaked was noted.  No evidence of strider.  Patient able to speak. Vitals are stable. Will continue to monitor.   Forest Becker Aliene Tamura 01/19/2015, 10:24 AM

## 2015-01-19 NOTE — Progress Notes (Signed)
200 cc Fentanyl wasted by myself and [place] Katiria Calame, RN

## 2015-01-19 NOTE — Progress Notes (Signed)
RN noticed a change in color of the subglottic suction appearance. It was more milky white and a greater amount of fluids were being suctioned. RN called in charge nurse, [place], and with auscultation, found the NG tube to be in the pt's mouth. NG tube removed and tube feedings stopped.

## 2015-01-19 NOTE — Progress Notes (Signed)
Subjective: No acute events overnight.   Exam: Filed Vitals:   01/19/15 0855  BP:   Pulse:   Temp: 100.2 F (37.9 C)  Resp: 21   Gen: In bed, NAD MS: Awake, follows commands.  FG:HWEXH, does not look to the left, nor completely to the right.  Motor: Moves all extremiteis spontaneously and to command.  Sensory:responds to nox stim x 4.   EEG shows no epileptiform activity and MRI is normal.    On my review, I think there is questionable cortical ribboning on the left temporal region though this certainly could be artifact.   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 I would call this mild to moderate and feel that he has a very good chance at recovery. I would not limit care due to these mild deficits given his chance at recovery.   Recommendations: 1)Neurology to sign off. Please call with any further questions or concerns.  Ritta Slot, MD [facility] [phone]  If 7pm- 7am, please page neurology on call as listed in AMION.

## 2015-01-19 NOTE — Progress Notes (Signed)
Called Elink MD. PT breathing 40-45 time a minute after being switched from wean to support. Pt coughing and flailing arms. BP 183/80 (105). Scheduled to go to MRI at midnight, but pt not able to hold still enough. MD switched from precedex to propofol. Started on 20 of propofol.   At 0000 pt's BP was 89/42. Laid pt trendelenburg. MRI to wait until pt's BP stabilizes and pt is calm.  At 0030 pt's HOB is 30 and BP is 130/46 (66). MRI to be schedule for later in am.

## 2015-01-19 NOTE — Progress Notes (Signed)
PULMONARY / CRITICAL CARE MEDICINE   Name: Alexander Berry MRN: [id number] DOB: 1966-06-03    ADMISSION DATE:  01/08/2015 CONSULTATION DATE:  01/08/2015    PT PROFILE:   VF arrest while playing soccer 6/05. Prolonged ACLS. DES to RCA. Hypothermia protocol  MAJOR EVENTS/TEST RESULTS: 6/05 Admitted after OOH arrest 6/05 LHC: Dist RCA lesion, 80% stenosed. Mid RCA lesion, 100% stenosed. There is a 0% residual stenosis post intervention. A drug-eluting stent was placed. 2nd RPLB-1 lesion, 100% stenosed. 2nd RPLB-2 lesion, 100% stenosed. There is a 0% residual stenosis post intervention. RPDA lesion, 80% stenosed. There is a 0% residual stenosis post intervention.  6/06 EEG: Abnormal EEG due to the presence of a generalized slowing indicating a mild to moderate cerebral disturbance (encephalopathy). No epileptiform activity noted 6/06 TTE: The estimated ejection fraction was in the range of 55% to 60%. Mild posterior wall hypokinesis - decreased Definity microsphere uptake in the posterior wall suggests hypoperfusion  6/07 Rewarmed. Continuous sedatives stopped. RASS -5. No spont movement 6/07 CT head: NAD 6/07 repeat EEG: Abnormal EEG due to the presence of a generalized slowing indicating a mild to moderate cerebral disturbance (encephalopathy). No epileptiform activity noted 6/08 Increased agitation requiring sedation. Not F/C. Increased O2 reqts. Frothy pink sputum. Resp culture obatined. PCT elevated. Empiric abx initiated. CXR c/w increased pulm edema. Hypertensive. Furosemide ordered 6/08 repeat limited TTE: LVEF 50%. Inferior wall AK 6/09 Intermittently F/C. RASS -2 to +1. Requiring 70% O2. Dexmedetomidine initiated 6/09 PM: self extubated, required immediate re-intubation. High fevers - cooling blanket initiated. Hypotension - NE initiated 6/10 High fevers persist. Arctic sun device re-ordered to maintain T < 99.5. PCT remained elevated. Abx adjusted. Cr rising. New dx of severe  sepsis/septic shock rendered. Elevated bilirubin  6/10 RUQ [place]: biliary sludge. No obstruction 6/11 Off vasopressors. Fevers controlled. Cr improving. No WUA due to increasing agitation 6/12 ARDS persists. AKI improving. Worsening hypernatremia - D5W initiated  INDWELLING DEVICES: Femoral sheath 6/05 >> 6/07 ETT 6/05 >>  R IJ CVL 6/05 >>   MICRO DATA: Resp 6/08 >> MSSA resp 6/10 >> MSSA  Blood 6/10 >>   PCT 6/08: 7.46           6/09: 8.90          6/10: 9.80          6/12: 5.15  ANTIMICROBIALS:  Vanc 6/08 >> stopped at one point then restarted 6/15>>> Linezolid 6/10 >> 6/11  Ciprofloxacin 6/10 >> 6/12 Ceftazidime 6/08 >> 6/12>>>6/15>>> Ceftriaxone 6/12 >> 6/15  SUBJ: No longer paralyzed, moving following commands.  Very agitated.  VITAL SIGNS: Temp:  [99.5 F (37.5 C)-102 F (38.9 C)] 99.5 F (37.5 C) (06/16 0800) Pulse Rate:  [79-113] 88 (06/16 0727) Resp:  [13-44] 14 (06/16 0800) BP: (117-168)/(54-90) 117/57 mmHg (06/16 0727) SpO2:  [93 %-100 %] 100 % (06/16 0800) Arterial Line BP: (89-218)/(42-82) 95/74 mmHg (06/16 0800) FiO2 (%):  [40 %] 40 % (06/16 0727)  HEMODYNAMICS:   VENTILATOR SETTINGS: Vent Mode:  [-] PSV;CPAP FiO2 (%):  [40 %] 40 % Set Rate:  [26 bmp] 26 bmp Vt Set:  [470 mL] 470 mL PEEP:  [5 cmH20] 5 cmH20 Pressure Support:  [5 cmH20-10 cmH20] 5 cmH20 Plateau Pressure:  [17 cmH20-22 cmH20] 17 cmH20   INTAKE / OUTPUT:  Intake/Output Summary (Last 24 hours) at 01/19/15 0833 Last data filed at 01/19/15 4098  Gross per 24 hour  Intake 3836.31 ml  Output   5400 ml  Net -1563.69 ml  PHYSICAL EXAMINATION: General: RASS -1, moving all ext to commands but is very agitated. Neuro: Sedated, moving all ext spontaneously and to command, very agitated.  Not tracking however. HEENT: [place]/AT, PERRL, EOM-I and MMM. Cardiovascular:  RRR, Nl S1/S2, -M/R/G. Lungs: Coarse BS diffusely. Abdomen: Soft, NT, ND and +BS. Ext: Warm, no LE  edema  LABS:  PULMONARY  Recent Labs Lab 01/16/15 0442 01/16/15 0612 01/17/15 0418 01/18/15 0317 01/19/15 0320  PHART 7.231* 7.217* 7.435 7.459* 7.500*  PCO2ART 63.2* 64.7* 40.5 38.6 45.6*  PO2ART 80.0 95.1 124* 86.6 109.0*  HCO3 26.6* 25.3* 26.7* 27.0* 35.4*  TCO2 28 27.3 28.0 28.2 37  O2SAT 93.0 94.5 98.8 97.1 98.0   CBC  Recent Labs Lab 01/17/15 0435 01/18/15 0430 01/19/15 0330  HGB 7.7* 7.3* 7.1*  HCT 24.2* 23.0* 22.3*  WBC 12.1* 13.9* 15.3*  PLT 115* 160 207   COAGULATION No results for input(s): INR in the last 168 hours.  CARDIAC   No results for input(s): TROPONINI in the last 168 hours. No results for input(s): PROBNP in the last 168 hours.  CHEMISTRY  Recent Labs Lab 01/15/15 0500 01/16/15 0228 01/17/15 0435 01/18/15 0430 01/19/15 0330  NA 150* 150* 151* 148* 143  K 3.4* 3.7 3.6 3.7 3.6  CL 114* 116* 114* 112* 101  CO2 GLUCOSE 117* 141* 104* 116* 110*  BUN 58* 54* 54* 46* 43*  CREATININE 1.58* 1.43* 1.56* 1.54* 1.29*  CALCIUM 8.3* 8.1* 8.3* 7.9* 7.9*  MG  --   --  2.4 2.3 2.1  PHOS  --   --  3.5 3.8 5.1*   Estimated Creatinine Clearance: 77 mL/min (by C-G formula based on Cr of 1.29).  LIVER  Recent Labs Lab 01/13/15 0415 01/13/15 1100 01/14/15 0441 01/15/15 0500 01/16/15 0228  AST 137* 126* 121* 84* 75*  ALT 255* 223* 216* 173* 144*  ALKPHOS 47 47 57 69 89  BILITOT 2.4* 2.4* 2.9* 3.0* 3.2*  PROT 5.3* 5.1* 5.5* 5.4* 5.5*  ALBUMIN 2.3* 2.1* 2.2* 2.2* 2.1*   INFECTIOUS  Recent Labs Lab 01/13/15 0415 01/15/15 0500  PROCALCITON 9.80 5.15   ENDOCRINE CBG (last 3)   Recent Labs  01/18/15 1940 01/18/15 2315 01/19/15 0329  GLUCAP 142* 116* 102*   CXR: NSC ARDS pattern  ASSESSMENT / PLAN: CARDIOVASCULAR A:  Acute STEMI 6/05 Out of hospital VF arrest 6/05 Cardiogenic shock, resolved Hypertension, controlled Mild sinus tachycardia Septic shock 6/09, resolved 6/11 P:  Cards following. MAP goal  65 mmHg. Change lopressor to coreg 12.5 mg PO BID. Increase norvasc to 10 mg PO daily with holding parameters.  PULMONARY A:  VDRF post arrest 6/05 Pulm edema 6/06 Staph PNA 6/08 ARDS 6/09 P:   Continue PS but no extubation until mental status improves. Cont vent bundle. Attempt to keep off nimbex. Lasix as below. Daily CXR for now.  RENAL A:  AKI, nonoliguric 6/05 ? CKD (Cr 2.15 on admission) Hypokalemia, recurrent Hypernatremia - worsening  Suspect post ATN diuresis P:   Monitor BMET intermittently. Monitor I/Os. Correct electrolytes as indicated. KVO IVF. D/C further diureses. PO K.  Continue free water to 300 ml q6 hours KVO IVF.  GASTROINTESTINAL A:   Shock liver - improving transaminases Hyperbilirubinemia Biliary sludge by RUQ [place] 6/10 P:   SUP: enteral famotidine Cont TFs Monitor LFTs intermittently  HEMATOLOGIC A:   Anemia without overt blood loss Mild thrombocytopenia P:  DVT px: Brilinta, SCDs. Monitor CBC intermittently. Transfuse per  usual guidelines.  INFECTIOUS A:   Elevated PCT MSSA PNA Fever P:   Monitor temp, WBC count. Micro and abx as above.  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 P:   RASS goal: -1, -2. Precedex only. D/C propofol. PRN fentanyl. Seroquel 25 BID but will no increase further in concern for QT interval. Daily WUA. Keep off nimbex. Neurology consult appreciated.  FAMILY  Family updated in detail 6/16.  The patient is critically ill with multiple organ systems failure and requires high complexity decision making for assessment and support, frequent evaluation and titration of therapies, application of advanced monitoring technologies and extensive  interpretation of multiple databases.   Critical Care Time devoted to patient care services described in this note is  35  Minutes. This time reflects time of care of this signee Dr Koren Bound. This critical care time does not reflect procedure time, or teaching time or supervisory time of PA/NP/Med student/Med Resident etc but could involve care discussion time.  Alyson Reedy, M.D. [facility] Pulmonary/Critical Care Medicine. Pager: [phone]. After hours pager: [phone].

## 2015-01-19 NOTE — Progress Notes (Signed)
Subjective:  Agitated moving on vent  Objective:   Vital Signs : Filed Vitals:   01/19/15 0700 01/19/15 0727 01/19/15 0800 01/19/15 0855  BP:  117/57    Pulse:  88    Temp: 100 F (37.8 C)  99.5 F (37.5 C) 100.2 F (37.9 C)  TempSrc:   Core (Comment)   Resp: _0 Height:      Weight:      SpO2: 100%  100% 98%    Intake/Output from previous day:  Intake/Output Summary (Last 24 hours) at 01/19/15 0911 Last data filed at 01/19/15 0800  Gross per 24 hour  Intake 3663.9 ml  Output   5400 ml  Net -1736.1 ml    I/O since admission: - 2629  Wt Readings from Last 3 Encounters:  01/14/15 205 lb 7.9 oz (93.21 kg)    Medications: . sodium chloride   Intravenous Once  . amLODipine  10 mg Oral Daily  . antiseptic oral rinse  7 mL Mouth Rinse QID  . aspirin  81 mg Per Tube Daily  . carvedilol  12.5 mg Oral BID WC  . cefTAZidime (FORTAZ)  IV  2 g Intravenous Q8H  . chlorhexidine  15 mL Mouth Rinse BID  . famotidine  20 mg Per Tube Daily  . feeding supplement (PRO-STAT SUGAR FREE 64)  30 mL Per Tube TID  . free water  300 mL Per Tube Q6H  . insulin aspart  0-15 Units Subcutaneous 6 times per day  . QUEtiapine  25 mg Oral BID  . ticagrelor  90 mg Per Tube BID  . vancomycin  1,250 mg Intravenous Q12H    . sodium chloride Stopped (01/17/15 1300)  . cisatracurium (NIMBEX) infusion Stopped (01/17/15 1115)  . dexmedetomidine Stopped (01/18/15 2356)  . feeding supplement (VITAL HIGH PROTEIN) Stopped (01/19/15 0100)    Physical Exam:   General appearance: sedated on vent Neck: no adenopathy, no carotid bruit, no JVD, supple, symmetrical, trachea midline and thyroid not enlarged, symmetric, no tenderness/mass/nodules Lungs: no wheezing Heart: regular rate and rhythm and no s3 Abdomen: soft, non-tender; BS + but diminished; no masses,  no organomegaly Extremities: no edema, redness or tenderness in the calves or thighs Skin: Skin color, texture, turgor normal. No  rashes or lesions Neurologic: no focal defects   Rate: 115  Rhythm: ST  ECG (independently read by me): from 01/11/15:  ST at 102 with evolving STT changes inferolaterally from MI but with preserved inferior R waves suggesting myocardial salvage.  ECG (independently read by me): from 01/17/15:  NSR at 90; inferior posterior infarct  With Q waves inferiorly but preserved R waves suggesting myocardial salvage with early transiton due to posterior wall involvement. Lab Results:   Recent Labs  01/17/15 0435 01/18/15 0430 01/19/15 0330  NA 151* 148* 143  K 3.6 3.7 3.6  CL 114* 112* 101  CO2 _1 GLUCOSE 104* 116* 110*  BUN 54* 46* 43*  CREATININE 1.56* 1.54* 1.29*  CALCIUM 8.3* 7.9* 7.9*  MG 2.4 2.3 2.1  PHOS 3.5 3.8 5.1*   Hepatic Function Latest Ref Rng 01/16/2015 01/15/2015 01/14/2015  Total Protein 6.5 - 8.1 g/dL 5.5(L) 5.4(L) 5.5(L)  Albumin 3.5 - 5.0 g/dL 2.1(L) 2.2(L) 2.2(L)  AST 15 - 41 U/L 75(H) 84(H) 121(H)  ALT 17 - 63 U/L 144(H) 173(H) 216(H)  Alk Phosphatase 38 - 126 U/L 89 69 57  Total Bilirubin 0.3 - 1.2 mg/dL 3.2(H) 3.0(H) 2.9(H)  Bilirubin, Direct 0.1 - 0.5 mg/dL - - 0.9(H)     Recent Labs  01/17/15 0435 01/18/15 0430 01/19/15 0330  WBC 12.1* 13.9* 15.3*  HGB 7.7* 7.3* 7.1*  HCT 24.2* 23.0* 22.3*  MCV 88.0 87.5 87.1  PLT 115* 160 207   BNP (last 3 results) No results for input(s): BNP in the last 8760 hours.  ProBNP (last 3 results) No results for input(s): PROBNP in the last 8760 hours.   No results for input(s): TROPONINI in the last 72 hours.  Invalid input(s): CK, MB  No results found for: TSH  No results for input(s): PROT, ALBUMIN, AST, ALT, ALKPHOS, BILITOT, BILIDIR, IBILI in the last 72 hours. No results for input(s): INR in the last 72 hours. BNP (last 3 results) No results for input(s): BNP in the last 8760 hours.  ProBNP (last 3 results) No results for input(s): PROBNP in the last 8760 hours.   Lipid Panel     Component  Value Date/Time   TRIG 263* 01/19/2015 0130     No results for input(s): HGBA1C in the last 72 hours.   Imaging:  Mr Herby Abraham Contrast  01/19/2015   CLINICAL DATA:  Cardiac arrest on soccer field, ventricular fibrillation. Evaluate anoxic brain injury. History of hyperglycemia, myocardial infarction, acute kidney injury.  EXAM: MRI HEAD WITHOUT CONTRAST  TECHNIQUE: Multiplanar, multiecho pulse sequences of the brain and surrounding structures were obtained without intravenous contrast.  COMPARISON:  CT head January 10, 2015  FINDINGS: The ventricles and sulci are normal for patient's age. No abnormal parenchymal signal, mass lesions, mass effect. No reduced diffusion to suggest acute ischemia. No susceptibility artifact to suggest hemorrhage.  No abnormal extra-axial fluid collections. No extra-axial masses though, contrast enhanced sequences would be more sensitive. Normal major intracranial vascular flow voids seen at the skull base.  Ocular globes and orbital contents are unremarkable though not tailored for evaluation. No abnormal sellar expansion. No suspicious calvarial bone marrow signal. No abnormal sellar expansion. Craniocervical junction maintained. Small bilateral mastoid effusions. Paranasal sinus mucosal thickening with sphenoid sinus air-fluid levels likely secondary to life-support lines  IMPRESSION: Normal noncontrast MRI of the brain; no MR findings of anoxic brain injury/ hypoxic ischemic injury.   Electronically Signed   By: Elon Alas M.D.   On: 01/19/2015 05:42   Dg Chest Port 1 View  01/19/2015   CLINICAL DATA:  Hypoxia.  Status post cardiac arrest  EXAM: PORTABLE CHEST - 1 VIEW  COMPARISON:  January 18, 2015  FINDINGS: Endotracheal tube tip is 2.8 cm above the carina. Nasogastric tube tip and side port are in the stomach. Central catheter tip is in the superior vena cava. No pneumothorax. Moderate interstitial edema remains bilaterally, stable. There is no new opacity. Heart is  upper normal in size with pulmonary vascular within normal limits, stable. No adenopathy.  IMPRESSION: Tube and catheter positions as described without pneumothorax. Moderate generalized interstitial edema, stable. No new opacity. Suspect pulmonary edema, possibly noncardiogenic given the clinical history. There may be a degree of underlying ARDS or possible aspiration pneumonitis.   Electronically Signed   By: Lowella Grip III M.D.   On: 01/19/2015 08:20   Dg Chest Port 1 View  01/18/2015   CLINICAL DATA:  Hypoxia  EXAM: PORTABLE CHEST - 1 VIEW  COMPARISON:  January 17, 2015  FINDINGS: Endotracheal tube tip is 2.0 cm above the carina. Nasogastric tube tip and side port are below the diaphragm. Central catheter tip is in the  superior vena cava. No pneumothorax. There is moderate interstitial edema bilaterally, stable. There is no appreciable airspace consolidation. Heart is upper normal in size with pulmonary vascular within normal limits. No adenopathy.  IMPRESSION: Tube and catheter positions as described without pneumothorax. Moderate interstitial edema, stable. No new opacity. No change in cardiac silhouette.   Electronically Signed   By: Lowella Grip III M.D.   On: 01/18/2015 07:15   Dg Abd Portable 1v  01/19/2015   CLINICAL DATA:  OG tube placement.  EXAM: PORTABLE ABDOMEN - 1 VIEW  COMPARISON:  01/08/2015  FINDINGS: Enteric tube tip is in the left upper quadrant consistent with location in the body of the stomach. Visualized bowel gas pattern is normal.  IMPRESSION: Enteric tube tip is in the left upper quadrant consistent with location in the body of the stomach.   Electronically Signed   By: Lucienne Capers M.D.   On: 01/19/2015 06:53      Assessment/Plan:   Active Problems:   Cardiac arrest   Acute ST elevation myocardial infarction (STEMI) involving right coronary artery   Acute respiratory failure with hypoxia   Hypokalemia   Acute pulmonary edema   Hyperglycemia    Encephalopathy acute   Pneumonia, organism unspecified   Encounter for intubation   Septic shock   ARDS (adult respiratory distress syndrome)   Thrombocytopenia   Staphylococcal pneumonia   AKI (acute kidney injury)   Anoxic brain injury  1. Day 11 s/p  Out of hospital VF cardiac arrest/Inferior STEMI s/p emergent PCI by me and PCI/DES to mid RCA occlusion with  very large dominant RCA with extensive thrombus burden and distal embolization with PTCA and opening of 2 distal branches. Myocardial salvage suggested by ECG and echo.  EF 50% by echo 2. Respiratory failure; re-intubated after self-extubation on 6/9 3. Sepsis/ARDS/Staph pneumonia: WBC increased to 15.3  today on fortaz and rocephin  4. Increase LFT: prob secondary to shock liver; improving 5.Renal insufficiency:  Cr 1.29, improved 6. Anemia: further decreased; 7.1/22.3 Would transfuse today with recent large MI 7. Hypernatremia: resolved at 143 today  On ASA/Brilinta for DAPT;  Continues with BP lability. Lopressor changed to carvedilol. Currently at 12.5 mg bid , will most likely need titration to 25 mg bid. Renal function improving; ultimately would start ACE-I or ARB once extubated if renal fxn remain stable. Transfuse PRBC today. No arrhythmia except sinus tachycardia.   Troy Sine, MD, [facility] 01/19/2015, 9:11 AM

## 2015-01-20 ENCOUNTER — Other Ambulatory Visit: Payer: Self-pay

## 2015-01-20 ENCOUNTER — Inpatient Hospital Stay (HOSPITAL_COMMUNITY): Payer: Medicaid Other

## 2015-01-20 DIAGNOSIS — I70263 Atherosclerosis of native arteries of extremities with gangrene, bilateral legs: Secondary | ICD-10-CM

## 2015-01-20 LAB — POCT I-STAT 3, ART BLOOD GAS (G3+)
ACID-BASE EXCESS: 4 mmol/L — AB (ref 0.0–2.0)
BICARBONATE: 25.9 meq/L — AB (ref 20.0–24.0)
O2 Saturation: 96 %
PH ART: 7.555 — AB (ref 7.350–7.450)
TCO2: 27 mmol/L (ref 0–100)
pCO2 arterial: 29.5 mmHg — ABNORMAL LOW (ref 35.0–45.0)
pO2, Arterial: 71 mmHg — ABNORMAL LOW (ref 80.0–100.0)

## 2015-01-20 LAB — GLUCOSE, CAPILLARY
GLUCOSE-CAPILLARY: 112 mg/dL — AB (ref 65–99)
GLUCOSE-CAPILLARY: 104 mg/dL — AB (ref 65–99)
Glucose-Capillary: 119 mg/dL — ABNORMAL HIGH (ref 65–99)
Glucose-Capillary: 104 mg/dL — ABNORMAL HIGH (ref 65–99)
Glucose-Capillary: 117 mg/dL — ABNORMAL HIGH (ref 65–99)
Glucose-Capillary: 125 mg/dL — ABNORMAL HIGH (ref 65–99)
Glucose-Capillary: 114 mg/dL — ABNORMAL HIGH (ref 65–99)

## 2015-01-20 LAB — BASIC METABOLIC PANEL
Anion gap: 12 (ref 5–15)
BUN: 42 mg/dL — ABNORMAL HIGH (ref 6–20)
CO2: 27 mmol/L (ref 22–32)
Calcium: 8.6 mg/dL — ABNORMAL LOW (ref 8.9–10.3)
Chloride: 106 mmol/L (ref 101–111)
Creatinine, Ser: 1.3 mg/dL — ABNORMAL HIGH (ref 0.61–1.24)
GFR calc non Af Amer: >60 mL/min (ref >60)
Glucose, Bld: 115 mg/dL — ABNORMAL HIGH (ref 65–99)
Potassium: 3.6 mmol/L (ref 3.5–5.1)
Sodium: 145 mmol/L (ref 135–145)

## 2015-01-20 LAB — CBC
HEMATOCRIT: 29.5 % — AB (ref 39.0–52.0)
HEMOGLOBIN: 9.6 g/dL — AB (ref 13.0–17.0)
MCH: 28.3 pg (ref 26.0–34.0)
MCHC: 32.5 g/dL (ref 30.0–36.0)
MCV: 87 fL (ref 78.0–100.0)
Platelets: 391 10*3/uL (ref 150–400)
RBC: 3.39 MIL/uL — AB (ref 4.22–5.81)
RDW: 17.5 % — ABNORMAL HIGH (ref 11.5–15.5)
WBC: 20.1 10*3/uL — AB (ref 4.0–10.5)

## 2015-01-20 LAB — TYPE AND SCREEN
ABO/RH(D): A POS
ANTIBODY SCREEN: NEGATIVE
UNIT DIVISION: 0

## 2015-01-20 LAB — CK TOTAL AND CKMB (NOT AT ARMC)
CK, MB: 5.5 ng/mL — ABNORMAL HIGH (ref 0.5–5.0)
RELATIVE INDEX: 2.6 — AB (ref 0.0–2.5)
Total CK: 213 U/L (ref 49–397)

## 2015-01-20 LAB — PHOSPHORUS: Phosphorus: 3.8 mg/dL (ref 2.5–4.6)

## 2015-01-20 LAB — MAGNESIUM: MAGNESIUM: 2.3 mg/dL (ref 1.7–2.4)

## 2015-01-20 MED ORDER — CEFAZOLIN SODIUM-DEXTROSE 2-3 GM-% IV SOLR
2.0000 g | Freq: Three times a day (TID) | INTRAVENOUS | Status: DC
  Administered 2015-01-20 – 2015-01-23 (×9): 2 g via INTRAVENOUS
  Filled 2015-01-20 (×14): qty 50

## 2015-01-20 MED ORDER — CARVEDILOL 6.25 MG PO TABS
18.7500 mg | ORAL_TABLET | Freq: Two times a day (BID) | ORAL | Status: DC
  Administered 2015-01-20 – 2015-01-24 (×9): 18.75 mg via ORAL
  Filled 2015-01-20 (×12): qty 1

## 2015-01-20 MED ORDER — LISINOPRIL 2.5 MG PO TABS
2.5000 mg | ORAL_TABLET | Freq: Every day | ORAL | Status: DC
  Administered 2015-01-20 – 2015-01-21 (×2): 2.5 mg via ORAL
  Filled 2015-01-20 (×2): qty 1

## 2015-01-20 MED ORDER — FENTANYL CITRATE (PF) 100 MCG/2ML IJ SOLN
25.0000 ug | INTRAMUSCULAR | Status: DC | PRN
  Administered 2015-01-20 (×2): 25 ug via INTRAVENOUS
  Filled 2015-01-20: qty 2

## 2015-01-20 MED ORDER — FENTANYL CITRATE (PF) 100 MCG/2ML IJ SOLN
INTRAMUSCULAR | Status: AC
  Filled 2015-01-20: qty 2

## 2015-01-20 NOTE — Progress Notes (Signed)
Rehab Admissions Coordinator Note:  Patient was screened by Ivoree Felmlee L for appropriateness for an Inpatient Acute Rehab Consult.  At this time, we are recommending Inpatient Rehab consult.  Stpehanie Montroy L 01/20/2015, 12:53 PM  I can be reached at [phone].

## 2015-01-20 NOTE — Care Management Note (Signed)
Case Management Note  Patient Details  Name: Kyriakos Forgette MRN: [id number] Date of Birth: May 20, 1966  Subjective/Objective:     Cardiac Arrest          Action/Plan: Home Health  Expected Discharge Date:                  Expected Discharge Plan:  Home w Home Health Services  In-House Referral:     Discharge planning Services  CM Consult, Indigent Health Clinic, [facility] Program, Medication Assistance   Status of Service:  In process, will continue to follow  Medicare Important Message Given:    Date Medicare IM Given:    Medicare IM give by:    Date Additional Medicare IM Given:    Additional Medicare Important Message give by:     If discussed at Long Length of Stay Meetings, dates discussed:    Additional Comments: Chart reviewed. NCM spoke to dtr, Kaedan Diggles [phone], she translates for her parents. Pt lives at home with wife. He currently does not have any insurance. Explained to dtr the [facility] and assistance with medications at dc. NCM will continue to follow for dc needs. Elliot Cousin, RN 01/20/2015, 3:00 PM

## 2015-01-20 NOTE — Evaluation (Addendum)
Physical Therapy Evaluation Patient Details Name: Alexander Berry MRN: [id number] DOB: Dec 21, 1965 Today's Date: 01/20/2015   History of Present Illness  VF arrest while playing soccer 6/05. Prolonged ACLS. Hypothermia protocol ETT 6/5-6/9 self-extubated, reintubated 6/9-6/16. 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 Pt highly recommended for CIR to maximize recovery and decrease caregiver burden.     Follow Up Recommendations CIR;Supervision/Assistance - 24 hour    Equipment Recommendations  Rolling walker with 5" wheels;3in1 (PT)    Recommendations for Other Services       Precautions / Restrictions Precautions Precautions: Fall Precaution Comments: watch sats and BP, gangrenous toes Restrictions Weight Bearing Restrictions: No      Mobility  Bed Mobility Overal bed mobility: Needs Assistance Bed Mobility: Rolling;Sidelying to Sit Rolling: Mod assist Sidelying to sit: Max assist       General bed mobility comments: cues for sequence and use of rail but pt unable to grip rail with LUE to assist with rolling, sequential cues with assist to bring each leg off of the bed  Transfers Overall transfer level: Needs assistance   Transfers: Sit to/from Stand;Squat Pivot Transfers Sit to Stand: Mod assist;From elevated surface   Squat pivot transfers: Max assist;+2 physical assistance     General transfer comment: pt able to stand from elevated surface with Right knee blocked, use of belt and assist for anterior translation and balance. RN assisted with pivot to chair with belt, knee blocked and max cues.  Pt not advancing bil LE with transfers R>L  Ambulation/Gait                Stairs            Wheelchair Mobility    Modified Rankin (Stroke Patients Only)       Balance Overall balance assessment: Needs assistance Sitting-balance support: Feet supported Sitting balance-Leahy Scale: Poor Sitting balance - Comments: pt with varied right and left lean with anterior lean maintained min assist throughout EOB 8 min with cues to correct, pt unable to extend neck against gravity, unable to extend bil wrists to fully brace self in sitting                                     Pertinent Vitals/Pain Pain Assessment: 0-10 Pain Score: 5  Pain Location: bil feet Pain Descriptors / Indicators: Burning  HR 82-91 126/70 supine BP 109/54 in sitting sats 94% on 2L    Home Living Family/patient expects to be discharged to:: Private residence Living Arrangements: Spouse/significant other;Children Available Help at Discharge: Family;Available 24 hours/day Type of Home: House Home Access: Stairs to enter   [employer] of Steps: 3 Home Layout: One level Home Equipment: None      Prior Function Level of Independence: Independent               Hand Dominance        Extremity/Trunk Assessment   Upper Extremity Assessment: Generalized weakness (decreased intrinsic function and grip strength, general weakness not formally assessed)           Lower Extremity Assessment: Generalized weakness (pt with  grossly 2/5 strength bil LE )      Cervical / Trunk Assessment: Other exceptions  Communication   Communication: Prefers language other than [place];Other (comment) (dgtr present and interpreting)  Cognition Arousal/Alertness: Awake/alert Behavior During Therapy: WFL for tasks assessed/performed Overall Cognitive Status: Impaired/Different from baseline Area of Impairment: Orientation;Following commands;Safety/judgement;Problem  solving Orientation Level: Disoriented to;Time;Situation   Memory: Decreased short-term memory Following Commands: Follows one step commands with increased time;Follows one step commands inconsistently Safety/Judgement: Decreased awareness of safety;Decreased awareness of deficits   Problem Solving: Slow processing;Requires tactile cues;Requires verbal cues      General Comments      Exercises        Assessment/Plan    PT Assessment Patient needs continued PT services  PT Diagnosis Difficulty walking;Generalized weakness;Altered mental status   PT Problem List Decreased strength;Decreased cognition;Decreased range of motion;Decreased activity tolerance;Decreased balance;Decreased mobility;Decreased coordination;Impaired sensation;Cardiopulmonary status limiting activity;Pain;Decreased skin integrity;Decreased knowledge of use of DME;Decreased safety awareness  PT Treatment Interventions DME instruction;Gait training;Functional mobility training;Therapeutic activities;Therapeutic exercise;Balance training;Patient/family education;Cognitive remediation;Neuromuscular re-education   PT Goals (Current goals can be found in the Care Plan section) Acute Rehab PT Goals Patient Stated 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 PT Goal Formulation: With patient/family Time For Goal Achievement: 02/03/15 Potential to Achieve Goals: Good    Frequency Min 4X/week   Barriers to discharge Decreased caregiver support      Co-evaluation               End of Session Equipment Utilized During Treatment: Gait belt;Oxygen Activity Tolerance: Patient tolerated treatment well Patient left: in chair;with call bell/phone within reach;with chair alarm set;with nursing/sitter in room;with family/visitor present Nurse Communication: Mobility status;Need for lift equipment;Precautions         Time: 7494-4967 PT Time Calculation (min) (ACUTE ONLY): 32 min   Charges:   PT  Evaluation $Initial PT Evaluation Tier I: 1 Procedure PT Treatments $Therapeutic Activity: 8-22 mins   PT G Codes:        Delorse Lek 01/20/2015, 12:35 PM Delaney Meigs, PT [phone]

## 2015-01-20 NOTE — Progress Notes (Signed)
ANTIBIOTIC CONSULT NOTE - INITIAL  Pharmacy Consult for Cefazolin Indication: MSSA pneumonia  Not on File  Patient Measurements: Height: 5\' 7"  (170.2 cm) Weight: 205 lb 7.9 oz (93.21 kg) IBW/kg (Calculated) : 66.1  Vital Signs: Temp: 100.2 F (37.9 C) (06/17 0800) Temp Source: Rectal (06/17 0800) BP: 146/90 mmHg (06/17 0900) Intake/Output from previous day: 06/16 0701 - 06/17 0700 In: 1390.6 [I.V.:210.6; Blood:340; NG/GT:190; IV Piggyback:650] Out: 2225 [Urine:2225] Intake/Output from this shift: Total I/O In: -  Out: 450 [Urine:450]  Labs:  Recent Labs  01/18/15 0430 01/19/15 0330 01/20/15 0345  WBC 13.9* 15.3* 20.1*  HGB 7.3* 7.1* 9.6*  PLT 160 207 391  CREATININE 1.54* 1.29* 1.30*   Estimated Creatinine Clearance: 76.4 mL/min (by C-G formula based on Cr of 1.3). No results for input(s): VANCOTROUGH, VANCOPEAK, VANCORANDOM, GENTTROUGH, GENTPEAK, GENTRANDOM, TOBRATROUGH, TOBRAPEAK, TOBRARND, AMIKACINPEAK, AMIKACINTROU, AMIKACIN in the last 72 hours.   Microbiology: Recent Results (from the past 720 hour(s))  MRSA PCR Screening     Status: None   Collection Time: 01/09/15  9:45 AM  Result Value Ref Range Status   MRSA by PCR NEGATIVE NEGATIVE Final    Comment:        The GeneXpert MRSA Assay (FDA approved for NASAL specimens only), is one component of a comprehensive MRSA colonization surveillance program. It is not intended to diagnose MRSA infection nor to guide or monitor treatment for MRSA infections.   Culture, respiratory (NON-Expectorated)     Status: None   Collection Time: 01/11/15 11:44 AM  Result Value Ref Range Status   Specimen Description TRACHEAL ASPIRATE  Final   Special Requests NONE  Final   Gram Stain   Final    FEW WBC PRESENT,BOTH PMN AND MONONUCLEAR RARE SQUAMOUS EPITHELIAL CELLS PRESENT NO ORGANISMS SEEN Performed at [facility]    Culture   Final    ABUNDANT STAPHYLOCOCCUS AUREUS Note: RIFAMPIN AND GENTAMICIN  SHOULD NOT BE USED AS SINGLE DRUGS FOR TREATMENT OF STAPH INFECTIONS. Performed at [facility]    Report Status 01/14/2015 FINAL  Final   Organism ID, Bacteria STAPHYLOCOCCUS AUREUS  Final      Susceptibility   Staphylococcus aureus - MIC*    CLINDAMYCIN <=0.25 SENSITIVE Sensitive     ERYTHROMYCIN <=0.25 SENSITIVE Sensitive     GENTAMICIN <=0.5 SENSITIVE Sensitive     LEVOFLOXACIN <=0.12 SENSITIVE Sensitive     OXACILLIN 0.5 SENSITIVE Sensitive     PENICILLIN >=0.5 RESISTANT Resistant     RIFAMPIN <=0.5 SENSITIVE Sensitive     TRIMETH/SULFA <=10 SENSITIVE Sensitive     VANCOMYCIN 1 SENSITIVE Sensitive     TETRACYCLINE 2 SENSITIVE Sensitive     MOXIFLOXACIN <=0.25 SENSITIVE Sensitive     * ABUNDANT STAPHYLOCOCCUS AUREUS  Culture, blood (routine x 2)     Status: None   Collection Time: 01/13/15 12:13 AM  Result Value Ref Range Status   Specimen Description BLOOD RIGHT HAND  Final   Special Requests BOTTLES DRAWN AEROBIC ONLY 10CC  Final   Culture   Final    NO GROWTH 5 DAYS Performed at [facility]    Report Status 01/19/2015 FINAL  Final  Culture, blood (routine x 2)     Status: None   Collection Time: 01/13/15 12:22 AM  Result Value Ref Range Status   Specimen Description BLOOD LEFT ANTECUBITAL  Final   Special Requests BOTTLES DRAWN AEROBIC ONLY 10CC  Final   Culture   Final    NO  GROWTH 5 DAYS Performed at [facility]    Report Status 01/19/2015 FINAL  Final  Culture, respiratory (NON-Expectorated)     Status: None   Collection Time: 01/13/15 10:10 AM  Result Value Ref Range Status   Specimen Description TRACHEAL ASPIRATE  Final   Special Requests NONE  Final   Gram Stain   Final    MODERATE WBC PRESENT,BOTH PMN AND MONONUCLEAR RARE SQUAMOUS EPITHELIAL CELLS PRESENT NO ORGANISMS SEEN Performed at [facility]    Culture   Final    MODERATE STAPHYLOCOCCUS AUREUS Note: RIFAMPIN AND GENTAMICIN SHOULD NOT BE USED AS SINGLE DRUGS FOR  TREATMENT OF STAPH INFECTIONS. Performed at [facility]    Report Status 01/15/2015 FINAL  Final   Organism ID, Bacteria STAPHYLOCOCCUS AUREUS  Final      Susceptibility   Staphylococcus aureus - MIC*    CLINDAMYCIN <=0.25 SENSITIVE Sensitive     ERYTHROMYCIN 0.5 SENSITIVE Sensitive     GENTAMICIN <=0.5 SENSITIVE Sensitive     LEVOFLOXACIN <=0.12 SENSITIVE Sensitive     OXACILLIN 0.5 SENSITIVE Sensitive     PENICILLIN >=0.5 RESISTANT Resistant     RIFAMPIN <=0.5 SENSITIVE Sensitive     TRIMETH/SULFA <=10 SENSITIVE Sensitive     VANCOMYCIN 1 SENSITIVE Sensitive     TETRACYCLINE <=1 SENSITIVE Sensitive     MOXIFLOXACIN <=0.25 SENSITIVE Sensitive     * MODERATE STAPHYLOCOCCUS AUREUS  Culture, blood (routine x 2)     Status: None (Preliminary result)   Collection Time: 01/17/15 10:12 PM  Result Value Ref Range Status   Specimen Description BLOOD RIGHT HAND  Final   Special Requests BOTTLES DRAWN AEROBIC ONLY 4.5CC PT ON ROCEPHIN  Final   Culture   Final           BLOOD CULTURE RECEIVED NO GROWTH TO DATE CULTURE WILL BE HELD FOR 5 DAYS BEFORE ISSUING A FINAL NEGATIVE REPORT Performed at [facility]    Report Status PENDING  Incomplete  Culture, blood (routine x 2)     Status: None (Preliminary result)   Collection Time: 01/17/15 10:15 PM  Result Value Ref Range Status   Specimen Description BLOOD RIGHT ARM  Final   Special Requests   Final    BOTTLES DRAWN AEROBIC AND ANAEROBIC 10CC EACH ROCEPHIN   Culture   Final           BLOOD CULTURE RECEIVED NO GROWTH TO DATE CULTURE WILL BE HELD FOR 5 DAYS BEFORE ISSUING A FINAL NEGATIVE REPORT Note: Culture results may be compromised due to an excessive volume of blood received in culture bottles. Performed at [facility]    Report Status PENDING  Incomplete    Medical History: Past Medical History  Diagnosis Date  . Hypertension     Medications:  Scheduled:  . amLODipine  10 mg Oral Daily  . antiseptic  oral rinse  7 mL Mouth Rinse QID  . aspirin  81 mg Per Tube Daily  . carvedilol  18.75 mg Oral BID WC  .  ceFAZolin (ANCEF) IV  2 g Intravenous 3 times per day  . chlorhexidine  15 mL Mouth Rinse BID  . famotidine  20 mg Per Tube Daily  . insulin aspart  0-15 Units Subcutaneous 6 times per day  . lisinopril  2.5 mg Oral Daily  . ticagrelor  90 mg Per Tube BID   Assessment: 49 yo male [presented on 01/08/15 with out of hospital arrest, shocked and brought  to [facility] for urgent cath. CODE STEMI Called and he was placed on hypothermia protocol.  Infectious Disease: Patient with MSAA PNA who was previously on ceftaz and vancomycin. WBC elevated at 20.1, Tmax 101.106F. CXR shows persistent unchanged bilateral pulm infiltrates.   6/8 Vanc>>6/10, 6/15>> 6/17 6/10 Linezolid>>6/12  6/8 Ceftaz>>6/12, 6/15>> 6/17 6/11 CTX >>6/15  6/10 Cipro>>6/11  6/17 Cefazolin>>  6/8 resp cx>> abundant staph aureus- MSSA  6/10 resp cx - MSSA  6/10 blood x2>>ngtd  6/14 bld x2 >>ngtd  Plan:  -Initiate cefazolin 2 gm q8h -Monitor clinical progress and renal function -Follow up length of therapy  Isaac Bliss, PharmD, BCPS Clinical Pharmacist Pager [phone] 01/20/2015 9:50 AM

## 2015-01-20 NOTE — Progress Notes (Addendum)
IV attempted by myself and two different IV therapist without success so that central line could be removed. Spoke with Dr Levada Schilling at [facility]. MD will put patient on list for CVC replacement today.

## 2015-01-20 NOTE — Progress Notes (Signed)
EKG CRITICAL VALUE     12 lead EKG performed.  Critical value noted.  [place] Cra[place], RN notified.   Wandalee Ferdinand, CCT 01/20/2015 7:09 AM

## 2015-01-20 NOTE — Progress Notes (Signed)
eLink Physician-Brief Progress Note Patient Name: Alexander Berry DOB: 1966-04-26 MRN: [id number]   Date of Service  01/20/2015  HPI/Events of Note  Muscle pain  eICU Interventions  Low dose fentanyl;     Intervention Category Minor Interventions: Routine modifications to care plan (e.g. PRN medications for pain, fever)  Nelda Bucks. 01/20/2015, 12:49 AM

## 2015-01-20 NOTE — Progress Notes (Signed)
Nutrition Follow-up  DOCUMENTATION CODES:  Obesity unspecified  INTERVENTION:  Needs medication to assist with BM.  If diet advanced RD will supplement diet as appropriate.   If enteral nutrition support needed recommend:  Initiate Jevity 1.2 @ 25 ml/hr via feeding tube and increase by 10 ml every 4 hours to goal rate of 75 ml/hr.   30 ml Prostat BID.    Tube feeding regimen provides 2360 kcal, 129 grams of protein, and 1458 ml of H2O.    NUTRITION DIAGNOSIS:  Inadequate oral intake related to inability to eat as evidenced by NPO status.  ongoing  GOAL:  Patient will meet greater than or equal to 90% of their needs  Not met  MONITOR:  Diet advancement, Labs, I & O's  REASON FOR ASSESSMENT:  Consult Enteral/tube feeding initiation and management  ASSESSMENT:  Pt admitted with VF arrest while playing soccer 6/5. S/P cath with drug-eluting stent.   Pt extubated 6/16 Per MD plan for vascular consult for gangrene toes.  Per RN pt failed swallow eval, SLP to re-eval 6/18. Pt would is not best candidate for NG, pt has pulled on all other tubes (self-extubated, pulled condom cath off).   Height:  Ht Readings from Last 1 Encounters:  No data found for Ht  170.2 cm   Weight:  Wt Readings from Last 1 Encounters:  01/14/15 205 lb 7.9 oz (93.21 kg)    Ideal Body Weight:  67.2 kg  Wt Readings from Last 10 Encounters:  01/14/15 205 lb 7.9 oz (93.21 kg)    BMI:  Body mass index is 32.18 kg/(m^2).  Estimated Nutritional Needs:  Kcal:  2300-2500  Protein:  134  Fluid:  > 2.3 L/day  Skin:  Wound (see comment) (burn to chest, gangrene toes per MD)  Diet Order:    NPO  EDUCATION NEEDS:  No education needs identified at this time   Intake/Output Summary (Last 24 hours) at 01/20/15 1457 Last data filed at 01/20/15 0900  Gross per 24 hour  Intake    675 ml  Output   1650 ml  Net   -975 ml    Last BM:  6/8  [place], LDN, [place]  Pager [phone] After Hours Pager

## 2015-01-20 NOTE — Consult Note (Signed)
Hospital Consult  Reason for Consult:  Gangrenous toes Referring Physician:  Vassie Loll MRN #:  [id number]  History of Present Illness: This is a 49 y.o. male who was brought to [facility] on 01/08/15 after having a VF arrest while playing soccer.  He had sudden syncope and collapse.  CPR was initiated by a bystander.  He underwent emergent catheterization with stenting of the mid RCA and is now on Brilinta.  He did require pressors and these were able to be weaned eventually.  He had extubated himself and was re-intubated and has since been extubated.  He did have ARF with his creatinine as high as 2.98, which is now down to 1.30.  He did have an anoxic brain injury with very good chance of recovery per neurology.  He is found to have gangrenous toes on both feet and VVS is consulted.  Through speaking with his family member, he was healthy prior to admission.  He does not have any hx of DM, pain in legs while walking, no hx of stroke, no hx of GI issues.  He denies having chest pain prior to these events.  He does not smoke.  His family member states that he did have high blood pressure prior to admission, but not on medication.  He is not on any home medications prior to admission.  Past Medical History  Diagnosis Date  . Hypertension     Past Surgical History  Procedure Laterality Date  . Cardiac catheterization N/A 01/08/2015    Procedure: Left Heart Cath and Coronary Angiography;  Surgeon: Lennette Bihari, MD;  Location: [facility] INVASIVE CV LAB;  Service: Cardiovascular;  Laterality: N/A;  . Cardiac catheterization  01/08/2015    Procedure: Coronary Stent Intervention;  Surgeon: Lennette Bihari, MD;  Location: [facility] INVASIVE CV LAB;  Service: Cardiovascular;;  . Cardiac catheterization  01/08/2015    Procedure: Coronary Balloon Angioplasty;  Surgeon: Lennette Bihari, MD;  Location: [facility] INVASIVE CV LAB;  Service: Cardiovascular;;  . Electrophysiologic study  01/08/2015    Procedure: Cardioversion;  Surgeon:  Lennette Bihari, MD;  Location: [facility] INVASIVE CV LAB;  Service: Cardiovascular;;    No Known Allergies  Prior to Admission medications   Not on File    History   Social History  . Marital Status: Married    Spouse Name: N/A  . Number of Children: N/A  . Years of Education: N/A   Occupational History  . Not on file.   Social History Main Topics  . Smoking status: Unknown If Ever Smoked  . Smokeless tobacco: Not on file  . Alcohol Use: Not on file  . Drug Use: Not on file  . Sexual Activity: Not on file   Other Topics Concern  . Not on file   Social History Narrative     Family History  Problem Relation Age of Onset  . Hypertension Father     ROS: [x]  Positive   [ ]  Negative   [ ]  All sytems reviewed and are negative  Cardiovascular: []  chest pain/pressure [x]  VF arrest playing soccer []  palpitations []  SOB lying flat []  DOE []  pain in legs while walking []  pain in legs at rest []  pain in legs at night []  non-healing ulcers [x]  gangrenous toes []  hx of DVT []  swelling in legs  Pulmonary: []  productive cough []  asthma/wheezing []  home O2  Neurologic: []  weakness in []  arms []  legs []  numbness in []  arms []  legs []  hx of CVA []   mini stroke difficulty speaking or slurred speech  temporary loss of vision in one eye  dizziness  Hematologic:  hx of cancer  bleeding problems  problems with blood clotting easily  Endocrine:    diabetes  thyroid disease  GI  vomiting blood  blood in stool  GU:  CKD/renal failure  HD--[]  M/W/F or  T/T/S  ARF - recovering  burning with urination  blood in urine  Psychiatric:  anxiety  depression  Musculoskeletal:  arthritis  joint pain  Integumentary:  rashes  ulcers  Constitutional:  fever  chills   Physical Examination  Filed Vitals:   01/20/15 0900  BP: 146/90  Pulse:   Temp:   Resp: 27   Body mass index is 32.18 kg/(m^2).  General:   WDWN in NAD Gait: Not observed HENT: WNL, normocephalic Pulmonary: normal non-labored breathing Cardiac: regular without left carotid bruit; unable to listen to left side as he has lines in place Abdomen: soft, NT/ND, no masses Skin: without rashes, without ulcers  Vascular Exam/Pulses:  Right Left  Radial 2+ (normal) 2+ (normal)  Femoral 2+ (normal) 2+ (normal)  Popliteal 2+ (normal) 2+ (normal)  DP 1+-2+  2+-3+ (normal)  PT trace absent   Extremities: with ischemic changes to right 3rd, 4th, & 5th toes and toes on left foot with mottling down the medial side of the foot, without Gangrene , without cellulitis; without open wounds;  Musculoskeletal: no muscle wasting or atrophy  Neurologic: A&O X 3; Appropriate Affect ; SENSATION: normal; MOTOR FUNCTION:  moving all extremities equally. Speech is fluent/normal   CBC    Component Value Date/Time   WBC 20.1* 01/20/2015 0345   RBC 3.39* 01/20/2015 0345   HGB 9.6* 01/20/2015 0345   HCT 29.5* 01/20/2015 0345   PLT 391 01/20/2015 0345   MCV 87.0 01/20/2015 0345   MCH 28.3 01/20/2015 0345   MCHC 32.5 01/20/2015 0345   RDW 17.5* 01/20/2015 0345    BMET    Component Value Date/Time   NA 145 01/20/2015 0345   K 3.6 01/20/2015 0345   CL 106 01/20/2015 0345   CO2 27 01/20/2015 0345   GLUCOSE 115* 01/20/2015 0345   BUN 42* 01/20/2015 0345   CREATININE 1.30* 01/20/2015 0345   CALCIUM 8.6* 01/20/2015 0345   GFRNONAA >60 01/20/2015 0345   GFRAA >60 01/20/2015 0345    COAGS: Lab Results  Component Value Date   INR 1.70* 01/08/2015   INR >10.00* 01/08/2015   INR 1.54* 01/08/2015     Non-Invasive Vascular Imaging:   None at this time-will order ABI's  Statin:  No. Beta Blocker:  Yes.   Aspirin:  Yes.   ACEI:  Yes.   ARB:  No. Other antiplatelets/anticoagulants:  Yes.   Brilinta   ASSESSMENT/PLAN: This is a 49 y.o. male who had a VF arrest playing soccer with subsequent cardiac intervention now on Brilinta.    -pt  does have gangrenous toes after he required several days of pressor support.  He has been off pressors and now has gangrenous toes bilaterally.  His family member states that the discoloration has actually gotten better.   -he has easily palpable DP pulses bilaterally with the left greater than right.  -we will obtain ABI's to check flow, but given his palpable pulses, these toes should heal, but will continue to observe.   -he did have ARF with a creatinine as high as 2.98, which is now down to 1.30. -he does have a leukocytosis that is  persistently increasing-continue to watch.  He is on Ancef.  Doreatha Massed, PA-C [facility] [phone]  Agree with above. He has palpable pedal pulses. He should have adequate circulation to heal his toes and according to the family they are showing significant signs of improvement. We will follow peripherally. It is unlikely that any further vascular workup will be indicated. Certainly I could follow the toes as an outpatient after his discharged if needed.  Waverly Ferrari, MD, FACS [employer] [phone] Office: [phone]

## 2015-01-20 NOTE — Significant Event (Signed)
RN noticed ST segment changes on telemetry and obtained 12 lead ECG demonstrating 60mm STE in III, ~46mm STE aVF with scooped STD in I/aVL. III and aVF have Q waves. No R wave in III and 32mm R wave in aVF. Last ECG was 6/15 and it demonstrated no inferior STE and smaller Q waves. Both III and aVF had >24mm R waves on 6/14.  So, this mornings ECG demonstrated new inferior STE with reduced/absent R wave voltage concerning for possible new or recent inferior transmural infarct.   Clinically, Alexander Berry denies CP, chest pressure, or dyspnea. His only complaint is feeling cool with cooling blanket.  He is hemodynamically stable without arrhythmias.   The above was discussed with Dr. Eldridge Dace. He felt that the ST segment changes may be related to late evolution of his 6/5 MI, new aneurysm formation, or a recent (over last 2-3d) infarct. Regardless of the etiology, in the setting of being symptom free and hemodynamically stable, there was no indication for urgent cath.  He recommended checking CKMB now, repeating an ECG in the AM, and contacting him with any changes in clinical status.

## 2015-01-20 NOTE — Evaluation (Signed)
Occupational Therapy Evaluation Patient Details Name: Alexander Berry MRN: [id number] DOB: 04-09-1966 Today's Date: 01/20/2015    History of Present Illness VF arrest while playing soccer 6/05. Prolonged ACLS. Hypothermia protocol ETT 6/5-6/9 self-extubated, reintubated 6/9-6/16   Clinical Impression   Pt was active and independent prior to admission.  Presents with generalized weakness in trunk and extremities, impaired balance, decreased activity tolerance and impaired cognition interfering with ability to perform self care. Pt's toes are gangrenous and painful with weightbearing.  Pt is dependent in all ADL and requires +2 assist for mobility.  Will follow acutely. Pt will need intense rehab.    Follow Up Recommendations  CIR;Supervision/Assistance - 24 hour    Equipment Recommendations  3 in 1 bedside comode    Recommendations for Other Services       Precautions / Restrictions Precautions Precautions: Fall Precaution Comments: watch sats and BP, gangrenous toes Restrictions Weight Bearing Restrictions: No      Mobility Bed Mobility        General bed mobility comments: not assessed, pt in chair, returned to chair  Transfers Overall transfer level: Needs assistance   Transfers: Sit to/from Stand Sit to Stand: +2 physical assistance;Max assist        General transfer comment: stood from recliner x 1, assisted with pad and gait belt with knees blocked    Balance Overall balance assessment: Needs assistance Sitting-balance support: Feet supported Sitting balance-Leahy Scale: Poor Sitting balance - Comments: flexed posture seated edge of chair with leaning in all directions                                    ADL Overall ADL's : Needs assistance/impaired Eating/Feeding: NPO   Grooming: Wash/dry hands;Wash/dry face;Oral care;Moderate assistance;Sitting   Upper Body Bathing: Maximal assistance;Sitting   Lower Body Bathing: Total  assistance;Bed level   Upper Body Dressing : Moderate assistance;Sitting   Lower Body Dressing: Total assistance;+2 for physical assistance;Bed level   Toilet Transfer: +2 for safety/equipment;Maximal assistance;Squat-pivot   Toileting- Clothing Manipulation and Hygiene: +2 for physical assistance;Total assistance               Vision     Perception     Praxis      Pertinent Vitals/Pain Pain Assessment: Faces Pain Score: 5  Faces Pain Scale: Hurts even more Pain Location: feet with weight bearing Pain Descriptors / Indicators: Grimacing Pain Intervention(s): Limited activity within patient's tolerance;Monitored during session;Repositioned     Hand Dominance Right   Extremity/Trunk Assessment Upper Extremity Assessment Upper Extremity Assessment: RUE deficits/detail;LUE deficits/detail RUE Deficits / Details: 3/5 RUE Coordination: decreased fine motor;decreased gross motor LUE Deficits / Details: 2+/5 LUE Coordination: decreased fine motor;decreased gross motor   Lower Extremity Assessment Lower Extremity Assessment: Defer to PT evaluation   Cervical / Trunk Assessment Cervical / Trunk Assessment: Other exceptions Cervical / Trunk Exceptions: maintaining forward head   Communication Communication Communication: Prefers language other than English (Spanish, but speaks and understands some [place])   Cognition Arousal/Alertness: Awake/alert Behavior During Therapy: Impulsive Overall Cognitive Status: Impaired/Different from baseline Area of Impairment: Orientation;Following commands;Safety/judgement;Problem solving Orientation Level: Disoriented to;Time;Situation;Place   Memory: Decreased short-term memory Following Commands: Follows one step commands with increased time;Follows one step commands inconsistently Safety/Judgement: Decreased awareness of safety;Decreased awareness of deficits   Problem Solving: Slow processing;Requires tactile cues;Requires  verbal cues General Comments: Pt thought he was in [facility]. Attempting to stand  stating he wanted to walk.   General Comments       Exercises Exercises:  (Instructed daughter in A/ AAROM B UEs while reclined in chai)     Shoulder Instructions      Home Living Family/patient expects to be discharged to:: Private residence Living Arrangements: Spouse/significant other;Children Available Help at Discharge: Family;Available 24 hours/day Type of Home: House Home Access: Stairs to enter [employer] of Steps: 3   Home Layout: One level     Bathroom Shower/Tub: Chief Strategy Officer: Standard     Home Equipment: None   Additional Comments: pt works in [employer]      Prior Functioning/Environment Level of Independence: Independent             OT Diagnosis: Cognitive deficits;Generalized weakness;Acute pain   OT Problem List: Decreased strength;Decreased activity tolerance;Impaired balance (sitting and/or standing);Decreased coordination;Decreased cognition;Decreased safety awareness;Decreased knowledge of use of DME or AE;Cardiopulmonary status limiting activity;Impaired UE functional use;Pain;Impaired sensation   OT Treatment/Interventions: Self-care/ADL training;Therapeutic exercise;DME and/or AE instruction;Cognitive remediation/compensation;Patient/family education;Balance training;Therapeutic activities    OT Goals(Current goals can be found in the care plan section) Acute Rehab OT Goals Patient Stated Goal: return to playing soccer OT Goal Formulation: With patient Time For Goal Achievement: 02/03/15 Potential to Achieve Goals: Good ADL Goals Pt Will Perform Eating: with supervision;sitting Pt Will Perform Grooming: with supervision;sitting Pt Will Perform Upper Body Bathing: with min assist;sitting Pt Will Perform Upper Body Dressing: with supervision;sitting Pt Will Transfer to Toilet: with min assist;ambulating;bedside  commode Pt/caregiver will Perform Home Exercise Program: Increased strength;Both right and left upper extremity;With Supervision (AROM B UEs x 10 reps ) Additional ADL Goal #1: Pt will be oriented x 4 using visual reminders/environmental cues.  OT Frequency: Min 2X/week   Barriers to D/C:            Co-evaluation              End of Session Equipment Utilized During Treatment: Gait belt;Oxygen Nurse Communication:  (disconnected chair alarm, due to pt moving about)  Activity Tolerance: Patient tolerated treatment well Patient left: in chair;with call bell/phone within reach;with family/visitor present   Time: 1255-1315 OT Time Calculation (min): 20 min Charges:  OT General Charges $OT Visit: 1 Procedure OT Evaluation $Initial OT Evaluation Tier I: 1 Procedure G-Codes:    Evern Bio 01/20/2015, 2:22 PM  [phone]

## 2015-01-20 NOTE — Progress Notes (Signed)
Subjective:  Extubated yesterday; no chest pain; Talked with the patient and reviewed with him the events following his cardiac arrest. No chest pain.  Objective:   Vital Signs : Filed Vitals:   01/20/15 0600 01/20/15 0700 01/20/15 0800 01/20/15 0900  BP: 126/84 140/87 164/83 146/90  Pulse:      Temp:   100.2 F (37.9 C)   TempSrc:   Rectal   Resp: _0 Height:      Weight:      SpO2: 98% 100% 100% 100%    Intake/Output from previous day:  Intake/Output Summary (Last 24 hours) at 01/20/15 0920 Last data filed at 01/20/15 0900  Gross per 24 hour  Intake 1178.22 ml  Output   2325 ml  Net -1146.78 ml    I/O since admission: - 4014  Wt Readings from Last 3 Encounters:  01/14/15 205 lb 7.9 oz (93.21 kg)    Medications: . amLODipine  10 mg Oral Daily  . antiseptic oral rinse  7 mL Mouth Rinse QID  . aspirin  81 mg Per Tube Daily  . carvedilol  12.5 mg Oral BID WC  . chlorhexidine  15 mL Mouth Rinse BID  . famotidine  20 mg Per Tube Daily  . insulin aspart  0-15 Units Subcutaneous 6 times per day  . ticagrelor  90 mg Per Tube BID    . sodium chloride Stopped (01/17/15 1300)    Physical Exam:   General appearance: able to vocalize; amnesic to events Neck: no adenopathy, no carotid bruit, no JVD, supple, symmetrical, trachea midline and thyroid not enlarged, symmetric, no tenderness/mass/nodules Lungs: scattered rhonchi Heart: regular rate and rhythm; 1/6 systolic murmur and no s3 Abdomen: soft, non-tender; BS + but diminished; no masses,  no organomegaly Extremities: discoloration to toes L>R Neurologic: no focal defects   Rate: 91  Rhythm: NSR  ECG today (independently read by me):   Inferior Posterior Infarct pattern with slightly more J point elevation inferiorly and STscalloping V2   Prior ECG (independently read by me): from 01/11/15:  ST at 102 with evolving STT changes inferolaterally from MI but with preserved inferior R waves suggesting  myocardial salvage.  ECG (independently read by me): from 01/17/15:  NSR at 90; inferior posterior infarct  With Q waves inferiorly but preserved R waves suggesting myocardial salvage with early transiton due to posterior wall involvement. Lab Results:   Recent Labs  01/18/15 0430 01/19/15 0330 01/20/15 0345  NA 148* 143 145  K 3.7 3.6 3.6  CL 112* 101 106  CO2 _1 GLUCOSE 116* 110* 115*  BUN 46* 43* 42*  CREATININE 1.54* 1.29* 1.30*  CALCIUM 7.9* 7.9* 8.6*  MG 2.3 2.1 2.3  PHOS 3.8 5.1* 3.8   Hepatic Function Latest Ref Rng 01/16/2015 01/15/2015 01/14/2015  Total Protein 6.5 - 8.1 g/dL 5.5(L) 5.4(L) 5.5(L)  Albumin 3.5 - 5.0 g/dL 2.1(L) 2.2(L) 2.2(L)  AST 15 - 41 U/L 75(H) 84(H) 121(H)  ALT 17 - 63 U/L 144(H) 173(H) 216(H)  Alk Phosphatase 38 - 126 U/L 89 69 57  Total Bilirubin 0.3 - 1.2 mg/dL 3.2(H) 3.0(H) 2.9(H)  Bilirubin, Direct 0.1 - 0.5 mg/dL - - 0.9(H)     Recent Labs  01/18/15 0430 01/19/15 0330 01/20/15 0345  WBC 13.9* 15.3* 20.1*  HGB 7.3* 7.1* 9.6*  HCT 23.0* 22.3* 29.5*  MCV 87.5 87.1 87.0  PLT 160 207 391   BNP (last 3 results) No results for input(s):  BNP in the last 8760 hours.  ProBNP (last 3 results) No results for input(s): PROBNP in the last 8760 hours.   No results for input(s): TROPONINI in the last 72 hours.  Invalid input(s): CK, MB  No results found for: TSH  No results for input(s): PROT, ALBUMIN, AST, ALT, ALKPHOS, BILITOT, BILIDIR, IBILI in the last 72 hours. No results for input(s): INR in the last 72 hours. BNP (last 3 results) No results for input(s): BNP in the last 8760 hours.  ProBNP (last 3 results) No results for input(s): PROBNP in the last 8760 hours.   Lipid Panel     Component Value Date/Time   TRIG 263* 01/19/2015 0130     No results for input(s): HGBA1C in the last 72 hours.   Imaging:  Mr Herby Abraham Contrast  01/19/2015   CLINICAL DATA:  Cardiac arrest on soccer field, ventricular fibrillation.  Evaluate anoxic brain injury. History of hyperglycemia, myocardial infarction, acute kidney injury.  EXAM: MRI HEAD WITHOUT CONTRAST  TECHNIQUE: Multiplanar, multiecho pulse sequences of the brain and surrounding structures were obtained without intravenous contrast.  COMPARISON:  CT head January 10, 2015  FINDINGS: The ventricles and sulci are normal for patient's age. No abnormal parenchymal signal, mass lesions, mass effect. No reduced diffusion to suggest acute ischemia. No susceptibility artifact to suggest hemorrhage.  No abnormal extra-axial fluid collections. No extra-axial masses though, contrast enhanced sequences would be more sensitive. Normal major intracranial vascular flow voids seen at the skull base.  Ocular globes and orbital contents are unremarkable though not tailored for evaluation. No abnormal sellar expansion. No suspicious calvarial bone marrow signal. No abnormal sellar expansion. Craniocervical junction maintained. Small bilateral mastoid effusions. Paranasal sinus mucosal thickening with sphenoid sinus air-fluid levels likely secondary to life-support lines  IMPRESSION: Normal noncontrast MRI of the brain; no MR findings of anoxic brain injury/ hypoxic ischemic injury.   Electronically Signed   By: Elon Alas M.D.   On: 01/19/2015 05:42   Dg Chest Port 1 View  01/20/2015   CLINICAL DATA:  Hypoxia.  Extubation.  EXAM: PORTABLE CHEST - 1 VIEW  COMPARISON:  01/19/2015.  FINDINGS: Interim extubation removal of NG tube. Right IJ line in stable position. Heart size stable. Persistent diffuse bilateral pulmonary infiltrates. No interim improvement. No pleural effusion or pneumothorax.  IMPRESSION: 1. Interim removal of endotracheal tube and NG tube. Right IJ line stable position.  2.  Persistent unchanged diffuse bilateral pulmonary infiltrates.   Electronically Signed   By: Marcello Moores  Register   On: 01/20/2015 07:31   Dg Chest Port 1 View  01/19/2015   CLINICAL DATA:  Hypoxia.  Status  post cardiac arrest  EXAM: PORTABLE CHEST - 1 VIEW  COMPARISON:  January 18, 2015  FINDINGS: Endotracheal tube tip is 2.8 cm above the carina. Nasogastric tube tip and side port are in the stomach. Central catheter tip is in the superior vena cava. No pneumothorax. Moderate interstitial edema remains bilaterally, stable. There is no new opacity. Heart is upper normal in size with pulmonary vascular within normal limits, stable. No adenopathy.  IMPRESSION: Tube and catheter positions as described without pneumothorax. Moderate generalized interstitial edema, stable. No new opacity. Suspect pulmonary edema, possibly noncardiogenic given the clinical history. There may be a degree of underlying ARDS or possible aspiration pneumonitis.   Electronically Signed   By: Lowella Grip III M.D.   On: 01/19/2015 08:20   Dg Abd Portable 1v  01/19/2015   CLINICAL DATA:  OG  tube placement.  EXAM: PORTABLE ABDOMEN - 1 VIEW  COMPARISON:  01/08/2015  FINDINGS: Enteric tube tip is in the left upper quadrant consistent with location in the body of the stomach. Visualized bowel gas pattern is normal.  IMPRESSION: Enteric tube tip is in the left upper quadrant consistent with location in the body of the stomach.   Electronically Signed   By: Lucienne Capers M.D.   On: 01/19/2015 06:53      Assessment/Plan:   Active Problems:   Cardiac arrest   Acute ST elevation myocardial infarction (STEMI) involving right coronary artery   Acute respiratory failure with hypoxia   Hypokalemia   Acute pulmonary edema   Hyperglycemia   Encephalopathy acute   Pneumonia, organism unspecified   Encounter for intubation   Septic shock   ARDS (adult respiratory distress syndrome)   Thrombocytopenia   Staphylococcal pneumonia   AKI (acute kidney injury)   Anoxic brain injury   Absolute anemia  1. Day 12 s/p  Out of hospital VF cardiac arrest/Inferior STEMI s/p emergent PCI by me and PCI/DES to mid RCA occlusion with  very large  dominant RCA with extensive thrombus burden and distal embolization with PTCA and opening of 2 distal branches. Myocardial salvage suggested by ECG and echo.  EF 50% by echo 2. Respiratory failure extubated yesterday 3. Sepsis/ARDS/Staph pneumonia: WBC continues to increase;  Today 20.1 fortaz/vanc was dc; now on ancef.  To dc central line today 4. Increase LFT: prob secondary to shock liver; improving 5.Renal insufficiency:  Cr 1.30 6. Anemia: further decreased; 7.1/22.3 yesterday;  Received 1 unit PRBC yesterday 7. Hypernatremia: resolved at 143 today  On ASA/Brilinta for DAPT;  Continues with BP lability. Lopressor changed to carvedilol. Currently at 12.5 mg bid , will increase coreg to 18.75 mg bid today and titrate as tolerates to 25 mg bid.  Renal fxn has remained stable; will lisinopril 2.5 mg initially post MI and titrate as tolerates. Continue DAPT. F/u WBC; antibiotics changes; lines to be removed.   Troy Sine, MD, [facility] 01/20/2015, 9:20 AM

## 2015-01-20 NOTE — Progress Notes (Signed)
PULMONARY / CRITICAL CARE MEDICINE   Name: Alexander Berry MRN: [id number] DOB: Dec 21, 1965    ADMISSION DATE:  01/08/2015 CONSULTATION DATE:  01/08/2015    PT PROFILE:   VF arrest while playing soccer 6/05. Prolonged ACLS. DES to RCA. Hypothermia protocol  MAJOR EVENTS/TEST RESULTS: 6/05 Admitted after OOH arrest 6/05 LHC: Dist RCA lesion, 80% stenosed. Mid RCA lesion, 100% stenosed. There is a 0% residual stenosis post intervention. A drug-eluting stent was placed. 2nd RPLB-1 lesion, 100% stenosed. 2nd RPLB-2 lesion, 100% stenosed. There is a 0% residual stenosis post intervention. RPDA lesion, 80% stenosed. There is a 0% residual stenosis post intervention.  6/06 EEG: Abnormal EEG due to the presence of a generalized slowing indicating a mild to moderate cerebral disturbance (encephalopathy). No epileptiform activity noted 6/06 TTE: The estimated ejection fraction was in the range of 55% to 60%. Mild posterior wall hypokinesis - decreased Definity microsphere uptake in the posterior wall suggests hypoperfusion  6/07 Rewarmed. Continuous sedatives stopped. RASS -5. No spont movement 6/07 CT head: NAD 6/07 repeat EEG: Abnormal EEG due to the presence of a generalized slowing indicating a mild to moderate cerebral disturbance (encephalopathy). No epileptiform activity noted 6/08 Increased agitation requiring sedation. Not F/C. Increased O2 reqts. Frothy pink sputum. Resp culture obatined. PCT elevated. Empiric abx initiated. CXR c/w increased pulm edema. Hypertensive. Furosemide ordered 6/08 repeat limited TTE: LVEF 50%. Inferior wall AK 6/09 Intermittently F/C. RASS -2 to +1. Requiring 70% O2. Dexmedetomidine initiated 6/09 PM: self extubated, required immediate re-intubation. High fevers - cooling blanket initiated. Hypotension - NE initiated 6/10 High fevers persist. Arctic sun device re-ordered to maintain T < 99.5. PCT remained elevated. Abx adjusted. Cr rising. New dx of severe  sepsis/septic shock rendered. Elevated bilirubin  6/10 RUQ [place]: biliary sludge. No obstruction 6/11 Off vasopressors. Fevers controlled. Cr improving. No WUA due to increasing agitation 6/12 ARDS persists. AKI improving. Worsening hypernatremia - D5W initiated  INDWELLING DEVICES: Femoral sheath 6/05 >> 6/07 ETT 6/05 >>  R IJ CVL 6/05 >>   MICRO DATA: Resp 6/08 >> MSSA resp 6/10 >> MSSA  Blood 6/10 >> ng  PCT 6/08: 7.46           6/12: 5.15  ANTIMICROBIALS:  Vanc 6/08 >> stopped at one point then restarted 6/15>>> Linezolid 6/10 >> 6/11  Ciprofloxacin 6/10 >> 6/12 Ceftazidime 6/08 >> 6/12>>>6/15>>> Ceftriaxone 6/12 >> 6/15  SUBJ: Febrile Off precedex   VITAL SIGNS: Temp:  [99.2 F (37.3 C)-102 F (38.9 C)] 100.7 F (38.2 C) (06/17 0400) Pulse Rate:  [117] 117 (06/16 1023) Resp:  [18-28] 27 (06/17 0700) BP: (118-179)/(56-87) 140/87 mmHg (06/17 0700) SpO2:  [93 %-100 %] 100 % (06/17 0700) Arterial Line BP: (150-189)/(69-81) 189/81 mmHg (06/16 1000)  HEMODYNAMICS:   VENTILATOR SETTINGS:     INTAKE / OUTPUT:  Intake/Output Summary (Last 24 hours) at 01/20/15 0854 Last data filed at 01/20/15 0600  Gross per 24 hour  Intake 1178.22 ml  Output   2225 ml  Net -1046.78 ml   PHYSICAL EXAMINATION: General: RASS 0,  Neuro: calm,interactive, follows commands, power 3/5 HEENT: [place]/AT, PERRL, EOM-I and MMM. Cardiovascular:  RRR, Nl S1/S2, -M/R/G. Lungs: Coarse BS diffusely, weak cough Abdomen: Soft, NT, ND and +BS. Ext: Warm, no LE edema  LABS:  PULMONARY  Recent Labs Lab 01/16/15 0612 01/17/15 0418 01/18/15 0317 01/19/15 0320 01/20/15 0304  PHART 7.217* 7.435 7.459* 7.500* 7.555*  PCO2ART 64.7* 40.5 38.6 45.6* 29.5*  PO2ART 95.1 124* 86.6 109.0* 71.0*  HCO3 25.3* 26.7* 27.0* 35.4* 25.9*  TCO2 27.3 28.0 28.2 37 27  O2SAT 94.5 98.8 97.1 98.0 96.0   CBC  Recent Labs Lab 01/18/15 0430 01/19/15 0330 01/20/15 0345  HGB 7.3* 7.1* 9.6*  HCT 23.0*  22.3* 29.5*  WBC 13.9* 15.3* 20.1*  PLT 160 207 391   COAGULATION No results for input(s): INR in the last 168 hours.  CARDIAC   No results for input(s): TROPONINI in the last 168 hours. No results for input(s): PROBNP in the last 168 hours.  CHEMISTRY  Recent Labs Lab 01/16/15 0228 01/17/15 0435 01/18/15 0430 01/19/15 0330 01/20/15 0345  NA 150* 151* 148* 143 145  K 3.7 3.6 3.7 3.6 3.6  CL 116* 114* 112* 101 106  CO2 GLUCOSE 141* 104* 116* 110* 115*  BUN 54* 54* 46* 43* 42*  CREATININE 1.43* 1.56* 1.54* 1.29* 1.30*  CALCIUM 8.1* 8.3* 7.9* 7.9* 8.6*  MG  --  2.4 2.3 2.1 2.3  PHOS  --  3.5 3.8 5.1* 3.8   Estimated Creatinine Clearance: 76.4 mL/min (by C-G formula based on Cr of 1.3).  LIVER  Recent Labs Lab 01/13/15 1100 01/14/15 0441 01/15/15 0500 01/16/15 0228  AST 126* 121* 84* 75*  ALT 223* 216* 173* 144*  ALKPHOS 47 57 69 89  BILITOT 2.4* 2.9* 3.0* 3.2*  PROT 5.1* 5.5* 5.4* 5.5*  ALBUMIN 2.1* 2.2* 2.2* 2.1*   INFECTIOUS  Recent Labs Lab 01/15/15 0500  PROCALCITON 5.15   ENDOCRINE CBG (last 3)   Recent Labs  01/20/15 0014 01/20/15 0345 01/20/15 0800  GLUCAP 119* 104* 112*   CXR: NSC ARDS pattern  ASSESSMENT / PLAN: CARDIOVASCULAR A:  Acute STEMI 6/05 s/p PCI/DES to mid RCA occlusion  Out of hospital VF arrest 6/05 Cardiogenic shock, resolved Hypertension, controlled Mild sinus tachycardia Septic shock 6/09, resolved 6/11 P:  Cards following -concern for ST elevation 6/17 MAP goal 65 mmHg. Change lopressor to coreg 12.5 mg PO BID. Increase norvasc to 10 mg PO daily with holding parameters. Vascular consult for gangrene toes  PULMONARY A:  VDRF post arrest 6/05 Pulm edema 6/06 MSSA PNA 6/08 ARDS 6/09 P:   Extubated , IS    RENAL A:  AKI, nonoliguric 6/05 ? CKD (Cr 2.15 on admission) Hypokalemia, recurrent Hypernatremia - worsening  Suspect post ATN diuresis P:   Monitor BMET  intermittently. Correct electrolytes as indicated.   GASTROINTESTINAL A:   Shock liver - improving transaminases Hyperbilirubinemia Biliary sludge by RUQ [place] 6/10 P:   SUP: enteral famotidine Swallow eval Monitor LFTs intermittently  HEMATOLOGIC A:   Anemia without overt blood loss Mild thrombocytopenia -resolved P:  DVT px: Brilinta, SCDs. Monitor CBC intermittently. Transfuse per usual guidelines.  INFECTIOUS A:   Elevated PCT MSSA PNA Fever P:   Monitor temp, WBC count. Micro and abx as above. Dc IJ  ENDOCRINE A:   Hyperglycemia without prior dx of DM P:   Cont SSI  NEUROLOGIC A:   Anoxic encephalopathy - improved,prognosis appears to be good MRI WNL  P:   PRN fentanyl. Dc Seroquel Neurology consult appreciated.  FAMILY  Family updated in detail 6/17.  Summary - Much improved post arrest, needs aggressive PT  The patient is critically ill with multiple organ systems failure and requires high complexity decision making for assessment and support, frequent evaluation and titration of therapies, application of advanced monitoring technologies and extensive interpretation of multiple databases. Critical Care Time devoted to patient care  services described in this note independent of APP time is 35 minutes.    Cyril Mourning MD. Tonny Bollman. [facility] Pager [phone] If no response call [phone]

## 2015-01-20 NOTE — Progress Notes (Signed)
Changes in ST elevation noted on patient's monitor, patient denies chest pain, nausea/vomiting, or SOB at this time. EKG performed that showed ST elevation, Dr. Zachery Conch with cardiology notified. Dr. Zachery Conch at bedside to assess patient at this time. Will continue to monitor.

## 2015-01-20 NOTE — Evaluation (Signed)
Clinical/Bedside Swallow Evaluation Patient Details  Name: Alexander Berry MRN: [id number] Date of Birth: 05-11-1966  Today's Date: 01/20/2015 Time: SLP Start Time (ACUTE ONLY): 1012 SLP Stop Time (ACUTE ONLY): 1030 SLP Time Calculation (min) (ACUTE ONLY): 18 min  Past Medical History:  Past Medical History  Diagnosis Date  . Hypertension    Past Surgical History:  Past Surgical History  Procedure Laterality Date  . Cardiac catheterization N/A 01/08/2015    Procedure: Left Heart Cath and Coronary Angiography;  Surgeon: Lennette Bihari, MD;  Location: [facility] INVASIVE CV LAB;  Service: Cardiovascular;  Laterality: N/A;  . Cardiac catheterization  01/08/2015    Procedure: Coronary Stent Intervention;  Surgeon: Lennette Bihari, MD;  Location: [facility] INVASIVE CV LAB;  Service: Cardiovascular;;  . Cardiac catheterization  01/08/2015    Procedure: Coronary Balloon Angioplasty;  Surgeon: Lennette Bihari, MD;  Location: [facility] INVASIVE CV LAB;  Service: Cardiovascular;;  . Electrophysiologic study  01/08/2015    Procedure: Cardioversion;  Surgeon: Lennette Bihari, MD;  Location: [facility] INVASIVE CV LAB;  Service: Cardiovascular;;   HPI:  49 y/o male with no prior cardiac history and HTN admitted after cardiac arrest while playing soccer. Intubated 6/5, self extubated 6/9, re-intubated 6/9-6/16, stent placement, ARDS. CXDR Persistent unchanged diffuse bilateral pulmonary infiltrates.   Assessment / Plan / Recommendation Clinical Impression  Pt exhibiting signs of reversible and acute dysphagia induced by 12 day intubation. Pt's daughter and wife present with daughter assisting in translation. Pt moderately confues    Aspiration Risk  Severe    Diet Recommendation NPO;Ice chips PRN after oral care   Medication Administration:  (small pill in applesauce)    Other  Recommendations Oral Care Recommendations:  (per protocol)   Follow Up Recommendations       Frequency and Duration min 2x/week  2 weeks   Pertinent  Vitals/Pain Min sore throat         Swallow Study Prior Functional Status  Type of Home: House Available Help at Discharge: Family;Available 24 hours/day       Oral/Motor/Sensory Function Overall Oral Motor/Sensory Function:  (generalized weakness/deconditioning)   Ice Chips Ice chips: Impaired Presentation: Spoon Pharyngeal Phase Impairments: Throat Clearing - Delayed;Throat Clearing - Immediate;Suspected delayed Swallow   Thin Liquid Thin Liquid: Impaired Presentation: Spoon Oral Phase Impairments: Reduced lingual movement/coordination Pharyngeal  Phase Impairments: Suspected delayed Swallow;Cough - Immediate;Cough - Delayed    Nectar Thick Nectar Thick Liquid: Not tested   Honey Thick Honey Thick Liquid: Not tested   Puree Puree: Impaired Presentation: Spoon Pharyngeal Phase Impairments: Suspected delayed Swallow;Throat Clearing - Immediate;Cough - Delayed   Solid   GO    Solid: Not tested       Alexander Berry 01/20/2015,12:34 PM   Alexander Berry.Ed [employer] [phone]

## 2015-01-21 ENCOUNTER — Inpatient Hospital Stay (HOSPITAL_COMMUNITY): Payer: Medicaid Other

## 2015-01-21 LAB — GLUCOSE, CAPILLARY
GLUCOSE-CAPILLARY: 98 mg/dL (ref 65–99)
GLUCOSE-CAPILLARY: 113 mg/dL — AB (ref 65–99)
GLUCOSE-CAPILLARY: 123 mg/dL — AB (ref 65–99)
Glucose-Capillary: 103 mg/dL — ABNORMAL HIGH (ref 65–99)
Glucose-Capillary: 110 mg/dL — ABNORMAL HIGH (ref 65–99)
Glucose-Capillary: 111 mg/dL — ABNORMAL HIGH (ref 65–99)
Glucose-Capillary: 133 mg/dL — ABNORMAL HIGH (ref 65–99)

## 2015-01-21 LAB — CBC WITH DIFFERENTIAL/PLATELET
Basophils Absolute: 0 10*3/uL (ref 0.0–0.1)
Basophils Relative: 0 % (ref 0–1)
EOS PCT: 1 % (ref 0–5)
Eosinophils Absolute: 0.1 10*3/uL (ref 0.0–0.7)
HCT: 28.8 % — ABNORMAL LOW (ref 39.0–52.0)
HEMOGLOBIN: 9.4 g/dL — AB (ref 13.0–17.0)
LYMPHS ABS: 0.6 10*3/uL — AB (ref 0.7–4.0)
LYMPHS PCT: 4 % — AB (ref 12–46)
MCH: 28.5 pg (ref 26.0–34.0)
MCHC: 32.6 g/dL (ref 30.0–36.0)
MCV: 87.3 fL (ref 78.0–100.0)
MONO ABS: 1.1 10*3/uL — AB (ref 0.1–1.0)
Monocytes Relative: 8 % (ref 3–12)
NEUTROS ABS: 11.9 10*3/uL — AB (ref 1.7–7.7)
Neutrophils Relative %: 87 % — ABNORMAL HIGH (ref 43–77)
Platelets: 531 10*3/uL — ABNORMAL HIGH (ref 150–400)
RBC: 3.3 MIL/uL — AB (ref 4.22–5.81)
RDW: 17.4 % — ABNORMAL HIGH (ref 11.5–15.5)
WBC: 13.6 10*3/uL — ABNORMAL HIGH (ref 4.0–10.5)

## 2015-01-21 LAB — BASIC METABOLIC PANEL
ANION GAP: 10 (ref 5–15)
BUN: 46 mg/dL — ABNORMAL HIGH (ref 6–20)
CO2: 25 mmol/L (ref 22–32)
Calcium: 8.6 mg/dL — ABNORMAL LOW (ref 8.9–10.3)
Chloride: 110 mmol/L (ref 101–111)
Creatinine, Ser: 1.3 mg/dL — ABNORMAL HIGH (ref 0.61–1.24)
Glucose, Bld: 113 mg/dL — ABNORMAL HIGH (ref 65–99)
Potassium: 3.1 mmol/L — ABNORMAL LOW (ref 3.5–5.1)
Sodium: 145 mmol/L (ref 135–145)

## 2015-01-21 MED ORDER — LISINOPRIL 10 MG PO TABS
10.0000 mg | ORAL_TABLET | Freq: Every day | ORAL | Status: DC
  Administered 2015-01-21 – 2015-01-23 (×3): 10 mg via ORAL
  Filled 2015-01-21 (×3): qty 1

## 2015-01-21 MED ORDER — RESOURCE THICKENUP CLEAR PO POWD
ORAL | Status: DC | PRN
  Filled 2015-01-21: qty 125

## 2015-01-21 MED ORDER — SPIRONOLACTONE 25 MG PO TABS
12.5000 mg | ORAL_TABLET | Freq: Every day | ORAL | Status: DC
  Administered 2015-01-21 – 2015-01-24 (×4): 12.5 mg via ORAL
  Filled 2015-01-21 (×4): qty 1

## 2015-01-21 MED ORDER — POTASSIUM CHLORIDE CRYS ER 20 MEQ PO TBCR
30.0000 meq | EXTENDED_RELEASE_TABLET | ORAL | Status: AC
  Administered 2015-01-21: 30 meq via ORAL
  Administered 2015-01-21: 10 meq via ORAL
  Filled 2015-01-21 (×4): qty 1

## 2015-01-21 NOTE — Progress Notes (Signed)
[name] - Stepdown/ICU TEAM Progress Note  Alexander Berry BJY:[id number] DOB: [birth year] DOA: 01/08/2015 PCP: No PCP Per Patient  Admit HPI / Brief Narrative: 49 y/o HM PMHx no significant history, no prior cardiac history.  Presented to [facility] urgently by EMS as a wittenssed out-of hospital cardiac arrest. He was playing soccer when he had sudden syncope and collapse. Bystander CPR was preformed. On EMS arrival, he was in Vfib. CPR was continued. He was given Epi x 9 and Amiodarone x 2. He was intubated in the field. EKG now shows global ST elevations. He has been taken urgently to the cath lab.   His family reports he has a h/o HTN but he is not on any medications. No history of tobacco use. No known history of DM. No family h/o CAD or SCD. His father had HTN. His wife reports that he did complain of chest discomfort early this am. No reports of any other symptoms.  HPI/Subjective: 6/18 A/O 4, somewhat sleepy however follows all commands, negative SOB, negative CP. Patient's voice is still weak but understandable secondary to intubation.  Assessment/Plan: Acute STEMI 6/05 s/p PCI/DES to mid RCA occlusion  Out of hospital VF arrest 6/05 Cardiogenic shock, resolved Hypertension, controlled Mild sinus tachycardia Septic shock 6/09, resolved 6/11 P:  Cards following - coreg & lisinopril being titrated Vascular consulted for gangrene toes - outpt FU  VDRF post arrest 6/05  Pulm edema 6/06 MSSA PNA 6/08 ARDS 6/09 P:  Extubated , IS   AKI, nonoliguric 6/05 ? CKD (Cr 2.15 on admission) Hypokalemia, recurrent Hypernatremia - worsening Suspect post ATN diuresis P:  Monitor BMET intermittently. Correct electrolytes as indicated.   GASTROINTESTINAL A:  Shock liver - improving transaminases Hyperbilirubinemia Biliary sludge by RUQ [place] 6/10 Dysphagia  P:  SUP: enteral famotidine Swallow eval -expect to improve Monitor LFTs  intermittently  HEMATOLOGIC A:  Anemia without overt blood loss Mild thrombocytopenia -resolved P:  DVT px: Brilinta, SCDs. Monitor CBC intermittently. Transfuse per usual guidelines.  INFECTIOUS A:  Elevated PCT MSSA PNA Fever - improving with removal of CVL P:  Monitor temp, WBC count. Micro and abx as above. Dc IJ  ENDOCRINE A:  Hyperglycemia without prior dx of DM P:  Cont SSI  NEUROLOGIC A:  Anoxic encephalopathy - improved,prognosis appears to be good MRI WNL  P:  PRN fentanyl. Dc Seroquel CIR input  NOTE; will file as no charge secondary to [facility] M seen patient today   Code Status: FULL Family Communication: no family present at time of exam Disposition Plan:     Consultants:   Procedure/Significant Events: 6/05 Admitted after OOH arrest 6/05 LHC: Dist RCA lesion, 80% stenosed. Mid RCA lesion, 100% stenosed. There is a 0% residual stenosis post intervention. A drug-eluting stent was placed. 2nd RPLB-1 lesion, 100% stenosed. 2nd RPLB-2 lesion, 100% stenosed. There is a 0% residual stenosis post intervention. RPDA lesion, 80% stenosed. There is a 0% residual stenosis post intervention.  6/06 EEG: Abnormal EEG due to the presence of a generalized slowing indicating a mild to moderate cerebral disturbance (encephalopathy). No epileptiform activity noted 6/06 TTE: The estimated ejection fraction was in the range of 55% to 60%. Mild posterior wall hypokinesis - decreased Definity microsphere uptake in the posterior wall suggests hypoperfusion  6/07 Rewarmed. Continuous sedatives stopped. RASS -5. No spont movement 6/07 CT head: NAD 6/07 repeat EEG: Abnormal EEG due to the presence of a generalized slowing indicating a mild to moderate cerebral disturbance (encephalopathy). No  epileptiform activity noted 6/08 Increased agitation requiring sedation. Not F/C. Increased O2 reqts. Frothy pink sputum. Resp culture obatined. PCT elevated.  Empiric abx initiated. CXR c/w increased pulm edema. Hypertensive. Furosemide ordered 6/08 repeat limited TTE: LVEF 50%. Inferior wall AK 6/09 Intermittently F/C. RASS -2 to +1. Requiring 70% O2. Dexmedetomidine initiated 6/09 PM: self extubated, required immediate re-intubation. High fevers - cooling blanket initiated. Hypotension - NE initiated 6/10 High fevers persist. Arctic sun device re-ordered to maintain T < 99.5. PCT remained elevated. Abx adjusted. Cr rising. New dx of severe sepsis/septic shock rendered. Elevated bilirubin  6/10 RUQ [place]: biliary sludge. No obstruction 6/11 Off vasopressors. Fevers controlled. Cr improving. No WUA due to increasing agitation 6/12 ARDS persists. AKI improving. Worsening hypernatremia - D5W initiated   Culture Resp 6/08 >> MSSA resp 6/10 >> MSSA  Blood 6/10 >> ng  PCT 6/08: 7.46   6/12: 5.15  Antibiotics: Vanc 6/08 >> stopped at one point then restarted 6/15>>> Linezolid 6/10 >> 6/11  Ciprofloxacin 6/10 >> 6/12 Ceftazidime 6/08 >> 6/12>>>6/15>>> Ceftriaxone 6/12 >> 6/15  DVT prophylaxis: Brilinta   Devices   LINES / TUBES:  Femoral sheath 6/05 >> 6/07 ETT 6/05 >>  R IJ CVL 6/05 >>6/17      Continuous Infusions: . sodium chloride 10 mL/hr (01/21/15 0122)    Objective: VITAL SIGNS: Temp: 100.1 F (37.8 C) (06/18 0400) Temp Source: Rectal (06/18 0400) BP: 145/73 mmHg (06/18 0400) SPO2; FIO2:   Intake/Output Summary (Last 24 hours) at 01/21/15 5009 Last data filed at 01/21/15 3818  Gross per 24 hour  Intake    150 ml  Output   2600 ml  Net  -2450 ml     Exam: General: A/O 4, NAD, No acute respiratory distress Eyes: Negative headache,  negative retinal hemorrhage ENT: Negative Runny nose, negative ear pain, negative tinnitus, negative gingival bleeding,  Neck:  Negative scars, masses, torticollis, lymphadenopathy, JVD Lungs: Clear to auscultation bilaterally without wheezes or  crackles Cardiovascular: Regular rate and rhythm without murmur gallop or rub normal S1 and S2 Abdomen:negative abdominal pain, negative dysphagia, Nontender, nondistended, soft, bowel sounds positive, no rebound, no ascites, no appreciable mass Extremities: No significant cyanosis, clubbing, or edema bilateral lower extremities Psychiatric:  Negative depression, negative anxiety, negative fatigue, negative mania  Neurologic:  Cranial nerves II through XII intact, tongue/uvula midline, all extremities muscle strength 5/5, sensation intact throughout, negative dysarthria, negative expressive aphasia, negative receptive aphasia.      Data Reviewed: Basic Metabolic Panel:  Recent Labs Lab 01/17/15 0435 01/18/15 0430 01/19/15 0330 01/20/15 0345 01/21/15 0330  NA 151* 148* 143 145 145  K 3.6 3.7 3.6 3.6 3.1*  CL 114* 112* 101 106 110  CO2 28 28 30 27 25   GLUCOSE 104* 116* 110* 115* 113*  BUN 54* 46* 43* 42* 46*  CREATININE 1.56* 1.54* 1.29* 1.30* 1.30*  CALCIUM 8.3* 7.9* 7.9* 8.6* 8.6*  MG 2.4 2.3 2.1 2.3  --   PHOS 3.5 3.8 5.1* 3.8  --    Liver Function Tests:  Recent Labs Lab 01/15/15 0500 01/16/15 0228  AST 84* 75*  ALT 173* 144*  ALKPHOS 69 89  BILITOT 3.0* 3.2*  PROT 5.4* 5.5*  ALBUMIN 2.2* 2.1*   No results for input(s): LIPASE, AMYLASE in the last 168 hours. No results for input(s): AMMONIA in the last 168 hours. CBC:  Recent Labs Lab 01/17/15 0435 01/18/15 0430 01/19/15 0330 01/20/15 0345 01/21/15 0330  WBC 12.1* 13.9* 15.3* 20.1* 13.6*  NEUTROABS  --   --   --   --  11.9*  HGB 7.7* 7.3* 7.1* 9.6* 9.4*  HCT 24.2* 23.0* 22.3* 29.5* 28.8*  MCV 88.0 87.5 87.1 87.0 87.3  PLT 115* 160 207 391 531*   Cardiac Enzymes:  Recent Labs Lab 01/20/15 0345  CKTOTAL 213  CKMB 5.5*   BNP (last 3 results) No results for input(s): BNP in the last 8760 hours.  ProBNP (last 3 results) No results for input(s): PROBNP in the last 8760 hours.  CBG:  Recent  Labs Lab 01/20/15 1252 01/20/15 1635 01/20/15 2013 01/20/15 2352 01/21/15 0344  GLUCAP 125* 114* 104* 111* 98    Recent Results (from the past 240 hour(s))  Culture, respiratory (NON-Expectorated)     Status: None   Collection Time: 01/11/15 11:44 AM  Result Value Ref Range Status   Specimen Description TRACHEAL ASPIRATE  Final   Special Requests NONE  Final   Gram Stain   Final    FEW WBC PRESENT,BOTH PMN AND MONONUCLEAR RARE SQUAMOUS EPITHELIAL CELLS PRESENT NO ORGANISMS SEEN Performed at [facility]    Culture   Final    ABUNDANT STAPHYLOCOCCUS AUREUS Note: RIFAMPIN AND GENTAMICIN SHOULD NOT BE USED AS SINGLE DRUGS FOR TREATMENT OF STAPH INFECTIONS. Performed at [facility]    Report Status 01/14/2015 FINAL  Final   Organism ID, Bacteria STAPHYLOCOCCUS AUREUS  Final      Susceptibility   Staphylococcus aureus - MIC*    CLINDAMYCIN <=0.25 SENSITIVE Sensitive     ERYTHROMYCIN <=0.25 SENSITIVE Sensitive     GENTAMICIN <=0.5 SENSITIVE Sensitive     LEVOFLOXACIN <=0.12 SENSITIVE Sensitive     OXACILLIN 0.5 SENSITIVE Sensitive     PENICILLIN >=0.5 RESISTANT Resistant     RIFAMPIN <=0.5 SENSITIVE Sensitive     TRIMETH/SULFA <=10 SENSITIVE Sensitive     VANCOMYCIN 1 SENSITIVE Sensitive     TETRACYCLINE 2 SENSITIVE Sensitive     MOXIFLOXACIN <=0.25 SENSITIVE Sensitive     * ABUNDANT STAPHYLOCOCCUS AUREUS  Culture, blood (routine x 2)     Status: None   Collection Time: 01/13/15 12:13 AM  Result Value Ref Range Status   Specimen Description BLOOD RIGHT HAND  Final   Special Requests BOTTLES DRAWN AEROBIC ONLY 10CC  Final   Culture   Final    NO GROWTH 5 DAYS Performed at [facility]    Report Status 01/19/2015 FINAL  Final  Culture, blood (routine x 2)     Status: None   Collection Time: 01/13/15 12:22 AM  Result Value Ref Range Status   Specimen Description BLOOD LEFT ANTECUBITAL  Final   Special Requests BOTTLES DRAWN AEROBIC ONLY 10CC   Final   Culture   Final    NO GROWTH 5 DAYS Performed at [facility]    Report Status 01/19/2015 FINAL  Final  Culture, respiratory (NON-Expectorated)     Status: None   Collection Time: 01/13/15 10:10 AM  Result Value Ref Range Status   Specimen Description TRACHEAL ASPIRATE  Final   Special Requests NONE  Final   Gram Stain   Final    MODERATE WBC PRESENT,BOTH PMN AND MONONUCLEAR RARE SQUAMOUS EPITHELIAL CELLS PRESENT NO ORGANISMS SEEN Performed at [facility]    Culture   Final    MODERATE STAPHYLOCOCCUS AUREUS Note: RIFAMPIN AND GENTAMICIN SHOULD NOT BE USED AS SINGLE DRUGS FOR TREATMENT OF STAPH INFECTIONS. Performed at [facility]    Report Status  01/15/2015 FINAL  Final   Organism ID, Bacteria STAPHYLOCOCCUS AUREUS  Final      Susceptibility   Staphylococcus aureus - MIC*    CLINDAMYCIN <=0.25 SENSITIVE Sensitive     ERYTHROMYCIN 0.5 SENSITIVE Sensitive     GENTAMICIN <=0.5 SENSITIVE Sensitive     LEVOFLOXACIN <=0.12 SENSITIVE Sensitive     OXACILLIN 0.5 SENSITIVE Sensitive     PENICILLIN >=0.5 RESISTANT Resistant     RIFAMPIN <=0.5 SENSITIVE Sensitive     TRIMETH/SULFA <=10 SENSITIVE Sensitive     VANCOMYCIN 1 SENSITIVE Sensitive     TETRACYCLINE <=1 SENSITIVE Sensitive     MOXIFLOXACIN <=0.25 SENSITIVE Sensitive     * MODERATE STAPHYLOCOCCUS AUREUS  Culture, blood (routine x 2)     Status: None (Preliminary result)   Collection Time: 01/17/15 10:12 PM  Result Value Ref Range Status   Specimen Description BLOOD RIGHT HAND  Final   Special Requests BOTTLES DRAWN AEROBIC ONLY 4.5CC PT ON ROCEPHIN  Final   Culture   Final           BLOOD CULTURE RECEIVED NO GROWTH TO DATE CULTURE WILL BE HELD FOR 5 DAYS BEFORE ISSUING A FINAL NEGATIVE REPORT Performed at [facility]    Report Status PENDING  Incomplete  Culture, blood (routine x 2)     Status: None (Preliminary result)   Collection Time: 01/17/15 10:15 PM  Result Value Ref  Range Status   Specimen Description BLOOD RIGHT ARM  Final   Special Requests   Final    BOTTLES DRAWN AEROBIC AND ANAEROBIC 10CC EACH ROCEPHIN   Culture   Final           BLOOD CULTURE RECEIVED NO GROWTH TO DATE CULTURE WILL BE HELD FOR 5 DAYS BEFORE ISSUING A FINAL NEGATIVE REPORT Note: Culture results may be compromised due to an excessive volume of blood received in culture bottles. Performed at [facility]    Report Status PENDING  Incomplete     Studies:  Recent x-ray studies have been reviewed in detail by the Attending Physician  Scheduled Meds:  Scheduled Meds: . amLODipine  10 mg Oral Daily  . antiseptic oral rinse  7 mL Mouth Rinse QID  . aspirin  81 mg Per Tube Daily  . carvedilol  18.75 mg Oral BID WC  .  ceFAZolin (ANCEF) IV  2 g Intravenous 3 times per day  . chlorhexidine  15 mL Mouth Rinse BID  . famotidine  20 mg Per Tube Daily  . insulin aspart  0-15 Units Subcutaneous 6 times per day  . lisinopril  2.5 mg Oral Daily  . potassium chloride  30 mEq Oral Q4H  . ticagrelor  90 mg Per Tube BID    Time spent on care of this patient: 40 mins   Keina Mutch, Roselind Messier , MD  [facility] Office  [phone] Pager - [phone]  On-Call/Text Page:      [email or web address]      password [email or web address]  If 7PM-7AM, please contact night-coverage [email or web address] Password [email or web address] 01/21/2015, 8:11 AM   LOS: 13 days   Care during the described time interval was provided by me .  I have reviewed this patient's available data, including medical history, events of note, physical examination, and all test results as part of my evaluation. I have personally reviewed and interpreted all radiology studies.   Carolyne Littles, MD [phone] Pager

## 2015-01-21 NOTE — Progress Notes (Signed)
PULMONARY / CRITICAL CARE MEDICINE   Name: Alexander Berry MRN: [id number] DOB: 1965/09/18    ADMISSION DATE:  01/08/2015 CONSULTATION DATE:  01/08/2015    PT PROFILE:   VF arrest while playing soccer 6/05. Prolonged ACLS. DES to RCA. Hypothermia protocol  MAJOR EVENTS/TEST RESULTS: 6/05 Admitted after OOH arrest 6/05 LHC: Dist RCA lesion, 80% stenosed. Mid RCA lesion, 100% stenosed. There is a 0% residual stenosis post intervention. A drug-eluting stent was placed. 2nd RPLB-1 lesion, 100% stenosed. 2nd RPLB-2 lesion, 100% stenosed. There is a 0% residual stenosis post intervention. RPDA lesion, 80% stenosed. There is a 0% residual stenosis post intervention.  6/06 EEG: Abnormal EEG due to the presence of a generalized slowing indicating a mild to moderate cerebral disturbance (encephalopathy). No epileptiform activity noted 6/06 TTE: The estimated ejection fraction was in the range of 55% to 60%. Mild posterior wall hypokinesis - decreased Definity microsphere uptake in the posterior wall suggests hypoperfusion  6/07 Rewarmed. Continuous sedatives stopped. RASS -5. No spont movement 6/07 CT head: NAD 6/07 repeat EEG: Abnormal EEG due to the presence of a generalized slowing indicating a mild to moderate cerebral disturbance (encephalopathy). No epileptiform activity noted 6/08 Increased agitation requiring sedation. Not F/C. Increased O2 reqts. Frothy pink sputum. Resp culture obatined. PCT elevated. Empiric abx initiated. CXR c/w increased pulm edema. Hypertensive. Furosemide ordered 6/08 repeat limited TTE: LVEF 50%. Inferior wall AK 6/09 Intermittently F/C. RASS -2 to +1. Requiring 70% O2. Dexmedetomidine initiated 6/09 PM: self extubated, required immediate re-intubation. High fevers - cooling blanket initiated. Hypotension - NE initiated 6/10 High fevers persist. Arctic sun device re-ordered to maintain T < 99.5. PCT remained elevated. Abx adjusted. Cr rising. New dx of severe  sepsis/septic shock rendered. Elevated bilirubin  6/10 RUQ [place]: biliary sludge. No obstruction 6/11 Off vasopressors. Fevers controlled. Cr improving. No WUA due to increasing agitation 6/12 ARDS persists. AKI improving. Worsening hypernatremia - D5W initiated  INDWELLING DEVICES: Femoral sheath 6/05 >> 6/07 ETT 6/05 >>  R IJ CVL 6/05 >>6/17   MICRO DATA: Resp 6/08 >> MSSA resp 6/10 >> MSSA  Blood 6/10 >> ng  PCT 6/08: 7.46           6/12: 5.15  ANTIMICROBIALS:  Vanc 6/08 >> stopped at one point then restarted 6/15>>> Linezolid 6/10 >> 6/11  Ciprofloxacin 6/10 >> 6/12 Ceftazidime 6/08 >> 6/12>>>6/15>>> Ceftriaxone 6/12 >> 6/15  SUBJ: Fever curve down [place] No obvoius pain   VITAL SIGNS: Temp:  [99.6 F (37.6 C)-100.1 F (37.8 C)] 100.1 F (37.8 C) (06/18 0000) Pulse Rate:  [90] 90 (06/17 1221) Resp:  [18-35] 20 (06/18 0000) BP: (109-149)/(54-94) 140/66 mmHg (06/18 0000) SpO2:  [93 %-100 %] 99 % (06/18 0000)  HEMODYNAMICS:   VENTILATOR SETTINGS:     INTAKE / OUTPUT:  Intake/Output Summary (Last 24 hours) at 01/21/15 0801 Last data filed at 01/21/15 0200  Gross per 24 hour  Intake    100 ml  Output   2600 ml  Net  -2500 ml   PHYSICAL EXAMINATION: General: RASS 0,  Neuro: calm,interactive, follows commands, power 3/5 HEENT: [place]/AT, PERRL, EOM-I and MMM. Cardiovascular:  RRR, Nl S1/S2, -M/R/G. Lungs: Coarse BS diffusely, weak cough Abdomen: Soft, NT, ND and +BS. Ext: Warm, no LE edema  LABS:  PULMONARY  Recent Labs Lab 01/16/15 0612 01/17/15 0418 01/18/15 0317 01/19/15 0320 01/20/15 0304  PHART 7.217* 7.435 7.459* 7.500* 7.555*  PCO2ART 64.7* 40.5 38.6 45.6* 29.5*  PO2ART 95.1 124* 86.6 109.0* 71.0*  HCO3 25.3* 26.7* 27.0* 35.4* 25.9*  TCO2 27.3 28.0 28.2 37 27  O2SAT 94.5 98.8 97.1 98.0 96.0   CBC  Recent Labs Lab 01/19/15 0330 01/20/15 0345 01/21/15 0330  HGB 7.1* 9.6* 9.4*  HCT 22.3* 29.5* 28.8*  WBC 15.3* 20.1* 13.6*  PLT 207  391 531*   COAGULATION No results for input(s): INR in the last 168 hours.  CARDIAC   No results for input(s): TROPONINI in the last 168 hours. No results for input(s): PROBNP in the last 168 hours.  CHEMISTRY  Recent Labs Lab 01/17/15 0435 01/18/15 0430 01/19/15 0330 01/20/15 0345 01/21/15 0330  NA 151* 148* 143 145 145  K 3.6 3.7 3.6 3.6 3.1*  CL 114* 112* 101 106 110  CO2 GLUCOSE 104* 116* 110* 115* 113*  BUN 54* 46* 43* 42* 46*  CREATININE 1.56* 1.54* 1.29* 1.30* 1.30*  CALCIUM 8.3* 7.9* 7.9* 8.6* 8.6*  MG 2.4 2.3 2.1 2.3  --   PHOS 3.5 3.8 5.1* 3.8  --    Estimated Creatinine Clearance: 76.4 mL/min (by C-G formula based on Cr of 1.3).  LIVER  Recent Labs Lab 01/15/15 0500 01/16/15 0228  AST 84* 75*  ALT 173* 144*  ALKPHOS 69 89  BILITOT 3.0* 3.2*  PROT 5.4* 5.5*  ALBUMIN 2.2* 2.1*   INFECTIOUS  Recent Labs Lab 01/15/15 0500  PROCALCITON 5.15   ENDOCRINE CBG (last 3)   Recent Labs  01/20/15 2013 01/20/15 2352 01/21/15 0344  GLUCAP 104* 111* 98   CXR: NSC BL ASD   ASSESSMENT / PLAN: CARDIOVASCULAR A:  Acute STEMI 6/05 s/p PCI/DES to mid RCA occlusion  Out of hospital VF arrest 6/05 Cardiogenic shock, resolved Hypertension, controlled Mild sinus tachycardia Septic shock 6/09, resolved 6/11 P:  Cards following - coreg & lisinopril being titrated Vascular consulted for gangrene toes - outpt FU  PULMONARY A:  VDRF post arrest 6/05 Pulm edema 6/06 MSSA PNA 6/08 ARDS 6/09 P:   Extubated , IS    RENAL A:  AKI, nonoliguric 6/05 ? CKD (Cr 2.15 on admission) Hypokalemia, recurrent Hypernatremia - worsening  Suspect post ATN diuresis P:   Monitor BMET intermittently. Correct electrolytes as indicated.   GASTROINTESTINAL A:   Shock liver - improving transaminases Hyperbilirubinemia Biliary sludge by RUQ [place] 6/10 Dysphagia  P:   SUP: enteral famotidine Swallow eval -expect to improve Monitor LFTs  intermittently  HEMATOLOGIC A:   Anemia without overt blood loss Mild thrombocytopenia -resolved P:  DVT px: Brilinta, SCDs. Monitor CBC intermittently. Transfuse per usual guidelines.  INFECTIOUS A:   Elevated PCT MSSA PNA Fever - improving with removal of CVL P:   Monitor temp, WBC count. Micro and abx as above. Dc IJ  ENDOCRINE A:   Hyperglycemia without prior dx of DM P:   Cont SSI  NEUROLOGIC A:   Anoxic encephalopathy - improved,prognosis appears to be good MRI WNL  P:   PRN fentanyl. Dc Seroquel CIR input  FAMILY  Family updated in detail 6/17.  Summary - Much improved post arrest, needs aggressive PT [facility] to take over  Cyril Mourning MD. [facility] [place] [facility] Pager [phone] If no response call [phone]

## 2015-01-21 NOTE — Progress Notes (Addendum)
Subjective:   Continues to improve. Denies dyspnea or CP. BP improving. Remains with low-grade fevers. Worried about his feet.   Objective:   Vital Signs : Filed Vitals:   01/20/15 1900 01/21/15 0000 01/21/15 0400 01/21/15 0800  BP: 149/94 140/66 145/73 165/86  Pulse:    88  Temp: 100.1 F (37.8 C) 100.1 F (37.8 C) 100.1 F (37.8 C) 98.4 F (36.9 C)  TempSrc: Rectal Rectal Rectal Oral  Resp: 22 20 22 19   Height:      Weight:      SpO2: 98% 99% 96% 94%    Intake/Output from previous day:  Intake/Output Summary (Last 24 hours) at 01/21/15 1022 Last data filed at 01/21/15 0800  Gross per 24 hour  Intake    150 ml  Output   2600 ml  Net  -2450 ml    I/O since admission: - 2629  Wt Readings from Last 3 Encounters:  01/14/15 93.21 kg (205 lb 7.9 oz)    Medications: . amLODipine  10 mg Oral Daily  . antiseptic oral rinse  7 mL Mouth Rinse QID  . aspirin  81 mg Per Tube Daily  . carvedilol  18.75 mg Oral BID WC  .  ceFAZolin (ANCEF) IV  2 g Intravenous 3 times per day  . chlorhexidine  15 mL Mouth Rinse BID  . famotidine  20 mg Per Tube Daily  . insulin aspart  0-15 Units Subcutaneous 6 times per day  . lisinopril  2.5 mg Oral Daily  . ticagrelor  90 mg Per Tube BID    . sodium chloride 10 mL/hr (01/21/15 0122)    Physical Exam:   General appearance: sitting in bed.a wake. Conversant  Family at bedside who help with translation  Neck: no adenopathy, no carotid bruit, no JVD, supple, symmetrical, trachea midline and thyroid not enlarged, symmetric, no tenderness/mass/nodules Lungs: no wheezing Heart: regular rate and rhythm and no s3 Abdomen: soft, non-tender; BS + but diminished; no masses,  no organomegaly Extremities: no edema. Warm schemic 3-5 toes on left foot good pulses Skin: Skin color, texture, turgor normal. No rashes or lesions Neurologic: no focal defects   Lab Results:   Recent Labs  01/19/15 0330 01/20/15 0345 01/21/15 0330  NA 143  145 145  K 3.6 3.6 3.1*  CL 101 106 110  CO2 30 27 25   GLUCOSE 110* 115* 113*  BUN 43* 42* 46*  CREATININE 1.29* 1.30* 1.30*  CALCIUM 7.9* 8.6* 8.6*  MG 2.1 2.3  --   PHOS 5.1* 3.8  --    Hepatic Function Latest Ref Rng 01/16/2015 01/15/2015 01/14/2015  Total Protein 6.5 - 8.1 g/dL 5.5(L) 5.4(L) 5.5(L)  Albumin 3.5 - 5.0 g/dL 2.1(L) 2.2(L) 2.2(L)  AST 15 - 41 U/L 75(H) 84(H) 121(H)  ALT 17 - 63 U/L 144(H) 173(H) 216(H)  Alk Phosphatase 38 - 126 U/L 89 69 57  Total Bilirubin 0.3 - 1.2 mg/dL 3.2(H) 3.0(H) 2.9(H)  Bilirubin, Direct 0.1 - 0.5 mg/dL - - 0.9(H)     Recent Labs  01/19/15 0330 01/20/15 0345 01/21/15 0330  WBC 15.3* 20.1* 13.6*  NEUTROABS  --   --  11.9*  HGB 7.1* 9.6* 9.4*  HCT 22.3* 29.5* 28.8*  MCV 87.1 87.0 87.3  PLT 207 391 531*   BNP (last 3 results) No results for input(s): BNP in the last 8760 hours.  ProBNP (last 3 results) No results for input(s): PROBNP in the last 8760 hours.   No results  for input(s): TROPONINI in the last 72 hours.  Invalid input(s): CK, MB  No results found for: TSH  No results for input(s): PROT, ALBUMIN, AST, ALT, ALKPHOS, BILITOT, BILIDIR, IBILI in the last 72 hours. No results for input(s): INR in the last 72 hours. BNP (last 3 results) No results for input(s): BNP in the last 8760 hours.  ProBNP (last 3 results) No results for input(s): PROBNP in the last 8760 hours.   Lipid Panel     Component Value Date/Time   TRIG 263* 01/19/2015 0130     No results for input(s): HGBA1C in the last 72 hours.   Imaging:  Dg Chest Port 1 View  01/20/2015   CLINICAL DATA:  Hypoxia.  Extubation.  EXAM: PORTABLE CHEST - 1 VIEW  COMPARISON:  01/19/2015.  FINDINGS: Interim extubation removal of NG tube. Right IJ line in stable position. Heart size stable. Persistent diffuse bilateral pulmonary infiltrates. No interim improvement. No pleural effusion or pneumothorax.  IMPRESSION: 1. Interim removal of endotracheal tube and NG  tube. Right IJ line stable position.  2.  Persistent unchanged diffuse bilateral pulmonary infiltrates.   Electronically Signed   By: Marcello Moores  Register   On: 01/20/2015 07:31     Assessment/Plan:   Active Problems:   Cardiac arrest   Acute ST elevation myocardial infarction (STEMI) involving right coronary artery   Acute respiratory failure with hypoxia   Hypokalemia   Acute pulmonary edema   Hyperglycemia   Encephalopathy acute   Pneumonia, organism unspecified   Encounter for intubation   Septic shock   ARDS (adult respiratory distress syndrome)   Thrombocytopenia   Staphylococcal pneumonia   AKI (acute kidney injury)   Anoxic brain injury   Absolute anemia  1. Out of hospital VF cardiac arrest/Inferior STEMI     --s/p emergent PCI by and PCI/DES to mid RCA occlusion with  very large dominant RCA with extensive thrombus burden and distal embolization with PTCA and opening of 2 distal branches.     -- Myocardial salvage suggested by ECG and echo.  EF 50% by echo 2. Respiratory failure; resolved 3. Sepsis/ARDS/Staph pneumonia: Per CCM 4. Increase LFT: prob secondary to shock liver; improving 5 .Renal insufficiency:  Cr  improved 6. Necrotic toes 7. HTN 8. Hypokalemia  Continues to improve. For swallow study today. Can begin to ambulate with PT/OT. Can go to SDU    On ASA/Brilinta for DAPT; BP remains up. Increase ACE. Add spiro. Supp K+. Rhythm stable.   Amanda Steuart,MD 10:26 AM

## 2015-01-21 NOTE — Progress Notes (Signed)
   VASCULAR SURGERY ASSESSMENT & PLAN:  * Ischemic toes unchanged on left foot. Palpable pedal pulses.   * I will follow peripherally and can f/u toes as an out-pt.   SUBJECTIVE: No specific complaints.  PHYSICAL EXAM: Filed Vitals:   01/20/15 1700 01/20/15 1900 01/21/15 0000 01/21/15 0400  BP: 127/89 149/94 140/66 145/73  Pulse:      Temp:  100.1 F (37.8 C) 100.1 F (37.8 C) 100.1 F (37.8 C)  TempSrc:  Rectal Rectal Rectal  Resp:  22 20 22   Height:      Weight:      SpO2: 100% 98% 99% 96%   Palpable DP pulses Toes on left foot unchanged. No erythema or drainage.   LABS: Lab Results  Component Value Date   WBC 13.6* 01/21/2015   HGB 9.4* 01/21/2015   HCT 28.8* 01/21/2015   MCV 87.3 01/21/2015   PLT 531* 01/21/2015   Lab Results  Component Value Date   CREATININE 1.30* 01/21/2015   Lab Results  Component Value Date   INR 1.70* 01/08/2015   CBG (last 3)   Recent Labs  01/20/15 2013 01/20/15 2352 01/21/15 0344  GLUCAP 104* 111* 98    Active Problems:   Cardiac arrest   Acute ST elevation myocardial infarction (STEMI) involving right coronary artery   Acute respiratory failure with hypoxia   Hypokalemia   Acute pulmonary edema   Hyperglycemia   Encephalopathy acute   Pneumonia, organism unspecified   Encounter for intubation   Septic shock   ARDS (adult respiratory distress syndrome)   Thrombocytopenia   Staphylococcal pneumonia   AKI (acute kidney injury)   Anoxic brain injury   Absolute anemia  Cari Caraway Beeper: [phone] 01/21/2015

## 2015-01-21 NOTE — Progress Notes (Signed)
[facility] ICU Electrolyte Replacement Protocol  Patient Name: Alexander Berry DOB: Aug 24, 1965 MRN: [id number]  Date of Service  01/21/2015   HPI/Events of Note    Recent Labs Lab 01/17/15 0435 01/18/15 0430 01/19/15 0330 01/20/15 0345 01/21/15 0330  NA 151* 148* 143 145 145  K 3.6 3.7 3.6 3.6 3.1*  CL 114* 112* 101 106 110  CO2 _0 GLUCOSE 104* 116* 110* 115* 113*  BUN 54* 46* 43* 42* 46*  CREATININE 1.56* 1.54* 1.29* 1.30* 1.30*  CALCIUM 8.3* 7.9* 7.9* 8.6* 8.6*  MG 2.4 2.3 2.1 2.3  --   PHOS 3.5 3.8 5.1* 3.8  --     Estimated Creatinine Clearance: 76.4 mL/min (by C-G formula based on Cr of 1.3).  Intake/Output      06/17 0701 - 06/18 0700   IV Piggyback 100   Total Intake(mL/kg) 100 (1.1)   Urine (mL/kg/hr) 2600 (1.2)   Total Output 2600   Net -2500       Urine Occurrence 1 x    - I/O DETAILED x24h    Total I/O In: 50 [IV Piggyback:50] Out: 1050 [Urine:1050] - I/O THIS SHIFT    ASSESSMENT   eICURN Interventions  Electrolyte protocol criteria met.  Replace per protocol.  MD notified   ASSESSMENT: MAJOR ELECTROLYTE    Lorene Dy 01/21/2015, 4:29 AM

## 2015-01-22 DIAGNOSIS — I10 Essential (primary) hypertension: Secondary | ICD-10-CM

## 2015-01-22 DIAGNOSIS — I96 Gangrene, not elsewhere classified: Secondary | ICD-10-CM

## 2015-01-22 DIAGNOSIS — N189 Chronic kidney disease, unspecified: Secondary | ICD-10-CM

## 2015-01-22 DIAGNOSIS — K7200 Acute and subacute hepatic failure without coma: Secondary | ICD-10-CM | POA: Diagnosis present

## 2015-01-22 DIAGNOSIS — K759 Inflammatory liver disease, unspecified: Secondary | ICD-10-CM

## 2015-01-22 DIAGNOSIS — N179 Acute kidney failure, unspecified: Secondary | ICD-10-CM

## 2015-01-22 DIAGNOSIS — G931 Anoxic brain damage, not elsewhere classified: Secondary | ICD-10-CM | POA: Diagnosis present

## 2015-01-22 DIAGNOSIS — R1310 Dysphagia, unspecified: Secondary | ICD-10-CM

## 2015-01-22 DIAGNOSIS — R570 Cardiogenic shock: Secondary | ICD-10-CM | POA: Diagnosis present

## 2015-01-22 DIAGNOSIS — E870 Hyperosmolality and hypernatremia: Secondary | ICD-10-CM

## 2015-01-22 LAB — BASIC METABOLIC PANEL
ANION GAP: 9 (ref 5–15)
BUN: 41 mg/dL — AB (ref 6–20)
CO2: 21 mmol/L — AB (ref 22–32)
Calcium: 8.5 mg/dL — ABNORMAL LOW (ref 8.9–10.3)
Chloride: 118 mmol/L — ABNORMAL HIGH (ref 101–111)
Creatinine, Ser: 1.22 mg/dL (ref 0.61–1.24)
GLUCOSE: 121 mg/dL — AB (ref 65–99)
Potassium: 3.7 mmol/L (ref 3.5–5.1)
SODIUM: 148 mmol/L — AB (ref 135–145)

## 2015-01-22 LAB — GLUCOSE, CAPILLARY
GLUCOSE-CAPILLARY: 93 mg/dL (ref 65–99)
GLUCOSE-CAPILLARY: 100 mg/dL — AB (ref 65–99)
GLUCOSE-CAPILLARY: 123 mg/dL — AB (ref 65–99)
GLUCOSE-CAPILLARY: 132 mg/dL — AB (ref 65–99)
Glucose-Capillary: 102 mg/dL — ABNORMAL HIGH (ref 65–99)

## 2015-01-22 MED ORDER — DEXTROSE 5 % IV SOLN
INTRAVENOUS | Status: DC
  Administered 2015-01-22 (×2): via INTRAVENOUS
  Administered 2015-01-23: 75 mL via INTRAVENOUS
  Administered 2015-01-24: 06:00:00 via INTRAVENOUS

## 2015-01-22 MED ORDER — ENOXAPARIN SODIUM 40 MG/0.4ML ~~LOC~~ SOLN
40.0000 mg | SUBCUTANEOUS | Status: DC
  Administered 2015-01-22 – 2015-01-24 (×3): 40 mg via SUBCUTANEOUS
  Filled 2015-01-22 (×3): qty 0.4

## 2015-01-22 MED ORDER — CETYLPYRIDINIUM CHLORIDE 0.05 % MT LIQD
7.0000 mL | Freq: Two times a day (BID) | OROMUCOSAL | Status: DC
  Administered 2015-01-22 – 2015-01-23 (×2): 7 mL via OROMUCOSAL

## 2015-01-22 MED ORDER — AMLODIPINE BESYLATE 5 MG PO TABS
5.0000 mg | ORAL_TABLET | Freq: Every day | ORAL | Status: DC
  Administered 2015-01-23: 5 mg via ORAL
  Filled 2015-01-22: qty 1

## 2015-01-22 NOTE — Progress Notes (Signed)
[facility] TEAM 1 - Stepdown/ICU TEAM Progress Note  Alexander Berry KHV:[id number] DOB: 1966/06/25 DOA: 01/08/2015 PCP: No PCP Per Patient  Admit HPI / Brief Narrative: 49 y/o HM PMHx no significant history, no prior cardiac history.  Presented to [facility] urgently by EMS as a wittenssed out-of hospital cardiac arrest. He was playing soccer when he had sudden syncope and collapse. Bystander CPR was preformed. On EMS arrival, he was in Vfib. CPR was continued. He was given Epi x 9 and Amiodarone x 2. He was intubated in the field. EKG now shows global ST elevations. He has been taken urgently to the cath lab.   His family reports he has a h/o HTN but he is not on any medications. No history of tobacco use. No known history of DM. No family h/o CAD or SCD. His father had HTN. His wife reports that he did complain of chest discomfort early this am. No reports of any other symptoms.  HPI/Subjective: 6/19 A/O 4,negative SOB, negative CP. Patient's voice is still weak but understandable secondary to intubation. Patient complains of left foot pain  Assessment/Plan: Acute STEMI 6/05/  VF arrest 6/05 -s/p PCI/DES to mid RCA occlusion  -In and out since admission -8.2 L  Cardiogenic shock -resolved -In the a.m. work with NCM to get patient placed in CIR -CIR referral placed  Hypertension,  -Cards following - coreg & lisinopril being titrated  Septic shock  -Multifactorial l to include MSSA pneumonia, ARDS, and gangrenous toe - resolved 6/11 -Vascular consulted for gangrene toes - outpt FU  Gangrenous left toe -Patient third-fifth left metatarsal gangrenous; evaluated by vascular surgery who would like for patient to follow-up as an outpatient -Counseled patient that acutely this problem would not be addressed secondary to his cardiac arrest.  Acute on chronic renal failure?(Cr 2.15 on admission) -Resolved, monitor closely  Hypokalemia,  -Potassium goal> 4 -Currently slightly under 4, recheck  in a.m. -Check magnesium in a.m.  Hypernatremia -D5W at 19ml/hr  Shock liver -Biliary sludge by RUQ [place] 6/10 - improving transaminases?  -Trending liver enzymes  Hyperbilirubinemia   Dysphagia    Anemia without overt blood loss -Check in a.m.  Anoxic encephalopathy - Improved  -MRI WNL     Code Status: FULL Family Communication: no family present at time of exam Disposition Plan: CIR    Consultants: Dr.Daniel R Bensimhon (heart failure team) Dr.Christopher Adele Dan (vascular surgery) Dr.Rakesh Vassie [facility] M)   Procedure/Significant Events: 6/05 Admitted after OOH arrest 6/05 LHC: Dist RCA lesion, 80% stenosed. Mid RCA lesion, 100% stenosed. There is a 0% residual stenosis post intervention. A drug-eluting stent was placed. 2nd RPLB-1 lesion, 100% stenosed. 2nd RPLB-2 lesion, 100% stenosed. There is a 0% residual stenosis post intervention. RPDA lesion, 80% stenosed. There is a 0% residual stenosis post intervention.  6/06 EEG: Abnormal EEG due to the presence of a generalized slowing indicating a mild to moderate cerebral disturbance (encephalopathy). No epileptiform activity noted 6/06 TTE: The estimated ejection fraction was in the range of 55% to 60%. Mild posterior wall hypokinesis - decreased Definity microsphere uptake in the posterior wall suggests hypoperfusion  6/07 Rewarmed. Continuous sedatives stopped. RASS -5. No spont movement 6/07 CT head: NAD 6/07 repeat EEG: Abnormal EEG due to the presence of a generalized slowing indicating a mild to moderate cerebral disturbance (encephalopathy). No epileptiform activity noted 6/08 Increased agitation requiring sedation. Not F/C. Increased O2 reqts. Frothy pink sputum. Resp culture obatined. PCT elevated. Empiric abx initiated. CXR c/w increased  pulm edema. Hypertensive. Furosemide ordered 6/08 repeat limited TTE: LVEF 50%. Inferior wall AK 6/09 Intermittently F/C. RASS -2 to +1. Requiring 70% O2.  Dexmedetomidine initiated 6/09 PM: self extubated, required immediate re-intubation. High fevers - cooling blanket initiated. Hypotension - NE initiated 6/10 High fevers persist. Arctic sun device re-ordered to maintain T < 99.5. PCT remained elevated. Abx adjusted. Cr rising. New dx of severe sepsis/septic shock rendered. Elevated bilirubin  6/10 RUQ [place]: biliary sludge. No obstruction 6/11 Off vasopressors. Fevers controlled. Cr improving. No WUA due to increasing agitation 6/12 ARDS persists. AKI improving. Worsening hypernatremia - D5W initiated   Culture 6/8 Resp positive MSSA 6/10 Resp positive  MSSA  6/10 Blood right hand/left antecubital negative 6/14 blood right hand/arm NGTD   Antibiotics: Vanc 6/08 >> stopped at one point then restarted 6/15>>> Linezolid 6/10 >> 6/11  Ciprofloxacin 6/10 >> 6/12 Ceftazidime 6/08 >> 6/12>>>6/15>>> Ceftriaxone 6/12 >> 6/15   DVT prophylaxis: Brilinta, SCD   Devices   LINES / TUBES:  Femoral sheath 6/05 >> 6/07 ETT 6/05 >>  R IJ CVL 6/05 >>6/17      Continuous Infusions: . sodium chloride 10 mL/hr (01/21/15 0122)  . dextrose      Objective: VITAL SIGNS: Temp: 98.3 F (36.8 C) (06/19 1621) Temp Source: Oral (06/19 1621) BP: 126/81 mmHg (06/19 1621) Pulse Rate: 74 (06/19 1119) SPO2; FIO2:   Intake/Output Summary (Last 24 hours) at 01/22/15 1922 Last data filed at 01/22/15 1304  Gross per 24 hour  Intake    510 ml  Output   1350 ml  Net   -840 ml     Exam: General: A/O 4, NAD, No acute respiratory distress Eyes: Negative headache,  negative retinal hemorrhage ENT: Negative Runny nose, negative ear pain, negative tinnitus, negative gingival bleeding,  Neck:  Negative scars, masses, torticollis, lymphadenopathy, JVD Lungs: Clear to auscultation bilaterally without wheezes or crackles Cardiovascular: Regular rate and rhythm without murmur gallop or rub normal S1 and S2 Abdomen:negative abdominal pain, negative  dysphagia, Nontender, nondistended, soft, bowel sounds positive, no rebound, no ascites, no appreciable mass Extremities: No significant cyanosis, clubbing, or edema bilateral lower extremities Psychiatric:  Negative depression, negative anxiety, negative fatigue, negative mania  Neurologic:  Cranial nerves II through XII intact, tongue/uvula midline, all extremities muscle strength 5/5, sensation intact throughout, negative dysarthria, negative expressive aphasia, negative receptive aphasia.      Data Reviewed: Basic Metabolic Panel:  Recent Labs Lab 01/17/15 0435 01/18/15 0430 01/19/15 0330 01/20/15 0345 01/21/15 0330 01/22/15 1230  NA 151* 148* 143 145 145 148*  K 3.6 3.7 3.6 3.6 3.1* 3.7  CL 114* 112* 101 106 110 118*  CO2 21*  GLUCOSE 104* 116* 110* 115* 113* 121*  BUN 54* 46* 43* 42* 46* 41*  CREATININE 1.56* 1.54* 1.29* 1.30* 1.30* 1.22  CALCIUM 8.3* 7.9* 7.9* 8.6* 8.6* 8.5*  MG 2.4 2.3 2.1 2.3  --   --   PHOS 3.5 3.8 5.1* 3.8  --   --    Liver Function Tests:  Recent Labs Lab 01/16/15 0228  AST 75*  ALT 144*  ALKPHOS 89  BILITOT 3.2*  PROT 5.5*  ALBUMIN 2.1*   No results for input(s): LIPASE, AMYLASE in the last 168 hours. No results for input(s): AMMONIA in the last 168 hours. CBC:  Recent Labs Lab 01/17/15 0435 01/18/15 0430 01/19/15 0330 01/20/15 0345 01/21/15 0330  WBC 12.1* 13.9* 15.3* 20.1* 13.6*  NEUTROABS  --   --   --   --  11.9*  HGB 7.7* 7.3* 7.1* 9.6* 9.4*  HCT 24.2* 23.0* 22.3* 29.5* 28.8*  MCV 88.0 87.5 87.1 87.0 87.3  PLT 115* 160 207 391 531*   Cardiac Enzymes:  Recent Labs Lab 01/20/15 0345  CKTOTAL 213  CKMB 5.5*   BNP (last 3 results) No results for input(s): BNP in the last 8760 hours.  ProBNP (last 3 results) No results for input(s): PROBNP in the last 8760 hours.  CBG:  Recent Labs Lab 01/21/15 2306 01/22/15 0327 01/22/15 0735 01/22/15 1118 01/22/15 1621  GLUCAP 110* 93 100* 123* 102*     Recent Results (from the past 240 hour(s))  Culture, blood (routine x 2)     Status: None   Collection Time: 01/13/15 12:13 AM  Result Value Ref Range Status   Specimen Description BLOOD RIGHT HAND  Final   Special Requests BOTTLES DRAWN AEROBIC ONLY 10CC  Final   Culture   Final    NO GROWTH 5 DAYS Performed at [facility]    Report Status 01/19/2015 FINAL  Final  Culture, blood (routine x 2)     Status: None   Collection Time: 01/13/15 12:22 AM  Result Value Ref Range Status   Specimen Description BLOOD LEFT ANTECUBITAL  Final   Special Requests BOTTLES DRAWN AEROBIC ONLY 10CC  Final   Culture   Final    NO GROWTH 5 DAYS Performed at [facility]    Report Status 01/19/2015 FINAL  Final  Culture, respiratory (NON-Expectorated)     Status: None   Collection Time: 01/13/15 10:10 AM  Result Value Ref Range Status   Specimen Description TRACHEAL ASPIRATE  Final   Special Requests NONE  Final   Gram Stain   Final    MODERATE WBC PRESENT,BOTH PMN AND MONONUCLEAR RARE SQUAMOUS EPITHELIAL CELLS PRESENT NO ORGANISMS SEEN Performed at [facility]    Culture   Final    MODERATE STAPHYLOCOCCUS AUREUS Note: RIFAMPIN AND GENTAMICIN SHOULD NOT BE USED AS SINGLE DRUGS FOR TREATMENT OF STAPH INFECTIONS. Performed at [facility]    Report Status 01/15/2015 FINAL  Final   Organism ID, Bacteria STAPHYLOCOCCUS AUREUS  Final      Susceptibility   Staphylococcus aureus - MIC*    CLINDAMYCIN <=0.25 SENSITIVE Sensitive     ERYTHROMYCIN 0.5 SENSITIVE Sensitive     GENTAMICIN <=0.5 SENSITIVE Sensitive     LEVOFLOXACIN <=0.12 SENSITIVE Sensitive     OXACILLIN 0.5 SENSITIVE Sensitive     PENICILLIN >=0.5 RESISTANT Resistant     RIFAMPIN <=0.5 SENSITIVE Sensitive     TRIMETH/SULFA <=10 SENSITIVE Sensitive     VANCOMYCIN 1 SENSITIVE Sensitive     TETRACYCLINE <=1 SENSITIVE Sensitive     MOXIFLOXACIN <=0.25 SENSITIVE Sensitive     * MODERATE  STAPHYLOCOCCUS AUREUS  Culture, blood (routine x 2)     Status: None (Preliminary result)   Collection Time: 01/17/15 10:12 PM  Result Value Ref Range Status   Specimen Description BLOOD RIGHT HAND  Final   Special Requests BOTTLES DRAWN AEROBIC ONLY 4.5CC PT ON ROCEPHIN  Final   Culture   Final           BLOOD CULTURE RECEIVED NO GROWTH TO DATE CULTURE WILL BE HELD FOR 5 DAYS BEFORE ISSUING A FINAL NEGATIVE REPORT Performed at [facility]    Report Status PENDING  Incomplete  Culture, blood (routine x 2)     Status: None (Preliminary result)   Collection Time: 01/17/15 10:15  PM  Result Value Ref Range Status   Specimen Description BLOOD RIGHT ARM  Final   Special Requests   Final    BOTTLES DRAWN AEROBIC AND ANAEROBIC 10CC EACH ROCEPHIN   Culture   Final           BLOOD CULTURE RECEIVED NO GROWTH TO DATE CULTURE WILL BE HELD FOR 5 DAYS BEFORE ISSUING A FINAL NEGATIVE REPORT Note: Culture results may be compromised due to an excessive volume of blood received in culture bottles. Performed at [facility]    Report Status PENDING  Incomplete     Studies:  Recent x-ray studies have been reviewed in detail by the Attending Physician  Scheduled Meds:  Scheduled Meds: . [START ON 01/23/2015] amLODipine  5 mg Oral Daily  . antiseptic oral rinse  7 mL Mouth Rinse BID  . aspirin  81 mg Per Tube Daily  . carvedilol  18.75 mg Oral BID WC  .  ceFAZolin (ANCEF) IV  2 g Intravenous 3 times per day  . enoxaparin (LOVENOX) injection  40 mg Subcutaneous Q24H  . famotidine  20 mg Per Tube Daily  . insulin aspart  0-15 Units Subcutaneous 6 times per day  . lisinopril  10 mg Oral Daily  . spironolactone  12.5 mg Oral Daily  . ticagrelor  90 mg Per Tube BID    Time spent on care of this patient: 40 mins   Damiyah Ditmars, Roselind Messier , MD  [facility] Office  [phone] Pager - [phone]  On-Call/Text Page:      [email or web address]      password [email or web address]  If 7PM-7AM, please  contact night-coverage [email or web address] Password [email or web address] 01/22/2015, 7:22 PM   LOS: 14 days   Care during the described time interval was provided by me .  I have reviewed this patient's available data, including medical history, events of note, physical examination, and all test results as part of my evaluation. I have personally reviewed and interpreted all radiology studies.   Carolyne Littles, MD [phone] Pager

## 2015-01-22 NOTE — Progress Notes (Signed)
Subjective:   Continues to improve. Denies dyspnea or CP. BP improving. Diuresing well. Low-grade fevers resolved. WBC down. Remains worried about his feet.   Mild aspiration with swallow study yesterday. Now on thickened liquids.   Objective:   Vital Signs : Filed Vitals:   01/21/15 2302 01/22/15 0320 01/22/15 0735 01/22/15 1119  BP: 123/76 136/60 146/71 124/60  Pulse: 74 75 74 74  Temp: 97.8 F (36.6 C) 97.7 F (36.5 C) 98.1 F (36.7 C) 98.5 F (36.9 C)  TempSrc: Oral Oral Oral Oral  Resp: 20 16 17 18   Height:      Weight:      SpO2: 100% 100% 100% 100%    Intake/Output from previous day:  Intake/Output Summary (Last 24 hours) at 01/22/15 1128 Last data filed at 01/22/15 0800  Gross per 24 hour  Intake    220 ml  Output   1850 ml  Net  -1630 ml    I/O since admission: - 2629  Wt Readings from Last 3 Encounters:  01/14/15 93.21 kg (205 lb 7.9 oz)    Medications: . amLODipine  10 mg Oral Daily  . antiseptic oral rinse  7 mL Mouth Rinse BID  . aspirin  81 mg Per Tube Daily  . carvedilol  18.75 mg Oral BID WC  .  ceFAZolin (ANCEF) IV  2 g Intravenous 3 times per day  . famotidine  20 mg Per Tube Daily  . insulin aspart  0-15 Units Subcutaneous 6 times per day  . lisinopril  10 mg Oral Daily  . spironolactone  12.5 mg Oral Daily  . ticagrelor  90 mg Per Tube BID    . sodium chloride 10 mL/hr (01/21/15 0122)    Physical Exam:   General appearance: sitting in bed.a wake. Conversant  Family at bedside who help with translation  Neck: no adenopathy, no carotid bruit, no JVD, supple, symmetrical, trachea midline and thyroid not enlarged, symmetric, no tenderness/mass/nodules Lungs: no wheezing Heart: regular rate and rhythm and no s3 Abdomen: soft, non-tender; BS + but diminished; no masses,  no organomegaly Extremities: no edema. Warm schemic 3-5 toes on left foot good pulses Skin: Skin color, texture, turgor normal. No rashes or lesions Neurologic: no  focal defects   Lab Results:   Recent Labs  01/20/15 0345 01/21/15 0330  NA 145 145  K 3.6 3.1*  CL 106 110  CO2 27 25  GLUCOSE 115* 113*  BUN 42* 46*  CREATININE 1.30* 1.30*  CALCIUM 8.6* 8.6*  MG 2.3  --   PHOS 3.8  --    Hepatic Function Latest Ref Rng 01/16/2015 01/15/2015 01/14/2015  Total Protein 6.5 - 8.1 g/dL 5.5(L) 5.4(L) 5.5(L)  Albumin 3.5 - 5.0 g/dL 2.1(L) 2.2(L) 2.2(L)  AST 15 - 41 U/L 75(H) 84(H) 121(H)  ALT 17 - 63 U/L 144(H) 173(H) 216(H)  Alk Phosphatase 38 - 126 U/L 89 69 57  Total Bilirubin 0.3 - 1.2 mg/dL 3.2(H) 3.0(H) 2.9(H)  Bilirubin, Direct 0.1 - 0.5 mg/dL - - 0.9(H)     Recent Labs  01/20/15 0345 01/21/15 0330  WBC 20.1* 13.6*  NEUTROABS  --  11.9*  HGB 9.6* 9.4*  HCT 29.5* 28.8*  MCV 87.0 87.3  PLT 391 531*   BNP (last 3 results) No results for input(s): BNP in the last 8760 hours.  ProBNP (last 3 results) No results for input(s): PROBNP in the last 8760 hours.   No results for input(s): TROPONINI in the last 72  hours.  Invalid input(s): CK, MB  No results found for: TSH  No results for input(s): PROT, ALBUMIN, AST, ALT, ALKPHOS, BILITOT, BILIDIR, IBILI in the last 72 hours. No results for input(s): INR in the last 72 hours. BNP (last 3 results) No results for input(s): BNP in the last 8760 hours.  ProBNP (last 3 results) No results for input(s): PROBNP in the last 8760 hours.   Lipid Panel     Component Value Date/Time   TRIG 263* 01/19/2015 0130     No results for input(s): HGBA1C in the last 72 hours.   Imaging:  Dg Swallowing Func-speech Pathology  01/21/2015    Objective Swallowing Evaluation:    Patient Details  Name: Alexander Berry MRN: [id number] Date of Birth: [birth year]  Today's Date: 01/21/2015 Time: SLP Start Time (ACUTE ONLY): 1119-SLP Stop Time (ACUTE ONLY): 1136 SLP Time Calculation (min) (ACUTE ONLY): 17 min  Past Medical History:  Past Medical History  Diagnosis Date  . Hypertension    Past Surgical  History:  Past Surgical History  Procedure Laterality Date  . Cardiac catheterization N/A 01/08/2015    Procedure: Left Heart Cath and Coronary Angiography;  Surgeon: Troy Sine, MD;  Location: [facility];  Service: Cardiovascular;   Laterality: N/A;  . Cardiac catheterization  01/08/2015    Procedure: Coronary Stent Intervention;  Surgeon: Troy Sine, MD;   Location: [facility];  Service: Cardiovascular;;  . Cardiac catheterization  01/08/2015    Procedure: Coronary Balloon Angioplasty;  Surgeon: Troy Sine, MD;   Location: [facility];  Service: Cardiovascular;;  . Electrophysiologic study  01/08/2015    Procedure: Cardioversion;  Surgeon: Troy Sine, MD;  Location: [facility];  Service: Cardiovascular;;   HPI:  Other Pertinent Information: 49 y/o male with no prior cardiac history and  HTN admitted after cardiac arrest while playing soccer. Intubated 6/5,  self extubated 6/9, re-intubated 6/9-6/16, stent placement, ARDS. CXDR  Persistent unchanged diffuse bilateral pulmonary infiltrates.  No Data Recorded  Assessment / Plan / Recommendation CHL IP CLINICAL IMPRESSIONS 01/21/2015  Therapy Diagnosis Mild oral phase dysphagia;Mild pharyngeal phase  dysphagia  Clinical Impression Pt presenting with acute reversible oral pharyngeal  dysphagia 2 days post 12 day intubation period. Pt appears slightly  lethargic with mild weakness. Pt with delayed swallow with all  consistencies triggering at the level of the valleculae.  Silent  aspiration seen x1 following thin liquid by cup. Cueing for volitional  cough did not clear the airway of the aspirates. Additional thin liquid  trials noted flash penetration.  Chin tuck maneuver instructed with the  use of the patients son for translation, however pt unable to follow  commands for adequate implementation. Pt with better airway protection of  nectar thick liquids with no penetration or aspiraiton evidenced  throughout the study. Fatigue  seen with regular textures including  prolonged mastication and piece meal deglutiton. Recommend dysphagia 2  diet with nectar thick liquids. Medicines whole with puree.  Anticipate as  mentation and strenght improve pt will safely tolerate upgraded diet  trials. ST follow up warranted.          CHL IP TREATMENT RECOMMENDATION 01/21/2015  Treatment Recommendations Therapy as outlined in treatment plan below     CHL IP DIET RECOMMENDATION 01/21/2015  SLP Diet Recommendations Nectar;Dysphagia 2 (Fine chop)  Liquid Administration via (None)  Medication Administration Whole meds with puree  Compensations Slow rate;Small sips/bites;Follow solids  with  liquid;Minimize environmental distractions  Postural Changes and/or Swallow Maneuvers (None)     CHL IP OTHER RECOMMENDATIONS 01/21/2015  Recommended Consults (None)  Oral Care Recommendations Oral care BID  Other Recommendations Order thickener from pharmacy     No flowsheet data found.   CHL IP FREQUENCY AND DURATION 01/21/2015  Speech Therapy Frequency (ACUTE ONLY) min 2x/week  Treatment Duration 2 weeks     Pertinent Vitals/Pain     SLP Swallow Goals No flowsheet data found.  No flowsheet data found.    CHL IP REASON FOR REFERRAL 01/21/2015  Reason for Referral Objectively evaluate swallowing function                 Arvil Chaco MA, CCC-SLP Acute Care Speech Language Pathologist    Levi Aland 01/21/2015, 12:04 PM      Assessment/Plan:   Active Problems:   Cardiac arrest   Acute ST elevation myocardial infarction (STEMI) involving right coronary artery   Acute respiratory failure with hypoxia   Hypokalemia   Acute pulmonary edema   Hyperglycemia   Encephalopathy acute   Pneumonia, organism unspecified   Encounter for intubation   Septic shock   ARDS (adult respiratory distress syndrome)   Thrombocytopenia   Staphylococcal pneumonia   AKI (acute kidney injury)   Anoxic brain injury   Absolute anemia  1. Out of hospital [facility] cardiac  arrest/Inferior STEMI     --s/p emergent PCI by and PCI/DES to mid RCA occlusion with  very large dominant RCA with extensive thrombus burden and distal embolization with PTCA and opening of 2 distal branches.     -- Myocardial salvage suggested by ECG and echo.  EF 50% by echo 2. Respiratory failure; resolved 3. Sepsis/ARDS/Staph pneumonia: Per CCM 4. Increase LFT: prob secondary to shock liver; improving 5 .Renal insufficiency:  Cr  improved 6. Necrotic toes 7. HTN 8. Hypokalemia  Continues to improve but remains very weak. PT recommends CIR.  On ASA/Brilinta for DAPT; BP improved. Continue current regimen. Needs BMET today.  Rhythm stable. If BP goes too low would stop amlodipine.    Analia Zuk,MD 11:28 AM

## 2015-01-23 DIAGNOSIS — R5381 Other malaise: Secondary | ICD-10-CM

## 2015-01-23 LAB — CBC WITH DIFFERENTIAL/PLATELET
BASOS ABS: 0 10*3/uL (ref 0.0–0.1)
Basophils Relative: 0 % (ref 0–1)
EOS ABS: 0.2 10*3/uL (ref 0.0–0.7)
Eosinophils Relative: 2 % (ref 0–5)
HCT: 33 % — ABNORMAL LOW (ref 39.0–52.0)
HEMOGLOBIN: 10 g/dL — AB (ref 13.0–17.0)
LYMPHS ABS: 1 10*3/uL (ref 0.7–4.0)
Lymphocytes Relative: 9 % — ABNORMAL LOW (ref 12–46)
MCH: 27.8 pg (ref 26.0–34.0)
MCHC: 30.3 g/dL (ref 30.0–36.0)
MCV: 91.7 fL (ref 78.0–100.0)
Monocytes Absolute: 0.8 10*3/uL (ref 0.1–1.0)
Monocytes Relative: 7 % (ref 3–12)
NEUTROS PCT: 82 % — AB (ref 43–77)
Neutro Abs: 9.2 10*3/uL — ABNORMAL HIGH (ref 1.7–7.7)
Platelets: 749 10*3/uL — ABNORMAL HIGH (ref 150–400)
RBC: 3.6 MIL/uL — AB (ref 4.22–5.81)
RDW: 17.2 % — AB (ref 11.5–15.5)
WBC: 11.3 10*3/uL — ABNORMAL HIGH (ref 4.0–10.5)

## 2015-01-23 LAB — COMPREHENSIVE METABOLIC PANEL
ALT: 80 U/L — AB (ref 17–63)
ANION GAP: 10 (ref 5–15)
AST: 67 U/L — ABNORMAL HIGH (ref 15–41)
Albumin: 2.5 g/dL — ABNORMAL LOW (ref 3.5–5.0)
Alkaline Phosphatase: 112 U/L (ref 38–126)
BILIRUBIN TOTAL: 1.6 mg/dL — AB (ref 0.3–1.2)
BUN: 45 mg/dL — AB (ref 6–20)
CHLORIDE: 119 mmol/L — AB (ref 101–111)
CO2: 20 mmol/L — ABNORMAL LOW (ref 22–32)
Calcium: 8.9 mg/dL (ref 8.9–10.3)
Creatinine, Ser: 1.07 mg/dL (ref 0.61–1.24)
GFR calc Af Amer: >60 mL/min (ref >60)
GFR calc non Af Amer: >60 mL/min (ref >60)
GLUCOSE: 111 mg/dL — AB (ref 65–99)
Potassium: 4 mmol/L (ref 3.5–5.1)
SODIUM: 149 mmol/L — AB (ref 135–145)
Total Protein: 6.6 g/dL (ref 6.5–8.1)

## 2015-01-23 LAB — GLUCOSE, CAPILLARY
GLUCOSE-CAPILLARY: 102 mg/dL — AB (ref 65–99)
GLUCOSE-CAPILLARY: 99 mg/dL (ref 65–99)
Glucose-Capillary: 107 mg/dL — ABNORMAL HIGH (ref 65–99)
Glucose-Capillary: 113 mg/dL — ABNORMAL HIGH (ref 65–99)

## 2015-01-23 LAB — MAGNESIUM: Magnesium: 2.4 mg/dL (ref 1.7–2.4)

## 2015-01-23 MED ORDER — GLUCERNA SHAKE PO LIQD
237.0000 mL | Freq: Two times a day (BID) | ORAL | Status: DC
  Administered 2015-01-23 – 2015-01-24 (×2): 237 mL via ORAL

## 2015-01-23 MED ORDER — ASPIRIN 81 MG PO CHEW
81.0000 mg | CHEWABLE_TABLET | Freq: Every day | ORAL | Status: DC
  Administered 2015-01-24: 81 mg via ORAL
  Filled 2015-01-23: qty 1

## 2015-01-23 MED ORDER — FAMOTIDINE 40 MG/5ML PO SUSR
20.0000 mg | Freq: Every day | ORAL | Status: DC
  Administered 2015-01-24: 20 mg via ORAL
  Filled 2015-01-23: qty 2.5

## 2015-01-23 MED ORDER — DOCUSATE SODIUM 50 MG/5ML PO LIQD
100.0000 mg | Freq: Two times a day (BID) | ORAL | Status: DC | PRN
  Filled 2015-01-23: qty 10

## 2015-01-23 MED ORDER — ADULT MULTIVITAMIN W/MINERALS CH
1.0000 | ORAL_TABLET | Freq: Every day | ORAL | Status: DC
  Administered 2015-01-23 – 2015-01-24 (×2): 1 via ORAL
  Filled 2015-01-23 (×2): qty 1

## 2015-01-23 MED ORDER — TICAGRELOR 90 MG PO TABS
90.0000 mg | ORAL_TABLET | Freq: Two times a day (BID) | ORAL | Status: DC
  Administered 2015-01-23 – 2015-01-24 (×2): 90 mg via ORAL
  Filled 2015-01-23 (×3): qty 1

## 2015-01-23 MED ORDER — ACETAMINOPHEN 325 MG PO TABS: 650.0000 mg | ORAL_TABLET | ORAL | Status: DC | PRN

## 2015-01-23 NOTE — Progress Notes (Signed)
Inpatient Rehabilitation   I have met with the patient, his wife [name], and daughter Timithy Arons at the bedside to discuss a possible IP Rehab admission.  Pt. And family indicate they would like pt. to come for his rehab here at [facility].  I am currently working with the acute care and rehab teams to determine pt's medical readiness for possible admission, pending bed availability.  I will update the acute care team when pt's medical readiness is clarified.  Please call if questions.  [place] [facility] Admissions Coordinator Cell [phone] Office [phone]

## 2015-01-23 NOTE — Progress Notes (Signed)
Speech Language Pathology Treatment: Dysphagia  Patient Details Name: Alexander Berry MRN: [id number] DOB: Jan 29, 1966 Today's Date: 01/23/2015 Time: 0810-0826 SLP Time Calculation (min) (ACUTE ONLY): 16 min  Assessment / Plan / Recommendation Clinical Impression  Pt demonstrates no signs of aspiration over 8oz intake of thin liquids, able to masticate solids. Attention and safety awareness are improving. Recommend pt upgrade to a regular texture diet and thin liquids with basic aspiration precautions. No SLP f/u needed.    HPI Other Pertinent Information: 49 y/o male with no prior cardiac history and HTN admitted after cardiac arrest while playing soccer. Intubated 6/5, self extubated 6/9, re-intubated 6/9-6/16, stent placement, ARDS. CXDR Persistent unchanged diffuse bilateral pulmonary infiltrates.   Pertinent Vitals    SLP Plan  All goals met    Recommendations Diet recommendations: Regular;Thin liquid Liquids provided via: Cup;Straw Medication Administration: Whole meds with liquid Supervision: Patient able to self feed Postural Changes and/or Swallow Maneuvers: Seated upright 90 degrees              Oral Care Recommendations: Oral care BID Follow up Recommendations: None Plan: All goals met    GO    Herbie Baltimore, MA CCC-SLP [phone]  Lynann Beaver 01/23/2015, 8:30 AM

## 2015-01-23 NOTE — H&P (Signed)
Physical Medicine and Rehabilitation Admission H&P    Chief Complaint  Patient presents with  . Code STEMI  : HPI: Alexander Berry is a 49 y.o. right handed male with history of hypertension. 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 Admitted 01/08/2015 after witnessed out of hospital cardiac arrest. He was playing soccer when he suddenly collapsed. Bystander CPR was performed. EMS arrival patient was V. fib. He received epinephrine and amiodarone. He was intubated in the field. EKG showed global ST elevations. Underwent emergent cardiac catheterization with stenting and placed on Brilinta. Echocardiogram with ejection fraction of 60% and mild posterior wall hypokinesis. 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 Cranial CT scan negative. Hospital course respiratory failure as well as acute renal failure creatinine 2.98 with follow-up critical care medicine. Patient self extubated 01/12/2015. Developed persistent fevers suspect sepsis maintained on broad-spectrum antibodies with respiratory cultures 01/11/2015 showing MSSA pneumonia and course of Ancef has been completed. Blood cultures have been negative.. Right upper quadrant ultrasound showed biliary sludge no obstruction. Subcutaneous Lovenox for DVT prophylaxis. Dysphagia #2 nectar thick liquid diet and later advanced to a regular consistency diet. 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 Renal function has improved creatinine 1.07. Physical therapy evaluation completed with recommendations of physical medicine rehabilitation consult  ROS Review of Systems with translator assistance Constitutional: Positive for fever. Negative for malaise/fatigue.  Eyes: Negative for blurred vision and double vision.  Respiratory: Negative for cough and shortness of breath.    Cardiovascular: Negative for palpitations and leg swelling.  Gastrointestinal: Positive for constipation.  Genitourinary: Negative for dysuria and hematuria.  Musculoskeletal: Negative for myalgias and joint pain.  Skin: Negative for rash.  Neurological: Negative for dizziness and headaches  Past Medical History  Diagnosis Date  . Hypertension    Past Surgical History  Procedure Laterality Date  . Cardiac catheterization N/A 01/08/2015    Procedure: Left Heart Cath and Coronary Angiography;  Surgeon: Troy Sine, MD;  Location: [facility];  Service: Cardiovascular;  Laterality: N/A;  . Cardiac catheterization  01/08/2015    Procedure: Coronary Stent Intervention;  Surgeon: Troy Sine, MD;  Location: [facility];  Service: Cardiovascular;;  . Cardiac catheterization  01/08/2015    Procedure: Coronary Balloon Angioplasty;  Surgeon: Troy Sine, MD;  Location: [facility];  Service: Cardiovascular;;  . Electrophysiologic study  01/08/2015    Procedure: Cardioversion;  Surgeon: Troy Sine, MD;  Location: [facility];  Service: Cardiovascular;;   Family History  Problem Relation Age of Onset  . Hypertension Father    Social History:  has no tobacco, alcohol, and drug history on file. Allergies: No Known Allergies No prescriptions prior to admission    Home: Home Living Family/patient expects to be discharged to:: Private residence Living Arrangements: Spouse/significant other, Children Available Help at Discharge: Family, Available 24 hours/day Type of Home: House Home Access: Stairs to enter Technical brewer of Steps: 3 Home Layout: One level Home Equipment: None Additional Comments: pt works in International aid/development worker History: Prior Function Level of Independence: Independent  Functional Status:  Mobility: Bed Mobility Overal bed mobility: Needs Assistance Bed Mobility: Rolling, Sidelying to Sit Rolling: Mod  assist Sidelying to sit: Max assist General bed mobility comments: in chair beginning and end of session Transfers Overall transfer level: Needs assistance Transfers: Sit to/from Stand,  Squat Pivot Transfers Sit to Stand: Min assist Squat pivot transfers: Max assist, +2 physical assistance General transfer comment: over 4 trials pt progressed from min assist to minguard with cues for hand placement, scooting and safety. Posterior lean with first 3 trials on initial standing Ambulation/Gait Ambulation/Gait assistance: Min assist, +2 safety/equipment Ambulation Distance (Feet): 54 Feet Assistive device: Rolling walker (2 wheeled) General Gait Details: Pt performed 4 gait trials of 24', 36', 50', 54' respectively with seated rest between each trial. Pt with increased sway and utlization of right quadratus lumborum for gait with scissoring with gait. Cues for posture, position in RW and looking up Gait Pattern/deviations: Step-through pattern, Decreased stride length, Narrow base of support Gait velocity interpretation: Below normal speed for age/gender    ADL: ADL Overall ADL's : Needs assistance/impaired Eating/Feeding: NPO Grooming: Wash/dry hands, Wash/dry face, Oral care, Moderate assistance, Sitting Upper Body Bathing: Maximal assistance, Sitting Lower Body Bathing: Total assistance, Bed level Upper Body Dressing : Moderate assistance, Sitting Lower Body Dressing: Total assistance, +2 for physical assistance, Bed level Toilet Transfer: +2 for safety/equipment, Maximal assistance, Squat-pivot Toileting- Clothing Manipulation and Hygiene: +2 for physical assistance, Total assistance  Cognition: Cognition Overall Cognitive Status: Within Functional Limits for tasks assessed Orientation Level: Oriented to person, Oriented to place, Oriented to time, Disoriented to situation Cognition Arousal/Alertness: Awake/alert Behavior During Therapy: WFL for tasks assessed/performed Overall  Cognitive Status: Within Functional Limits for tasks assessed Area of Impairment: Orientation, Following commands, Safety/judgement, Problem solving Orientation Level: Disoriented to, Time, Situation, Place Memory: Decreased short-term memory Following Commands: Follows one step commands with increased time, Follows one step commands inconsistently Safety/Judgement: Decreased awareness of safety, Decreased awareness of deficits Problem Solving: Slow processing, Requires tactile cues, Requires verbal cues General Comments: Pt thought he was in [employer]. Attempting to stand stating he wanted to walk.  Physical Exam: Blood pressure 116/68, pulse 81, temperature 98.6 F (37 C), temperature source Oral, resp. rate 16, height 5' 7"  (1.702 m), weight 93.21 kg (205 lb 7.9 oz), SpO2 98 %. Physical Exam Constitutional: He appears well-developed. No disress HENT: dentition fair Head: Normocephalic.  Eyes: EOM are normal.  Neck: Normal range of motion. Neck supple. No thyromegaly present.  Cardiovascular: Normal rate and regular rhythm.  Respiratory: Effort normal and breath sounds normal. No respiratory distress.  GI: Soft. Bowel sounds are normal. He exhibits no distension.  Neurological: He is alert.  Limited English. He was able to follow simple commands and answer simple yes no questions. UE 4/5 prox to distal. LE: 2/5 hf, 3/5ke, 4/5 ankles, some pain limitations  Skin: Lateral three toes on left foot are starting to demarcate. He has pedal pulses with tips of toes significant ischemic changes, especially lateral 2. Toes are tender to touch. Some bruising over the knees/shins Psychiatric: He has a normal mood and affect. His behavior is normal  Results for orders placed or performed during the hospital encounter of 01/08/15 (from the past 48 hour(s))  Glucose, capillary     Status: Abnormal   Collection Time: 01/21/15  1:08 PM  Result Value Ref Range   Glucose-Capillary 133 (H) 65 - 99  mg/dL   Comment 1 Capillary Specimen   Glucose, capillary     Status: Abnormal   Collection Time: 01/21/15  6:03 PM  Result Value Ref Range   Glucose-Capillary 113 (H) 65 - 99 mg/dL   Comment 1 Capillary Specimen   Glucose, capillary     Status: Abnormal   Collection Time: 01/21/15  7:33 PM  Result Value Ref Range   Glucose-Capillary 123 (H) 65 - 99 mg/dL  Glucose, capillary     Status: Abnormal   Collection Time: 01/21/15 11:06 PM  Result Value Ref Range   Glucose-Capillary 110 (H) 65 - 99 mg/dL   Comment 1 Capillary Specimen   Glucose, capillary     Status: None   Collection Time: 01/22/15  3:27 AM  Result Value Ref Range   Glucose-Capillary 93 65 - 99 mg/dL  Glucose, capillary     Status: Abnormal   Collection Time: 01/22/15  7:35 AM  Result Value Ref Range   Glucose-Capillary 100 (H) 65 - 99 mg/dL   Comment 1 Capillary Specimen   Glucose, capillary     Status: Abnormal   Collection Time: 01/22/15 11:18 AM  Result Value Ref Range   Glucose-Capillary 123 (H) 65 - 99 mg/dL   Comment 1 Capillary Specimen   Basic metabolic panel     Status: Abnormal   Collection Time: 01/22/15 12:30 PM  Result Value Ref Range   Sodium 148 (H) 135 - 145 mmol/L   Potassium 3.7 3.5 - 5.1 mmol/L   Chloride 118 (H) 101 - 111 mmol/L   CO2 21 (L) 22 - 32 mmol/L   Glucose, Bld 121 (H) 65 - 99 mg/dL   BUN 41 (H) 6 - 20 mg/dL   Creatinine, Ser 1.22 0.61 - 1.24 mg/dL   Calcium 8.5 (L) 8.9 - 10.3 mg/dL   GFR calc non Af Amer >60 >60 mL/min   GFR calc Af Amer >60 >60 mL/min    Comment: (NOTE) The eGFR has been calculated using the CKD EPI equation. This calculation has not been validated in all clinical situations. eGFR's persistently <60 mL/min signify possible Chronic Kidney Disease.    Anion gap 9 5 - 15  Glucose, capillary     Status: Abnormal   Collection Time: 01/22/15  4:21 PM  Result Value Ref Range   Glucose-Capillary 102 (H) 65 - 99 mg/dL   Comment 1 Capillary Specimen   Glucose,  capillary     Status: Abnormal   Collection Time: 01/22/15  8:13 PM  Result Value Ref Range   Glucose-Capillary 132 (H) 65 - 99 mg/dL  Glucose, capillary     Status: Abnormal   Collection Time: 01/23/15 12:14 AM  Result Value Ref Range   Glucose-Capillary 113 (H) 65 - 99 mg/dL  Comprehensive metabolic panel     Status: Abnormal   Collection Time: 01/23/15  2:50 AM  Result Value Ref Range   Sodium 149 (H) 135 - 145 mmol/L   Potassium 4.0 3.5 - 5.1 mmol/L   Chloride 119 (H) 101 - 111 mmol/L   CO2 20 (L) 22 - 32 mmol/L   Glucose, Bld 111 (H) 65 - 99 mg/dL   BUN 45 (H) 6 - 20 mg/dL   Creatinine, Ser 1.07 0.61 - 1.24 mg/dL   Calcium 8.9 8.9 - 10.3 mg/dL   Total Protein 6.6 6.5 - 8.1 g/dL   Albumin 2.5 (L) 3.5 - 5.0 g/dL   AST 67 (H) 15 - 41 U/L   ALT 80 (H) 17 - 63 U/L   Alkaline Phosphatase 112 38 - 126 U/L   Total Bilirubin 1.6 (H) 0.3 - 1.2 mg/dL   GFR calc non Af Amer >60 >60 mL/min   GFR calc Af Amer >60 >60 mL/min    Comment: (NOTE) The eGFR has been calculated using the CKD EPI equation. This calculation has  not been validated in all clinical situations. eGFR's persistently <60 mL/min signify possible Chronic Kidney Disease.    Anion gap 10 5 - 15  Magnesium     Status: None   Collection Time: 01/23/15  2:50 AM  Result Value Ref Range   Magnesium 2.4 1.7 - 2.4 mg/dL  CBC with Differential/Platelet     Status: Abnormal   Collection Time: 01/23/15  2:50 AM  Result Value Ref Range   WBC 11.3 (H) 4.0 - 10.5 K/uL   RBC 3.60 (L) 4.22 - 5.81 MIL/uL   Hemoglobin 10.0 (L) 13.0 - 17.0 g/dL   HCT 33.0 (L) 39.0 - 52.0 %   MCV 91.7 78.0 - 100.0 fL   MCH 27.8 26.0 - 34.0 pg   MCHC 30.3 30.0 - 36.0 g/dL   RDW 17.2 (H) 11.5 - 15.5 %   Platelets 749 (H) 150 - 400 K/uL   Neutrophils Relative % 82 (H) 43 - 77 %   Neutro Abs 9.2 (H) 1.7 - 7.7 K/uL   Lymphocytes Relative 9 (L) 12 - 46 %   Lymphs Abs 1.0 0.7 - 4.0 K/uL   Monocytes Relative 7 3 - 12 %   Monocytes Absolute 0.8 0.1 -  1.0 K/uL   Eosinophils Relative 2 0 - 5 %   Eosinophils Absolute 0.2 0.0 - 0.7 K/uL   Basophils Relative 0 0 - 1 %   Basophils Absolute 0.0 0.0 - 0.1 K/uL  Glucose, capillary     Status: Abnormal   Collection Time: 01/23/15  3:51 AM  Result Value Ref Range   Glucose-Capillary 102 (H) 65 - 99 mg/dL   Comment 1 Capillary Specimen   Glucose, capillary     Status: None   Collection Time: 01/23/15  7:49 AM  Result Value Ref Range   Glucose-Capillary 99 65 - 99 mg/dL   Comment 1 Capillary Specimen    No results found.     Medical Problem List and Plan: 1. Functional deficits secondary to debilitation after cardiac arrest status post stenting/potential anoxic encephalopathy 2.  DVT Prophylaxis/Anticoagulation: Subcutaneous Lovenox. Monitor platelet counts of any signs of bleeding 3. Pain Management: Tylenol as needed 4. Acute renal insufficiency. Creatinine rebounding to 1.07. Follow-up chemistries 5. Neuropsych: This patient is capable of making decisions on his own behalf. 6. Skin/Wound Care: Routine skin checks 7. Fluids/Electrolytes/Nutrition: Strict I&O with follow-up chemistries 8. Gangrenous ischemic toes. Follow-up vascular surgery. Significant demarcation on the left. 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 MSSA pneumonia. Patient has completed course of Ancef 10. Hypertension. Norvasc 5 mg daily, Coreg 18.75 mg twice a day, lisinopril 10 mg daily, Aldactone 12.5 mg daily. Monitor with increased mobility   Post Admission Physician Evaluation: 1. Functional deficits secondary  to debility/anoxic BI after cardiac arrest. 2. Patient is admitted to receive collaborative, interdisciplinary care between the physiatrist, rehab nursing staff, and therapy team. 3. Patient's level of medical complexity and substantial therapy needs in context of that medical necessity cannot be provided at a lesser intensity of care such as a SNF. 4. Patient has experienced substantial functional loss from his/her  baseline which was documented above under the "Functional History" and "Functional Status" headings.  Judging by the patient's diagnosis, physical exam, and functional history, the patient has potential for functional progress which will result in measurable gains while on inpatient rehab.  These gains will be of substantial and practical use upon discharge  in facilitating mobility and self-care at the household level. 5. Physiatrist will provide 24  hour management of medical needs as well as oversight of the therapy plan/treatment and provide guidance as appropriate regarding the interaction of the two. 6. 24 hour rehab nursing will assist with bladder management, bowel management, safety, skin/wound care, disease management, medication administration, pain management and patient education  and help integrate therapy concepts, techniques,education, etc. 7. PT will assess and treat for/with: Lower extremity strength, range of motion, stamina, balance, functional mobility, safety, adaptive techniques and equipment, NMR, pain mgt, weight bearing, family education.   Goals are: supervision. 8. OT will assess and treat for/with: ADL's, functional mobility, safety, upper extremity strength, adaptive techniques and equipment, NMR, pain mgt, family ed, ego support.   Goals are: supervision. Therapy may proceed with showering this patient. 9. SLP will assess and treat for/with: cognition, communication.  Goals are: mod I. 10. Case Management and Social Worker will assess and treat for psychological issues and discharge planning. 11. Team conference will be held weekly to assess progress toward goals and to determine barriers to discharge. 12. Patient will receive at least 3 hours of therapy per day at least 5 days per week. 13. ELOS: 7-12 days       14. Prognosis:  excellent     Meredith Staggers, MD, [facility] 01/24/2015   01/23/2015

## 2015-01-23 NOTE — Progress Notes (Signed)
       Patient Name: Alexander Berry Date of Encounter: 01/23/2015    SUBJECTIVE: Doing well. Concerned about feet. Ambulated. No chest discomfort or dyspnea.  TELEMETRY:  Normal sinus rhythm Filed Vitals:   01/23/15 0002 01/23/15 0352 01/23/15 0750 01/23/15 1004  BP: 96/31 142/79 123/80   Pulse: 78 74  107  Temp: 98 F (36.7 C) 98.8 F (37.1 C) 98.8 F (37.1 C)   TempSrc: Oral Oral Oral   Resp: 20 19 15    Height:      Weight:      SpO2: 100% 100% 100%     Intake/Output Summary (Last 24 hours) at 01/23/15 1048 Last data filed at 01/23/15 1000  Gross per 24 hour  Intake 1531.67 ml  Output    500 ml  Net 1031.67 ml   LABS: Basic Metabolic Panel:  Recent Labs  62/26/33 1230 01/23/15 0250  NA 148* 149*  K 3.7 4.0  CL 118* 119*  CO2 21* 20*  GLUCOSE 121* 111*  BUN 41* 45*  CREATININE 1.22 1.07  CALCIUM 8.5* 8.9  MG  --  2.4   CBC:  Recent Labs  01/21/15 0330 01/23/15 0250  WBC 13.6* 11.3*  NEUTROABS 11.9* 9.2*  HGB 9.4* 10.0*  HCT 28.8* 33.0*  MCV 87.3 91.7  PLT 531* 749*   Radiology/Studies:  No recent  Physical Exam: Blood pressure 123/80, pulse 107, temperature 98.8 F (37.1 C), temperature source Oral, resp. rate 15, height 5\' 7"  (1.702 m), weight 93.21 kg (205 lb 7.9 oz), SpO2 100 %. Weight change:   Wt Readings from Last 3 Encounters:  01/14/15 93.21 kg (205 lb 7.9 oz)    Clear chest Cardiac exam without gallop or rub Extremities reveal left foot with ischemic toes.  ASSESSMENT:  1. VF arrest with successful resuscitation in the setting of large inferior lateral myocardial infarction. 2. Ischemic toes left foot 3. Anoxic encephalopathy  Plan:  Continue progression with PT/OT and will require CIR  Signed, Lesleigh Noe 01/23/2015, 10:48 AM

## 2015-01-23 NOTE — Consult Note (Signed)
Physical Medicine and Rehabilitation Consult Reason for Consult: Deconditioning/STEMI/cardiogenic shock Referring Physician: [facility]ad   HPI: Alexander Berry is a 49 y.o. right handed male with history of hypertension. Admitted 01/08/2015 after witnessed out of hospital cardiac arrest. He was playing soccer when he suddenly collapsed. Bystander CPR was performed. EMS arrival patient was V. fib. He received epinephrine and amiodarone. He was intubated in the field. EKG showed global ST elevations. Underwent emergent cardiac catheterization with stenting. Echocardiogram with ejection fraction of 60% and mild posterior wall hypokinesis. 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 Cranial CT scan negative. Hospital course respiratory failure as well as acute renal failure creatinine 2.98 with follow-up critical care medicine. Patient self extubated 01/12/2015. Developed persistent fevers suspect sepsis maintained on broad-spectrum antibodies. Right upper quadrant ultrasound showed biliary sludge no obstruction. Subcutaneous Lovenox for DVT prophylaxis. Dysphagia #2 nectar thick liquid diet. Vascular surgery consulted for ischemic toes with plan ABIs and workup ongoing. Renal function has improved creatinine 1.07. Physical therapy evaluation completed with recommendations of physical medicine rehabilitation consult.   Review of Systems  Constitutional: Positive for fever. Negative for malaise/fatigue.  Eyes: Negative for blurred vision and double vision.  Respiratory: Negative for cough and shortness of breath.   Cardiovascular: Negative for palpitations and leg swelling.  Gastrointestinal: Positive for constipation.  Genitourinary: Negative for dysuria and hematuria.  Musculoskeletal: Negative for myalgias and joint pain.  Skin: Negative for rash.  Neurological: Negative for dizziness and headaches.   Past Medical History   Diagnosis Date  . Hypertension    Past Surgical History  Procedure Laterality Date  . Cardiac catheterization N/A 01/08/2015    Procedure: Left Heart Cath and Coronary Angiography;  Surgeon: Lennette Bihari, MD;  Location: [facility] INVASIVE CV LAB;  Service: Cardiovascular;  Laterality: N/A;  . Cardiac catheterization  01/08/2015    Procedure: Coronary Stent Intervention;  Surgeon: Lennette Bihari, MD;  Location: [facility] INVASIVE CV LAB;  Service: Cardiovascular;;  . Cardiac catheterization  01/08/2015    Procedure: Coronary Balloon Angioplasty;  Surgeon: Lennette Bihari, MD;  Location: [facility] INVASIVE CV LAB;  Service: Cardiovascular;;  . Electrophysiologic study  01/08/2015    Procedure: Cardioversion;  Surgeon: Lennette Bihari, MD;  Location: [facility] INVASIVE CV LAB;  Service: Cardiovascular;;   Family History  Problem Relation Age of Onset  . Hypertension Father    Social History:  has no tobacco, alcohol, and drug history on file. 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 History: Prior Function Level of Independence: Independent Functional Status:  Mobility: Bed Mobility Overal bed mobility: Needs Assistance Bed Mobility: Rolling, Sidelying to Sit Rolling: Mod assist Sidelying to sit: Max assist General bed mobility comments: not assessed, pt in chair, returned to chair Transfers Overall transfer level: Needs assistance Transfers: Sit to/from Stand, Squat Pivot Transfers Sit to Stand: +2 physical assistance, Max assist Squat pivot transfers: Max assist, +2 physical assistance General  transfer comment: stood from recliner x 1, assisted with pad and gait belt with knees blocked      ADL: ADL Overall ADL's : Needs assistance/impaired Eating/Feeding: NPO  Grooming: Wash/dry hands, Wash/dry face, Oral care, Moderate assistance, Sitting Upper Body Bathing: Maximal assistance, Sitting Lower Body Bathing: Total assistance, Bed level Upper Body Dressing : Moderate assistance, Sitting Lower Body Dressing: Total assistance, +2 for physical assistance, Bed level Toilet Transfer: +2 for safety/equipment, Maximal assistance, Squat-pivot Toileting- Clothing Manipulation and Hygiene: +2 for physical assistance, Total assistance  Cognition: Cognition Overall Cognitive Status: Impaired/Different from baseline Orientation Level: Oriented to person, Oriented to place, Oriented to time, Disoriented to situation Cognition Arousal/Alertness: Awake/alert Behavior During Therapy: Impulsive Overall Cognitive Status: Impaired/Different from baseline Area of Impairment: Orientation, Following commands, Safety/judgement, Problem solving Orientation Level: Disoriented to, Time, Situation, Place Memory: Decreased short-term memory Following Commands: Follows one step commands with increased time, Follows one step commands inconsistently Safety/Judgement: Decreased awareness of safety, Decreased awareness of deficits Problem Solving: Slow processing, Requires tactile cues, Requires verbal cues General Comments: Pt thought he was in [employer]. Attempting to stand stating he wanted to walk.  Blood pressure 142/79, pulse 74, temperature 98.8 F (37.1 C), temperature source Oral, resp. rate 19, height  (1.702 m), weight 93.21 kg (205 lb 7.9 oz), SpO2 100 %. Physical Exam  Constitutional: He appears well-developed.  HENT:  Head: Normocephalic.  Eyes: EOM are normal.  Neck: Normal range of motion. Neck supple. No thyromegaly present.  Cardiovascular: Normal rate and regular rhythm.     Respiratory: Effort normal and breath sounds normal. No respiratory distress.  GI: Soft. Bowel sounds are normal. He exhibits no distension.  Neurological: He is alert.  Limited English speaking. He was able to follow simple commands and answer simple yes no questions. Moves all 4's with proximal greater than distal weakness noted.   Skin: Skin is warm and dry.  Psychiatric: He has a normal mood and affect. His behavior is normal.    Results for orders placed or performed during the hospital encounter of 01/08/15 (from the past 24 hour(s))  Glucose, capillary     Status: Abnormal   Collection Time: 01/22/15  7:35 AM  Result Value Ref Range   Glucose-Capillary 100 (H) 65 - 99 mg/dL   Comment 1 Capillary Specimen   Glucose, capillary     Status: Abnormal   Collection Time: 01/22/15 11:18 AM  Result Value Ref Range   Glucose-Capillary 123 (H) 65 - 99 mg/dL   Comment 1 Capillary Specimen   Basic metabolic panel     Status: Abnormal   Collection Time: 01/22/15 12:30 PM  Result Value Ref Range   Sodium 148 (H) 135 - 145 mmol/L   Potassium 3.7 3.5 - 5.1 mmol/L   Chloride 118 (H) 101 - 111 mmol/L   CO2 21 (L) 22 - 32 mmol/L   Glucose, Bld 121 (H) 65 - 99 mg/dL   BUN 41 (H) 6 - 20 mg/dL   Creatinine, Ser 4.09 0.61 - 1.24 mg/dL   Calcium 8.5 (L) 8.9 - 10.3 mg/dL   GFR calc non Af Amer >60 >60 mL/min   GFR calc Af Amer >60 >60 mL/min   Anion gap 9 5 - 15  Glucose, capillary     Status: Abnormal   Collection Time: 01/22/15  4:21 PM  Result Value Ref Range   Glucose-Capillary 102 (H) 65 - 99 mg/dL   Comment 1 Capillary Specimen   Glucose, capillary     Status: Abnormal   Collection Time: 01/22/15  8:13 PM  Result Value Ref Range   Glucose-Capillary 132 (H) 65 - 99 mg/dL  Glucose, capillary  Status: Abnormal   Collection Time: 01/23/15 12:14 AM  Result Value Ref Range   Glucose-Capillary 113 (H) 65 - 99 mg/dL  Comprehensive metabolic panel     Status: Abnormal   Collection Time:  01/23/15  2:50 AM  Result Value Ref Range   Sodium 149 (H) 135 - 145 mmol/L   Potassium 4.0 3.5 - 5.1 mmol/L   Chloride 119 (H) 101 - 111 mmol/L   CO2 20 (L) 22 - 32 mmol/L   Glucose, Bld 111 (H) 65 - 99 mg/dL   BUN 45 (H) 6 - 20 mg/dL   Creatinine, Ser 2.44 0.61 - 1.24 mg/dL   Calcium 8.9 8.9 - 62.8 mg/dL   Total Protein 6.6 6.5 - 8.1 g/dL   Albumin 2.5 (L) 3.5 - 5.0 g/dL   AST 67 (H) 15 - 41 U/L   ALT 80 (H) 17 - 63 U/L   Alkaline Phosphatase 112 38 - 126 U/L   Total Bilirubin 1.6 (H) 0.3 - 1.2 mg/dL   GFR calc non Af Amer >60 >60 mL/min   GFR calc Af Amer >60 >60 mL/min   Anion gap 10 5 - 15  Magnesium     Status: None   Collection Time: 01/23/15  2:50 AM  Result Value Ref Range   Magnesium 2.4 1.7 - 2.4 mg/dL  CBC with Differential/Platelet     Status: Abnormal   Collection Time: 01/23/15  2:50 AM  Result Value Ref Range   WBC 11.3 (H) 4.0 - 10.5 K/uL   RBC 3.60 (L) 4.22 - 5.81 MIL/uL   Hemoglobin 10.0 (L) 13.0 - 17.0 g/dL   HCT 63.8 (L) 17.7 - 11.6 %   MCV 91.7 78.0 - 100.0 fL   MCH 27.8 26.0 - 34.0 pg   MCHC 30.3 30.0 - 36.0 g/dL   RDW 57.9 (H) 03.8 - 33.3 %   Platelets 749 (H) 150 - 400 K/uL   Neutrophils Relative % 82 (H) 43 - 77 %   Neutro Abs 9.2 (H) 1.7 - 7.7 K/uL   Lymphocytes Relative 9 (L) 12 - 46 %   Lymphs Abs 1.0 0.7 - 4.0 K/uL   Monocytes Relative 7 3 - 12 %   Monocytes Absolute 0.8 0.1 - 1.0 K/uL   Eosinophils Relative 2 0 - 5 %   Eosinophils Absolute 0.2 0.0 - 0.7 K/uL   Basophils Relative 0 0 - 1 %   Basophils Absolute 0.0 0.0 - 0.1 K/uL  Glucose, capillary     Status: Abnormal   Collection Time: 01/23/15  3:51 AM  Result Value Ref Range   Glucose-Capillary 102 (H) 65 - 99 mg/dL   Comment 1 Capillary Specimen    Dg Swallowing Func-speech Pathology  01/21/2015    Objective Swallowing Evaluation:    Patient Details  Name: Sayvion Dema MRN: [id number] Date of Birth: 1965/11/17  Today's Date: 01/21/2015 Time: SLP Start Time (ACUTE ONLY): 1119-SLP  Stop Time (ACUTE ONLY): 1136 SLP Time Calculation (min) (ACUTE ONLY): 17 min  Past Medical History:  Past Medical History  Diagnosis Date  . Hypertension    Past Surgical History:  Past Surgical History  Procedure Laterality Date  . Cardiac catheterization N/A 01/08/2015    Procedure: Left Heart Cath and Coronary Angiography;  Surgeon: Lennette Bihari, MD;  Location: [facility] INVASIVE CV LAB;  Service: Cardiovascular;   Laterality: N/A;  . Cardiac catheterization  01/08/2015    Procedure: Coronary Stent Intervention;  Surgeon: Lennette Bihari, MD;  Location: [facility] INVASIVE CV LAB;  Service: Cardiovascular;;  . Cardiac catheterization  01/08/2015    Procedure: Coronary Balloon Angioplasty;  Surgeon: Lennette Bihari, MD;   Location: [facility] INVASIVE CV LAB;  Service: Cardiovascular;;  . Electrophysiologic study  01/08/2015    Procedure: Cardioversion;  Surgeon: Lennette Bihari, MD;  Location: [facility]  INVASIVE CV LAB;  Service: Cardiovascular;;   HPI:  Other Pertinent Information: 49 y/o male with no prior cardiac history and  HTN admitted after cardiac arrest while playing soccer. Intubated 6/5,  self extubated 6/9, re-intubated 6/9-6/16, stent placement, ARDS. CXDR  Persistent unchanged diffuse bilateral pulmonary infiltrates.  No Data Recorded  Assessment / Plan / Recommendation CHL IP CLINICAL IMPRESSIONS 01/21/2015  Therapy Diagnosis Mild oral phase dysphagia;Mild pharyngeal phase  dysphagia  Clinical Impression Pt presenting with acute reversible oral pharyngeal  dysphagia 2 days post 12 day intubation period. Pt appears slightly  lethargic with mild weakness. Pt with delayed swallow with all  consistencies triggering at the level of the valleculae.  Silent  aspiration seen x1 following thin liquid by cup. Cueing for volitional  cough did not clear the airway of the aspirates. Additional thin liquid  trials noted flash penetration.  Chin tuck maneuver instructed with the  use of the patients son for translation, however pt unable to follow   commands for adequate implementation. Pt with better airway protection of  nectar thick liquids with no penetration or aspiraiton evidenced  throughout the study. Fatigue seen with regular textures including  prolonged mastication and piece meal deglutiton. Recommend dysphagia 2  diet with nectar thick liquids. Medicines whole with puree.  Anticipate as  mentation and strenght improve pt will safely tolerate upgraded diet  trials. ST follow up warranted.          CHL IP TREATMENT RECOMMENDATION 01/21/2015  Treatment Recommendations Therapy as outlined in treatment plan below     CHL IP DIET RECOMMENDATION 01/21/2015  SLP Diet Recommendations Nectar;Dysphagia 2 (Fine chop)  Liquid Administration via (None)  Medication Administration Whole meds with puree  Compensations Slow rate;Small sips/bites;Follow solids with  liquid;Minimize environmental distractions  Postural Changes and/or Swallow Maneuvers (None)     CHL IP OTHER RECOMMENDATIONS 01/21/2015  Recommended Consults (None)  Oral Care Recommendations Oral care BID  Other Recommendations Order thickener from pharmacy     No flowsheet data found.   CHL IP FREQUENCY AND DURATION 01/21/2015  Speech Therapy Frequency (ACUTE ONLY) min 2x/week  Treatment Duration 2 weeks     Pertinent Vitals/Pain     SLP Swallow Goals No flowsheet data found.  No flowsheet data found.    CHL IP REASON FOR REFERRAL 01/21/2015  Reason for Referral Objectively evaluate swallowing function                 Marcene Duos MA, CCC-SLP Acute Care Speech Language Pathologist    Kennieth Rad 01/21/2015, 12:04 PM     Assessment/Plan: Diagnosis: debility after cardiac arrest, potential anoxic encephalopathy 1. Does the need for close, 24 hr/day medical supervision in concert with the patient's rehab needs make it unreasonable for this patient to be served in a less intensive setting? Yes 2. Co-Morbidities requiring supervision/potential complications: AKI,  3. Due to bladder management, bowel  management, safety, skin/wound care, disease management, medication administration, pain management and patient education, does the patient require 24 hr/day rehab nursing? Yes 4. Does the patient require coordinated care of a physician, rehab nurse, PT (1-2 hrs/day, 5 days/week), OT (1-2  hrs/day, 5 days/week) and SLP (1-2 hrs/day, 5 days/week) to address physical and functional deficits in the context of the above medical diagnosis(es)? Yes Addressing deficits in the following areas: balance, endurance, locomotion, strength, transferring, bowel/bladder control, bathing, dressing, feeding, grooming, toileting, cognition and psychosocial support 5. Can the patient actively participate in an intensive therapy program of at least 3 hrs of therapy per day at least 5 days per week? Yes 6. The potential for patient to make measurable gains while on inpatient rehab is excellent 7. Anticipated functional outcomes upon discharge from inpatient rehab are supervision  with PT, supervision with OT, modified independent and supervision with SLP. 8. Estimated rehab length of stay to reach the above functional goals is: 15-18 days 9. Does the patient have adequate social supports and living environment to accommodate these discharge functional goals? Yes 10. Anticipated D/C setting: Home 11. Anticipated post D/C treatments: HH therapy and Outpatient therapy 12. Overall Rehab/Functional Prognosis: good  RECOMMENDATIONS: This patient's condition is appropriate for continued rehabilitative care in the following setting: CIR Patient has agreed to participate in recommended program. Potentially Note that insurance prior authorization may be required for reimbursement for recommended care.  Comment: Rehab Admissions Coordinator to follow up.  Thanks,  Ranelle Oyster, MD, [place]     01/23/2015

## 2015-01-23 NOTE — Progress Notes (Signed)
[place] TEAM 1 - Stepdown/ICU TEAM Progress Note  Alexander Berry ZOX:[id number] DOB: 1966-01-05 DOA: 01/08/2015 PCP: No PCP Per Patient  Admit HPI / Brief Narrative: 49 y/o M w/ no signif known past medical hx who presented to [facility] by EMS as a witnessed out-of hospital cardiac arrest. He was playing soccer when he had sudden syncope and collapse. Bystander CPR was preformed. On EMS arrival, he was in Vfib. CPR was continued. He was given Epi x 9 and Amiodarone x 2. He was intubated in the field. EKG noted global ST elevations. He was taken urgently to the cath lab.   HPI/Subjective: Pt c/o some modest musculoskeletal chest pain, but denies sob, f/c, n/v, or abdom pain.  His voice is somewhat hoarse.    Assessment/Plan:  Acute STEMI (large inferior lateral myocardial infarction) 6/05 / VF arrest 6/05  -s/p PCI/DES to mid RCA occlusion  -In and out since admission -8.2 L   Cardiogenic shock -resolved  Hypertension -Cards following - blood pressure control improved today  Septic shock  -Multifactorial l to include MSSA pneumonia, ARDS, and gangrenous toe -resolved 6/11 -Vascular consulted for gangrene toes - outpt FU  Gangrenous left toes -Patient third-fifth left toes gangrenous; evaluated by Vascular Surgery who would like for patient to follow-up as an outpatient -Counseled patient that acutely this problem would not be addressed secondary to his cardiac arrest -Has palpable pedal pulses  Acute on chronic renal failure -Cr 2.15 on admission - creatinine has normalized and is presently stable  Hypokalemia  -Potassium is at goal of 4.0 or greater - magnesium is normal  Hypernatremia -The patient's sodium is climbing despite free water via IV - push oral liquids - increased free water via IV - follow trend - appears to be due to simple dehydration  Shock liver -Biliary sludge by RUQ [place] 6/10 -Recheck LFTs in a.m.  Hyperbilirubinemia -Recheck in a.m.  Dysphagia  -Has now  been cleared for regular diet with thin liquids  Anemia without overt blood loss -Hemoglobin improving - no gross blood loss  Anoxic encephalopathy -Slowly improving -MRI WNL   MSSA pneumonia -Has completed a full treatment course - no clinical signs/symptoms to suggest ongoing pulmonary infection - discontinue antibiotics and follow  Code Status: FULL Family Communication: no family present at time of exam Disposition Plan: Keep on Team 1 for expected transfer to CIR in 24-48 hours - follow sodium - push oral liquids - follow LFTs  Consultants: Dr. Bevelyn Buckles Bensimhon (heart failure team) Dr. Chuck Hint (vascular surgery) Dr. Cyril Mourning (PCCM)  Procedure/Significant Events: 6/05 Admitted after OOH arrest 6/05 LHC: Dist RCA lesion, 80% stenosed. Mid RCA lesion, 100% stenosed. There is a 0% residual stenosis post intervention. A drug-eluting stent was placed. 2nd RPLB-1 lesion, 100% stenosed. 2nd RPLB-2 lesion, 100% stenosed. There is a 0% residual stenosis post intervention. RPDA lesion, 80% stenosed. There is a 0% residual stenosis post intervention.  6/06 EEG: Abnormal EEG due to the presence of a generalized slowing indicating a mild to moderate cerebral disturbance (encephalopathy). No epileptiform activity noted 6/06 TTE: The estimated ejection fraction was in the range of 55% to 60%. Mild posterior wall hypokinesis - decreased Definity microsphere uptake in the posterior wall suggests hypoperfusion  6/07 Rewarmed. Continuous sedatives stopped. RASS -5. No spont movement 6/07 CT head: NAD 6/07 repeat EEG: Abnormal EEG due to the presence of a generalized slowing indicating a mild to moderate cerebral disturbance (encephalopathy). No epileptiform activity noted 6/08 Increased agitation  requiring sedation. Not F/C. Increased O2 reqts. Frothy pink sputum. Resp culture obatined. PCT elevated. Empiric abx initiated. CXR c/w increased pulm edema. Hypertensive.  Furosemide ordered 6/08 repeat limited TTE: LVEF 50%. Inferior wall AK 6/09 Intermittently F/C. RASS -2 to +1. Requiring 70% O2. Dexmedetomidine initiated 6/09 PM: self extubated, required immediate re-intubation. High fevers - cooling blanket initiated. Hypotension - NE initiated 6/10 High fevers persist. Arctic sun device re-ordered to maintain T < 99.5. PCT remained elevated. Abx adjusted. Cr rising. New dx of severe sepsis/septic shock rendered. Elevated bilirubin  6/10 RUQ [place]: biliary sludge. No obstruction 6/11 Off vasopressors. Fevers controlled. Cr improving. No WUA due to increasing agitation 6/12 ARDS persists. AKI improving. Worsening hypernatremia - D5W initiated  Antibiotics: None  DVT prophylaxis: Lovenox  Objective: Blood pressure 115/57, pulse 69, temperature 98.6 F (37 C), temperature source Oral, resp. rate 13, height  (1.702 m), weight 93.21 kg (205 lb 7.9 oz), SpO2 97 %.  Intake/Output Summary (Last 24 hours) at 01/23/15 1406 Last data filed at 01/23/15 1300  Gross per 24 hour  Intake 1461.67 ml  Output      0 ml  Net 1461.67 ml   Exam: General: No acute respiratory distress - alert and conversant Lungs: Clear to auscultation bilaterally without wheezes or crackles Cardiovascular: Regular rate and rhythm without murmur gallop or rub normal S1 and S2 Abdomen: Nontender, nondistended, soft, bowel sounds positive, no rebound, no ascites, no appreciable mass Extremities: No significant clubbing, or edema bilateral lower extremities - ischemic toes of left foot  Data Reviewed: Basic Metabolic Panel:  Recent Labs Lab 01/17/15 0435 01/18/15 0430 01/19/15 0330 01/20/15 0345 01/21/15 0330 01/22/15 1230 01/23/15 0250  NA 151* 148* 143 145 145 148* 149*  K 3.6 3.7 3.6 3.6 3.1* 3.7 4.0  CL 114* 112* 101 106 110 118* 119*  CO2 21* 20*  GLUCOSE 104* 116* 110* 115* 113* 121* 111*  BUN 54* 46* 43* 42* 46* 41* 45*  CREATININE 1.56* 1.54*  1.29* 1.30* 1.30* 1.22 1.07  CALCIUM 8.3* 7.9* 7.9* 8.6* 8.6* 8.5* 8.9  MG 2.4 2.3 2.1 2.3  --   --  2.4  PHOS 3.5 3.8 5.1* 3.8  --   --   --    Liver Function Tests:  Recent Labs Lab 01/23/15 0250  AST 67*  ALT 80*  ALKPHOS 112  BILITOT 1.6*  PROT 6.6  ALBUMIN 2.5*   CBC:  Recent Labs Lab 01/18/15 0430 01/19/15 0330 01/20/15 0345 01/21/15 0330 01/23/15 0250  WBC 13.9* 15.3* 20.1* 13.6* 11.3*  NEUTROABS  --   --   --  11.9* 9.2*  HGB 7.3* 7.1* 9.6* 9.4* 10.0*  HCT 23.0* 22.3* 29.5* 28.8* 33.0*  MCV 87.5 87.1 87.0 87.3 91.7  PLT 160 207 391 531* 749*   Cardiac Enzymes:  Recent Labs Lab 01/20/15 0345  CKTOTAL 213  CKMB 5.5*   CBG:  Recent Labs Lab 01/22/15 2013 01/23/15 0014 01/23/15 0351 01/23/15 0749 01/23/15 1132  GLUCAP 132* 113* 102* 99 107*    Recent Results (from the past 240 hour(s))  Culture, blood (routine x 2)     Status: None (Preliminary result)   Collection Time: 01/17/15 10:12 PM  Result Value Ref Range Status   Specimen Description BLOOD RIGHT HAND  Final   Special Requests BOTTLES DRAWN AEROBIC ONLY 4.5CC PT ON ROCEPHIN  Final   Culture   Final  BLOOD CULTURE RECEIVED NO GROWTH TO DATE CULTURE WILL BE HELD FOR 5 DAYS BEFORE ISSUING A FINAL NEGATIVE REPORT Performed at [facility]    Report Status PENDING  Incomplete  Culture, blood (routine x 2)     Status: None (Preliminary result)   Collection Time: 01/17/15 10:15 PM  Result Value Ref Range Status   Specimen Description BLOOD RIGHT ARM  Final   Special Requests   Final    BOTTLES DRAWN AEROBIC AND ANAEROBIC 10CC EACH ROCEPHIN   Culture   Final           BLOOD CULTURE RECEIVED NO GROWTH TO DATE CULTURE WILL BE HELD FOR 5 DAYS BEFORE ISSUING A FINAL NEGATIVE REPORT Note: Culture results may be compromised due to an excessive volume of blood received in culture bottles. Performed at [facility]    Report Status PENDING  Incomplete      Studies:  Recent x-ray studies have been reviewed in detail by the Attending Physician  Scheduled Meds:  Scheduled Meds: . amLODipine  5 mg Oral Daily  . antiseptic oral rinse  7 mL Mouth Rinse BID  . aspirin  81 mg Per Tube Daily  . carvedilol  18.75 mg Oral BID WC  .  ceFAZolin (ANCEF) IV  2 g Intravenous 3 times per day  . enoxaparin (LOVENOX) injection  40 mg Subcutaneous Q24H  . famotidine  20 mg Per Tube Daily  . feeding supplement (GLUCERNA SHAKE)  237 mL Oral BID BM  . insulin aspart  0-15 Units Subcutaneous 6 times per day  . lisinopril  10 mg Oral Daily  . multivitamin with minerals  1 tablet Oral Daily  . spironolactone  12.5 mg Oral Daily  . ticagrelor  90 mg Per Tube BID    Time spent on care of this patient: 35 mins  Lonia Blood, MD [facility] For Consults/Admissions - Flow Manager - [phone] Office  [phone]  Contact MD directly via text page:      [email or web address]      password [facility]  01/23/2015, 2:06 PM   LOS: 15 days

## 2015-01-23 NOTE — Progress Notes (Signed)
Nutrition Follow-up  DOCUMENTATION CODES:  Obesity unspecified  INTERVENTION:  -MVI daily -Glucerna Shake po BID, each supplement provides 220 kcal and 10 grams of protein  NUTRITION DIAGNOSIS:  Increased nutrient needs related to wound healing as evidenced by estimated needs.  Ongoing  GOAL:  Patient will meet greater than or equal to 90% of their needs  Progressing  MONITOR:  PO intake, Supplement acceptance, Labs, Weight trends, I & O's, Skin  REASON FOR ASSESSMENT:  Consult Enteral/tube feeding initiation and management  ASSESSMENT: Pt admitted with VF arrest while playing soccer 6/5. S/P cath with drug-eluting stent.   Pt transferred to SDU. Pt using bedside commode at time of visit and will be getting bathed once off commode.   SLP evaluated and has been upgraded to a regular consistency diet with thin liquids; tolerating without difficulty. 50-75% meal completion per doc flowsheets.   Per RN, pt is very weak and awaiting CIR evaluation.   Vascular surgery following; left foot toes are starting to demarcate and may potentially "autoamputate".   Labs reviewed: Na: 149.   Height:  Ht Readings from Last 1 Encounters:  No data found for Ht    Weight:  Wt Readings from Last 1 Encounters:  01/14/15 205 lb 7.9 oz (93.21 kg)    Ideal Body Weight:  67.2 kg  Wt Readings from Last 10 Encounters:  01/14/15 205 lb 7.9 oz (93.21 kg)    BMI:  Body mass index is 32.18 kg/(m^2).  Estimated Nutritional Needs:  Kcal:  2300-2500  Protein:  134  Fluid:  > 2.3 L/day  Skin:  Wound (see comment) (burn to chest, gangrene toes per MD)  Diet Order:  Diet Heart Room service appropriate?: Yes; Fluid consistency:: Thin  EDUCATION NEEDS:  No education needs identified at this time   Intake/Output Summary (Last 24 hours) at 01/23/15 1018 Last data filed at 01/23/15 0700  Gross per 24 hour  Intake 1431.67 ml  Output    500 ml  Net 931.67 ml    Last BM:   01/23/15  Uvaldo Rybacki A. Mayford Knife, RD, LDN, CDE Pager: [phone] After hours Pager: [phone]

## 2015-01-23 NOTE — Progress Notes (Addendum)
ANTIBIOTIC CONSULT NOTE - Follow-up  Pharmacy Consult for Cefazolin Indication: MSSA pneumonia  No Known Allergies  Patient Measurements: Height:  (170.2 cm) Weight: 205 lb 7.9 oz (93.21 kg) IBW/kg (Calculated) : 66.1  Vital Signs: Temp: 98.8 F (37.1 C) (06/20 0750) Temp Source: Oral (06/20 0750) BP: 123/80 mmHg (06/20 0750) Pulse Rate: 74 (06/20 0352) Intake/Output from previous day: 06/19 0701 - 06/20 0700 In: 1551.7 [P.O.:840; I.V.:511.7; IV Piggyback:200] Out: 800 [Urine:800] Intake/Output from this shift:    Labs:  Recent Labs  01/21/15 0330 01/22/15 1230 01/23/15 0250  WBC 13.6*  --  11.3*  HGB 9.4*  --  10.0*  PLT 531*  --  749*  CREATININE 1.30* 1.22 1.07   Estimated Creatinine Clearance: 92.8 mL/min (by C-G formula based on Cr of 1.07). No results for input(s): VANCOTROUGH, VANCOPEAK, VANCORANDOM, GENTTROUGH, GENTPEAK, GENTRANDOM, TOBRATROUGH, TOBRAPEAK, TOBRARND, AMIKACINPEAK, AMIKACINTROU, AMIKACIN in the last 72 hours.   Microbiology: Recent Results (from the past 720 hour(s))  MRSA PCR Screening     Status: None   Collection Time: 01/09/15  9:45 AM  Result Value Ref Range Status   MRSA by PCR NEGATIVE NEGATIVE Final    Comment:        The GeneXpert MRSA Assay (FDA approved for NASAL specimens only), is one component of a comprehensive MRSA colonization surveillance program. It is not intended to diagnose MRSA infection nor to guide or monitor treatment for MRSA infections.   Culture, respiratory (NON-Expectorated)     Status: None   Collection Time: 01/11/15 11:44 AM  Result Value Ref Range Status   Specimen Description TRACHEAL ASPIRATE  Final   Special Requests NONE  Final   Gram Stain   Final    FEW WBC PRESENT,BOTH PMN AND MONONUCLEAR RARE SQUAMOUS EPITHELIAL CELLS PRESENT NO ORGANISMS SEEN Performed at [facility]    Culture   Final    ABUNDANT STAPHYLOCOCCUS AUREUS Note: RIFAMPIN AND GENTAMICIN SHOULD NOT BE USED  AS SINGLE DRUGS FOR TREATMENT OF STAPH INFECTIONS. Performed at [facility]    Report Status 01/14/2015 FINAL  Final   Organism ID, Bacteria STAPHYLOCOCCUS AUREUS  Final      Susceptibility   Staphylococcus aureus - MIC*    CLINDAMYCIN <=0.25 SENSITIVE Sensitive     ERYTHROMYCIN <=0.25 SENSITIVE Sensitive     GENTAMICIN <=0.5 SENSITIVE Sensitive     LEVOFLOXACIN <=0.12 SENSITIVE Sensitive     OXACILLIN 0.5 SENSITIVE Sensitive     PENICILLIN >=0.5 RESISTANT Resistant     RIFAMPIN <=0.5 SENSITIVE Sensitive     TRIMETH/SULFA <=10 SENSITIVE Sensitive     VANCOMYCIN 1 SENSITIVE Sensitive     TETRACYCLINE 2 SENSITIVE Sensitive     MOXIFLOXACIN <=0.25 SENSITIVE Sensitive     * ABUNDANT STAPHYLOCOCCUS AUREUS  Culture, blood (routine x 2)     Status: None   Collection Time: 01/13/15 12:13 AM  Result Value Ref Range Status   Specimen Description BLOOD RIGHT HAND  Final   Special Requests BOTTLES DRAWN AEROBIC ONLY 10CC  Final   Culture   Final    NO GROWTH 5 DAYS Performed at [facility]    Report Status 01/19/2015 FINAL  Final  Culture, blood (routine x 2)     Status: None   Collection Time: 01/13/15 12:22 AM  Result Value Ref Range Status   Specimen Description BLOOD LEFT ANTECUBITAL  Final   Special Requests BOTTLES DRAWN AEROBIC ONLY 10CC  Final   Culture   Final  NO GROWTH 5 DAYS Performed at [facility]    Report Status 01/19/2015 FINAL  Final  Culture, respiratory (NON-Expectorated)     Status: None   Collection Time: 01/13/15 10:10 AM  Result Value Ref Range Status   Specimen Description TRACHEAL ASPIRATE  Final   Special Requests NONE  Final   Gram Stain   Final    MODERATE WBC PRESENT,BOTH PMN AND MONONUCLEAR RARE SQUAMOUS EPITHELIAL CELLS PRESENT NO ORGANISMS SEEN Performed at [facility]    Culture   Final    MODERATE STAPHYLOCOCCUS AUREUS Note: RIFAMPIN AND GENTAMICIN SHOULD NOT BE USED AS SINGLE DRUGS FOR TREATMENT OF STAPH  INFECTIONS. Performed at [facility]    Report Status 01/15/2015 FINAL  Final   Organism ID, Bacteria STAPHYLOCOCCUS AUREUS  Final      Susceptibility   Staphylococcus aureus - MIC*    CLINDAMYCIN <=0.25 SENSITIVE Sensitive     ERYTHROMYCIN 0.5 SENSITIVE Sensitive     GENTAMICIN <=0.5 SENSITIVE Sensitive     LEVOFLOXACIN <=0.12 SENSITIVE Sensitive     OXACILLIN 0.5 SENSITIVE Sensitive     PENICILLIN >=0.5 RESISTANT Resistant     RIFAMPIN <=0.5 SENSITIVE Sensitive     TRIMETH/SULFA <=10 SENSITIVE Sensitive     VANCOMYCIN 1 SENSITIVE Sensitive     TETRACYCLINE <=1 SENSITIVE Sensitive     MOXIFLOXACIN <=0.25 SENSITIVE Sensitive     * MODERATE STAPHYLOCOCCUS AUREUS  Culture, blood (routine x 2)     Status: None (Preliminary result)   Collection Time: 01/17/15 10:12 PM  Result Value Ref Range Status   Specimen Description BLOOD RIGHT HAND  Final   Special Requests BOTTLES DRAWN AEROBIC ONLY 4.5CC PT ON ROCEPHIN  Final   Culture   Final           BLOOD CULTURE RECEIVED NO GROWTH TO DATE CULTURE WILL BE HELD FOR 5 DAYS BEFORE ISSUING A FINAL NEGATIVE REPORT Performed at [facility]    Report Status PENDING  Incomplete  Culture, blood (routine x 2)     Status: None (Preliminary result)   Collection Time: 01/17/15 10:15 PM  Result Value Ref Range Status   Specimen Description BLOOD RIGHT ARM  Final   Special Requests   Final    BOTTLES DRAWN AEROBIC AND ANAEROBIC 10CC EACH ROCEPHIN   Culture   Final           BLOOD CULTURE RECEIVED NO GROWTH TO DATE CULTURE WILL BE HELD FOR 5 DAYS BEFORE ISSUING A FINAL NEGATIVE REPORT Note: Culture results may be compromised due to an excessive volume of blood received in culture bottles. Performed at [facility]    Report Status PENDING  Incomplete    Medical History: Past Medical History  Diagnosis Date  . Hypertension    Assessment: 49 yo male presented on 01/08/15 with out of hospital arrest, shocked and brought  to [facility] for urgent cath. CODE STEMI Called and he was placed on hypothermia protocol. Started on antibiotics for possible pneumonia but narrowed to cefazolin on 6/17 for MSSA PNA. Pt is afebrile and WBC is mildly elevated at 11.3. Dose remains appropriate. Renal function has normalized with Scr down to 1.07.   6/8 Vanc>>6/10, 6/15>> 6/17 6/10 Linezolid>>6/12  6/8 Ceftaz>>6/12, 6/15>> 6/17 6/11 CTX >>6/15  6/10 Cipro>>6/11  6/17 Cefazolin>>  6/8 resp cx>> abundant staph aureus- MSSA  6/10 resp cx - MSSA  6/10 blood x2>>ngtd  6/14 bld x2 >>ngtd  Plan:  - Continue  cefazolin 2gm IV Q8H - F/u renal fxn, C&S, clinical status - F/u planned LOT  Lysle Pearl, PharmD, BCPS Pager # [phone] 01/23/2015 9:09 AM

## 2015-01-23 NOTE — Progress Notes (Signed)
   VASCULAR SURGERY ASSESSMENT & PLAN:  * Toes on left foot are starting to demarcate. He has palpable pedal pulses and tips of toes may "autoamputate."  * Will continue to follow peripherally   SUBJECTIVE: No significant pain  PHYSICAL EXAM: Filed Vitals:   01/22/15 1621 01/22/15 2016 01/23/15 0002 01/23/15 0352  BP: 126/81 118/50 96/31 142/79  Pulse:  84 78 74  Temp: 98.3 F (36.8 C) 98.4 F (36.9 C) 98 F (36.7 C) 98.8 F (37.1 C)  TempSrc: Oral Oral Oral Oral  Resp: 18 22 20 19   Height:      Weight:      SpO2: 100% 100% 100% 100%   Toes on left foot are demarcating.   LABS: Lab Results  Component Value Date   WBC 11.3* 01/23/2015   HGB 10.0* 01/23/2015   HCT 33.0* 01/23/2015   MCV 91.7 01/23/2015   PLT 749* 01/23/2015   Lab Results  Component Value Date   CREATININE 1.07 01/23/2015   Lab Results  Component Value Date   INR 1.70* 01/08/2015   CBG (last 3)   Recent Labs  01/22/15 2013 01/23/15 0014 01/23/15 0351  GLUCAP 132* 113* 102*    Active Problems:   Cardiac arrest   Acute ST elevation myocardial infarction (STEMI) involving right coronary artery   Acute respiratory failure with hypoxia   Hypokalemia   Acute pulmonary edema   Hyperglycemia   Encephalopathy acute   Pneumonia, organism unspecified   Encounter for intubation   Septic shock   ARDS (adult respiratory distress syndrome)   Thrombocytopenia   Staphylococcal pneumonia   AKI (acute kidney injury)   Anoxic brain injury   Absolute anemia   Cardiogenic shock   Essential hypertension   Gangrene of foot   Acute on chronic renal failure   Hypernatremia   Shock liver   Anoxic encephalopathy   Dysphagia    Cari Caraway Beeper: [phone] 01/23/2015

## 2015-01-23 NOTE — Progress Notes (Signed)
I have received word via Deatra Ina,  rehab PA that Dr. Sharon Seller does not feel pt. Is medically ready for rehab today.  Will follow up tomorrow with admission depending on medical readiness and bed availability.  Please call if questions.  Weldon Picking PT Inpatient Rehab Admissions Coordinator Cell [phone] Office [phone]

## 2015-01-23 NOTE — Progress Notes (Addendum)
Physical Therapy Treatment Patient Details Name: Alexander Berry MRN: [id number] DOB: 1965-09-13 Today's Date: 01/23/2015    History of Present Illness VF arrest while playing soccer 6/05. Prolonged ACLS. Hypothermia protocol ETT 6/5-6/9 self-extubated, reintubated 6/9-6/16    PT Comments    Pt with excellent progress today. Pt with improved strength all extremities, able to grip with both hands today, feeding himself and walking. Pt continues to need assist with transfers and gait but great change from evaluation. Pt A&Ox 4. Pt and family educated for activity progression, HEP and goals. Pt with decreased edema bil LE and UE today with no report of pain in feet. Encouraged OOB throughout day and continued HEP. Continue to recommend CIR for maximized independence prior to D/C. Wife and dgtr present throughout to assist with interpretation.   Follow Up Recommendations  CIR     Equipment Recommendations  Rolling walker with 5" wheels    Recommendations for Other Services       Precautions / Restrictions Precautions Precaution Comments: gangrenous toes    Mobility  Bed Mobility               General bed mobility comments: in chair beginning and end of session  Transfers Overall transfer level: Needs assistance     Sit to Stand: Min assist         General transfer comment: over 4 trials pt progressed from min assist to minguard with cues for hand placement, scooting and safety. Posterior lean with first 3 trials on initial standing  Ambulation/Gait Ambulation/Gait assistance: Min assist;+2 safety/equipment Ambulation Distance (Feet): 54 Feet Assistive device: Rolling walker (2 wheeled) Gait Pattern/deviations: Step-through pattern;Decreased stride length;Narrow base of support   Gait velocity interpretation: Below normal speed for age/gender General Gait Details: Pt performed 4 gait trials of 24', 36', 50', 54' respectively with seated rest between each trial. Pt with  increased sway and utlization of right quadratus lumborum for gait with scissoring with gait. Cues for posture, position in RW and looking up   Stairs            Wheelchair Mobility    Modified Rankin (Stroke Patients Only)       Balance Overall balance assessment: Needs assistance   Sitting balance-Leahy Scale: Good       Standing balance-Leahy Scale: Fair                      Cognition Arousal/Alertness: Awake/alert Behavior During Therapy: WFL for tasks assessed/performed Overall Cognitive Status: Within Functional Limits for tasks assessed                      Exercises General Exercises - Lower Extremity Long Arc Quad: AROM;Seated;Both;15 reps Hip Flexion/Marching: AROM;Seated;Both;15 reps    General Comments        Pertinent Vitals/Pain Pain Assessment: No/denies pain  HR 92-107 with activity     Home Living                      Prior Function            PT Goals (current goals can now be found in the care plan section) Progress towards PT goals: Goals met and updated - see care plan    Frequency  Min 3X/week    PT Plan Frequency needs to be updated;Current plan remains appropriate    Co-evaluation             End of Session  Equipment Utilized During Treatment: Gait belt Activity Tolerance: Patient tolerated treatment well Patient left: in chair;with call bell/phone within reach;with chair alarm set;with family/visitor present     Time: 4068-4033 PT Time Calculation (min) (ACUTE ONLY): 26 min  Charges:  $Gait Training: 8-22 mins $Therapeutic Exercise: 8-22 mins                    G Codes:      Melford Aase Feb 13, 2015, 10:09 AM Elwyn Reach, [place]

## 2015-01-24 ENCOUNTER — Inpatient Hospital Stay (HOSPITAL_COMMUNITY): Payer: Medicaid Other

## 2015-01-24 ENCOUNTER — Inpatient Hospital Stay (HOSPITAL_COMMUNITY)
Admission: RE | Admit: 2015-01-24 | Payer: MEDICAID | Source: Intra-hospital | Admitting: Physical Medicine & Rehabilitation

## 2015-01-24 ENCOUNTER — Inpatient Hospital Stay (HOSPITAL_COMMUNITY)
Admission: RE | Admit: 2015-01-24 | Discharge: 2015-02-02 | DRG: 091 | Disposition: A | Discharge status: Home / Self Care | Payer: Self-pay | Source: Intra-hospital | Attending: Physical Medicine & Rehabilitation | Admitting: Physical Medicine & Rehabilitation

## 2015-01-24 ENCOUNTER — Encounter (HOSPITAL_COMMUNITY): Payer: Self-pay | Admitting: General Practice

## 2015-01-24 DIAGNOSIS — R5381 Other malaise: Secondary | ICD-10-CM | POA: Diagnosis present

## 2015-01-24 DIAGNOSIS — Z8674 Personal history of sudden cardiac arrest: Secondary | ICD-10-CM

## 2015-01-24 DIAGNOSIS — I96 Gangrene, not elsewhere classified: Secondary | ICD-10-CM

## 2015-01-24 DIAGNOSIS — A419 Sepsis, unspecified organism: Secondary | ICD-10-CM

## 2015-01-24 DIAGNOSIS — I10 Essential (primary) hypertension: Secondary | ICD-10-CM | POA: Diagnosis present

## 2015-01-24 DIAGNOSIS — Z955 Presence of coronary angioplasty implant and graft: Secondary | ICD-10-CM

## 2015-01-24 DIAGNOSIS — R6521 Severe sepsis with septic shock: Secondary | ICD-10-CM

## 2015-01-24 DIAGNOSIS — G931 Anoxic brain damage, not elsewhere classified: Principal | ICD-10-CM | POA: Diagnosis present

## 2015-01-24 DIAGNOSIS — J15211 Pneumonia due to Methicillin susceptible Staphylococcus aureus: Secondary | ICD-10-CM | POA: Diagnosis present

## 2015-01-24 DIAGNOSIS — K5900 Constipation, unspecified: Secondary | ICD-10-CM | POA: Diagnosis present

## 2015-01-24 DIAGNOSIS — D649 Anemia, unspecified: Secondary | ICD-10-CM | POA: Diagnosis present

## 2015-01-24 LAB — CBC
HCT: 27.8 % — ABNORMAL LOW (ref 39.0–52.0)
Hemoglobin: 8.9 g/dL — ABNORMAL LOW (ref 13.0–17.0)
MCH: 28.2 pg (ref 26.0–34.0)
MCHC: 32 g/dL (ref 30.0–36.0)
MCV: 88 fL (ref 78.0–100.0)
Platelets: 741 10*3/uL — ABNORMAL HIGH (ref 150–400)
RBC: 3.16 MIL/uL — ABNORMAL LOW (ref 4.22–5.81)
RDW: 15.5 % (ref 11.5–15.5)
WBC: 7.6 10*3/uL (ref 4.0–10.5)

## 2015-01-24 LAB — COMPREHENSIVE METABOLIC PANEL
ALT: 62 U/L (ref 17–63)
AST: 43 U/L — ABNORMAL HIGH (ref 15–41)
Albumin: 2.4 g/dL — ABNORMAL LOW (ref 3.5–5.0)
Alkaline Phosphatase: 86 U/L (ref 38–126)
Anion gap: 6 (ref 5–15)
BUN: 32 mg/dL — ABNORMAL HIGH (ref 6–20)
CALCIUM: 8.1 mg/dL — AB (ref 8.9–10.3)
CO2: 21 mmol/L — ABNORMAL LOW (ref 22–32)
Chloride: 112 mmol/L — ABNORMAL HIGH (ref 101–111)
Creatinine, Ser: 0.96 mg/dL (ref 0.61–1.24)
GFR calc non Af Amer: >60 mL/min (ref >60)
GLUCOSE: 106 mg/dL — AB (ref 65–99)
Potassium: 3.9 mmol/L (ref 3.5–5.1)
Sodium: 139 mmol/L (ref 135–145)
Total Bilirubin: 1.7 mg/dL — ABNORMAL HIGH (ref 0.3–1.2)
Total Protein: 5.6 g/dL — ABNORMAL LOW (ref 6.5–8.1)

## 2015-01-24 LAB — CULTURE, BLOOD (ROUTINE X 2)
CULTURE: NO GROWTH
Culture: NO GROWTH

## 2015-01-24 LAB — CREATININE, SERUM
CREATININE: 1.05 mg/dL (ref 0.61–1.24)
GFR calc Af Amer: >60 mL/min (ref >60)

## 2015-01-24 MED ORDER — LISINOPRIL 10 MG PO TABS
10.0000 mg | ORAL_TABLET | Freq: Every day | ORAL | Status: DC
  Administered 2015-01-25 – 2015-02-02 (×9): 10 mg via ORAL
  Filled 2015-01-24 (×11): qty 1

## 2015-01-24 MED ORDER — CETYLPYRIDINIUM CHLORIDE 0.05 % MT LIQD
7.0000 mL | Freq: Two times a day (BID) | OROMUCOSAL | Status: DC
  Administered 2015-01-24 – 2015-01-31 (×14): 7 mL via OROMUCOSAL

## 2015-01-24 MED ORDER — ONDANSETRON HCL 4 MG/2ML IJ SOLN: 4.0000 mg | Freq: Four times a day (QID) | INTRAMUSCULAR | Status: DC | PRN

## 2015-01-24 MED ORDER — CARVEDILOL 6.25 MG PO TABS: 18.7500 mg | ORAL_TABLET | Freq: Two times a day (BID) | ORAL | Status: DC

## 2015-01-24 MED ORDER — TICAGRELOR 90 MG PO TABS: 90.0000 mg | ORAL_TABLET | Freq: Two times a day (BID) | ORAL | Status: DC

## 2015-01-24 MED ORDER — AMLODIPINE BESYLATE 5 MG PO TABS
5.0000 mg | ORAL_TABLET | Freq: Every day | ORAL | Status: DC
  Administered 2015-01-25 – 2015-01-31 (×5): 5 mg via ORAL
  Filled 2015-01-24 (×11): qty 1

## 2015-01-24 MED ORDER — SORBITOL 70 % SOLN: 30.0000 mL | Freq: Every day | Status: DC | PRN

## 2015-01-24 MED ORDER — ADULT MULTIVITAMIN W/MINERALS CH
1.0000 | ORAL_TABLET | Freq: Every day | ORAL | Status: DC
  Administered 2015-01-25 – 2015-02-02 (×9): 1 via ORAL
  Filled 2015-01-24 (×11): qty 1

## 2015-01-24 MED ORDER — ENOXAPARIN SODIUM 40 MG/0.4ML ~~LOC~~ SOLN: 40.0000 mg | SUBCUTANEOUS | Status: DC

## 2015-01-24 MED ORDER — LISINOPRIL 10 MG PO TABS
10.0000 mg | ORAL_TABLET | Freq: Every day | ORAL | Status: DC
Start: 2015-01-24 — End: 2015-02-02

## 2015-01-24 MED ORDER — GLUCERNA SHAKE PO LIQD: 237.0000 mL | Freq: Two times a day (BID) | ORAL | Status: DC

## 2015-01-24 MED ORDER — FAMOTIDINE 40 MG/5ML PO SUSR: 20.0000 mg | Freq: Every day | ORAL | Status: DC

## 2015-01-24 MED ORDER — GLUCERNA SHAKE PO LIQD
237.0000 mL | Freq: Two times a day (BID) | ORAL | Status: DC
  Administered 2015-01-25 – 2015-01-31 (×9): 237 mL via ORAL

## 2015-01-24 MED ORDER — ASPIRIN 81 MG PO CHEW: 81.0000 mg | CHEWABLE_TABLET | Freq: Every day | ORAL | Status: AC

## 2015-01-24 MED ORDER — TICAGRELOR 90 MG PO TABS
90.0000 mg | ORAL_TABLET | Freq: Two times a day (BID) | ORAL | Status: DC
  Administered 2015-01-24 – 2015-02-02 (×18): 90 mg via ORAL
  Filled 2015-01-24 (×21): qty 1

## 2015-01-24 MED ORDER — ATORVASTATIN CALCIUM 80 MG PO TABS
80.0000 mg | ORAL_TABLET | Freq: Every day | ORAL | Status: DC
  Administered 2015-01-24: 80 mg via ORAL
  Filled 2015-01-24: qty 1

## 2015-01-24 MED ORDER — DOCUSATE SODIUM 50 MG/5ML PO LIQD
100.0000 mg | Freq: Two times a day (BID) | ORAL | Status: DC | PRN
  Filled 2015-01-24: qty 10

## 2015-01-24 MED ORDER — CARVEDILOL 6.25 MG PO TABS
18.7500 mg | ORAL_TABLET | Freq: Two times a day (BID) | ORAL | Status: DC
  Administered 2015-01-25 – 2015-02-02 (×16): 18.75 mg via ORAL
  Filled 2015-01-24 (×20): qty 1

## 2015-01-24 MED ORDER — ENOXAPARIN SODIUM 40 MG/0.4ML ~~LOC~~ SOLN
40.0000 mg | SUBCUTANEOUS | Status: DC
  Administered 2015-01-25 – 2015-02-01 (×8): 40 mg via SUBCUTANEOUS
  Filled 2015-01-24 (×9): qty 0.4

## 2015-01-24 MED ORDER — ACETAMINOPHEN 325 MG PO TABS: 650.0000 mg | ORAL_TABLET | ORAL | Status: AC | PRN

## 2015-01-24 MED ORDER — ASPIRIN 81 MG PO CHEW
81.0000 mg | CHEWABLE_TABLET | Freq: Every day | ORAL | Status: DC
  Administered 2015-01-25 – 2015-02-02 (×9): 81 mg via ORAL
  Filled 2015-01-24 (×9): qty 1

## 2015-01-24 MED ORDER — ACETAMINOPHEN 325 MG PO TABS
650.0000 mg | ORAL_TABLET | ORAL | Status: DC | PRN
  Administered 2015-01-25 – 2015-02-01 (×8): 650 mg via ORAL
  Filled 2015-01-24 (×8): qty 2

## 2015-01-24 MED ORDER — ONDANSETRON HCL 4 MG PO TABS: 4.0000 mg | ORAL_TABLET | Freq: Four times a day (QID) | ORAL | Status: DC | PRN

## 2015-01-24 MED ORDER — FAMOTIDINE 40 MG/5ML PO SUSR
20.0000 mg | Freq: Every day | ORAL | Status: DC
  Administered 2015-01-25: 20 mg via ORAL
  Filled 2015-01-24 (×4): qty 2.5

## 2015-01-24 MED ORDER — AMLODIPINE BESYLATE 5 MG PO TABS: 5.0000 mg | ORAL_TABLET | Freq: Every day | ORAL | Status: DC

## 2015-01-24 MED ORDER — SPIRONOLACTONE 25 MG PO TABS: 12.5000 mg | ORAL_TABLET | Freq: Every day | ORAL | Status: DC

## 2015-01-24 NOTE — Progress Notes (Signed)
Inpatient Rehabilitation  I have discussed pt's case with Dr. Joseph Art and have medical clearance to admit pt. To IP rehab today.  Pt. And family in agreement and he is pleased to get started with his IP Rehab .  I have updated pt's RN Viviana Simpler, CM and Poonum Ambelal, SW of the plan.  I will make all arrangements for admission today.  Please call if questions.  Weldon Picking PT Inpatient Rehab Admissions Coordinator Cell [phone] Office [phone]

## 2015-01-24 NOTE — Interval H&P Note (Signed)
Alexander Berry was admitted today to Inpatient Rehabilitation with the diagnosis of anoxic brain injury.  The patient's history has been reviewed, patient examined, and there is no change in status.  Patient continues to be appropriate for intensive inpatient rehabilitation.  I have reviewed the patient's chart and labs.  Questions were answered to the patient's satisfaction. The PAPE has been reviewed and assessment remains appropriate.  SWARTZ,ZACHARY T 01/24/2015, 8:27 PM

## 2015-01-24 NOTE — Discharge Summary (Signed)
Physician Discharge Summary  Rylei Codispoti EAV:[id number] DOB: 09-Aug-1965 DOA: 01/08/2015  PCP: No PCP Per Patient  Admit date: 01/08/2015 Discharge date: 01/24/2015  Time spent: 40 minutes  Recommendations for Outpatient Follow-up:  Acute STEMI 6/05/ VF arrest 6/05 -s/p PCI/DES to mid RCA occlusion  -In and out since admission -5.2 L -Follow-up with Dr. Verdis Prime 4 weeks  Cardiogenic shock -resolved -Discharge to CIR for cardiac rehabilitation   Hypertension,  -Amlodipine 5 mg daily  -Coreg 18.75 mg  BID -Lisinopril 10 mg daily -Aldactone 12.5 mg daily -Brilinta 90 mg BID -Follow-up [facility] 2 weeks  Septic shock  -Multifactorial l to include MSSA pneumonia, ARDS, and gangrenous toe - resolved 6/11 -Vascular consulted for gangrene toes - outpt FU  Gangrenous left toe -Patient third-fifth left metatarsal gangrenous; evaluated by vascular surgery who would like for patient to follow-up as an outpatient -Follow-up with Dr. Waverly Ferrari in 4 weeks   Acute on chronic renal failure?(Cr 2.15 on admission) -Resolved, monitor closely  Hypokalemia,  -Potassium goal> 4 -Currently slightly under 4,   Hypernatremia -Resolved patient ready for discharge.  Shock liver -Biliary sludge by RUQ [place] 6/10 -Transaminases slightly above normal but significantly improved from admission. -PCP to follow  Hyperbilirubinemia -Remains slightly elevated PCP to follow  Dysphagia    Anemia without overt blood loss -Stable   Anoxic encephalopathy - Improved  -MRI WNL    Discharge Diagnoses:  Active Problems:   Cardiac arrest   Acute ST elevation myocardial infarction (STEMI) involving right coronary artery   Acute respiratory failure with hypoxia   Hypokalemia   Acute pulmonary edema   Hyperglycemia   Encephalopathy acute   Pneumonia, organism unspecified   Encounter for intubation   Septic shock   ARDS (adult respiratory distress  syndrome)   Thrombocytopenia   Staphylococcal pneumonia   AKI (acute kidney injury)   Anoxic brain injury   Absolute anemia   Cardiogenic shock   Essential hypertension   Gangrene of foot   Acute on chronic renal failure   Hypernatremia   Shock liver   Anoxic encephalopathy   Dysphagia   Gangrene of toe   Discharge Condition: Stable  Diet recommendation: Heart healthy  Filed Weights   01/12/15 0400 01/14/15 0315 01/24/15 0505  Weight: 94.5 kg (208 lb 5.4 oz) 93.21 kg (205 lb 7.9 oz) 85.594 kg (188 lb 11.2 oz)    History of present illness:  49 y/o HM PMHx no significant history, no prior cardiac history.  Presented to [facility] urgently by EMS as a wittenssed out-of hospital cardiac arrest. He was playing soccer when he had sudden syncope and collapse. Bystander CPR was preformed. On EMS arrival, he was in Vfib. CPR was continued. He was given Epi x 9 and Amiodarone x 2. He was intubated in the field. EKG now shows global ST elevations. He has been taken urgently to the cath lab.   His family reports he has a h/o HTN but he is not on any medications. No history of tobacco use. No known history of DM. No family h/o CAD or SCD. His father had HTN. His wife reports that he did complain of chest discomfort early this am. No reports of any other symptoms. She was diagnosed with V. fib arrest secondary to NSTEMI. A left heart cath was performed a drug alluding stent placed. Patient was also diagnosed with metatarsals 3-5 dry gangrene. Patient was evaluated by vascular surgery who felt that at this time no  surgery was warranted but request that patient follow-up as an outpatient with them. In addition patient was treated for MSSA pneumonia and completed an appropriate course of antibiotics.    Consultants: Dr.Daniel R Bensimhon (heart failure team) Dr.Christopher Adele Dan (vascular surgery) Dr.Rakesh Vassie Loll [facility] M)   Procedure/Significant Events: 6/05 Admitted after OOH arrest 6/05 LHC:  Dist RCA lesion, 80% stenosed. Mid RCA lesion, 100% stenosed. There is a 0% residual stenosis post intervention. A drug-eluting stent was placed. 2nd RPLB-1 lesion, 100% stenosed. 2nd RPLB-2 lesion, 100% stenosed. There is a 0% residual stenosis post intervention. RPDA lesion, 80% stenosed. There is a 0% residual stenosis post intervention.  6/06 EEG: Abnormal EEG due to the presence of a generalized slowing indicating a mild to moderate cerebral disturbance (encephalopathy). No epileptiform activity noted 6/06 TTE: The estimated ejection fraction was in the range of 55% to 60%. Mild posterior wall hypokinesis - decreased Definity microsphere uptake in the posterior wall suggests hypoperfusion  6/07 Rewarmed. Continuous sedatives stopped. RASS -5. No spont movement 6/07 CT head: NAD 6/07 repeat EEG: Abnormal EEG due to the presence of a generalized slowing indicating a mild to moderate cerebral disturbance (encephalopathy). No epileptiform activity noted 6/08 Increased agitation requiring sedation. Not F/C. Increased O2 reqts. Frothy pink sputum. Resp culture obatined. PCT elevated. Empiric abx initiated. CXR c/w increased pulm edema. Hypertensive. Furosemide ordered 6/08 repeat limited TTE: LVEF 50%. Inferior wall AK 6/09 Intermittently F/C. RASS -2 to +1. Requiring 70% O2. Dexmedetomidine initiated 6/09 PM: self extubated, required immediate re-intubation. High fevers - cooling blanket initiated. Hypotension - NE initiated 6/10 High fevers persist. Arctic sun device re-ordered to maintain T < 99.5. PCT remained elevated. Abx adjusted. Cr rising. New dx of severe sepsis/septic shock rendered. Elevated bilirubin  6/10 RUQ [place]: biliary sludge. No obstruction 6/11 Off vasopressors. Fevers controlled. Cr improving. No WUA due to increasing agitation 6/12 ARDS persists. AKI improving. Worsening hypernatremia - D5W initiated 6/21 ABI bilateral; normal study   Culture 6/8 Resp positive MSSA 6/10  Resp positive MSSA  6/10 Blood right hand/left antecubital negative 6/14 blood right hand/arm NGTD   Antibiotics: Vanc 6/08 >> stopped at one point then restarted 6/15>>> Linezolid 6/10 >> 6/11  Ciprofloxacin 6/10 >> 6/12 Ceftazidime 6/08 >> 6/12>>>6/15>>> Ceftriaxone 6/12 >> 6/15     Discharge Exam: Filed Vitals:   01/23/15 1610 01/23/15 1708 01/23/15 2038 01/24/15 0505  BP: 121/59 100/62 114/60 114/57  Pulse: 86  71 71  Temp: 98.5 F (36.9 C) 98.5 F (36.9 C) 98.6 F (37 C) 98.5 F (36.9 C)  TempSrc: Oral Oral Oral Oral  Resp: 21 18 18 18   Height:      Weight:    85.594 kg (188 lb 11.2 oz)  SpO2: 99% 97% 100% 97%    General: A/O 4, NAD, No acute respiratory distress Eyes: Negative headache, negative retinal hemorrhage ENT: Negative Runny nose, negative ear pain, negative tinnitus, negative gingival bleeding,  Neck: Negative scars, masses, torticollis, lymphadenopathy, JVD Lungs: Clear to auscultation bilaterally without wheezes or crackles Cardiovascular: Regular rate and rhythm without murmur gallop or rub normal S1 and S2 Abdomen:negative abdominal pain, negative dysphagia, Nontender, nondistended, soft, bowel sounds positive, no rebound, no ascites, no appreciable mass  Discharge Instructions     Medication List    TAKE these medications        acetaminophen 325 MG tablet  Commonly known as:  TYLENOL  Take 2 tablets (650 mg total) by mouth every 4 (four) hours as needed  for mild pain, fever or headache.     amLODipine 5 MG tablet  Commonly known as:  NORVASC  Take 1 tablet (5 mg total) by mouth daily.     aspirin 81 MG chewable tablet  Chew 1 tablet (81 mg total) by mouth daily.     carvedilol 6.25 MG tablet  Commonly known as:  COREG  Take 3 tablets (18.75 mg total) by mouth 2 (two) times daily with a meal.     famotidine 40 MG/5ML suspension  Commonly known as:  PEPCID  Take 2.5 mLs (20 mg total) by mouth daily.     feeding supplement  (GLUCERNA SHAKE) Liqd  Take 237 mLs by mouth 2 (two) times daily between meals.     lisinopril 10 MG tablet  Commonly known as:  PRINIVIL,ZESTRIL  Take 1 tablet (10 mg total) by mouth daily.     spironolactone 25 MG tablet  Commonly known as:  ALDACTONE  Take 0.5 tablets (12.5 mg total) by mouth daily.     ticagrelor 90 MG Tabs tablet  Commonly known as:  BRILINTA  Take 1 tablet (90 mg total) by mouth 2 (two) times daily.       No Known Allergies Follow-up Information    Follow up with Lesleigh Noe, MD. Schedule an appointment as soon as possible for a visit in 4 weeks.   Specialty:  Cardiology   Why:  Follow-up for VF arrest, acute STEMI    Contact information:   [address] [place] [address] [phone]       Follow up with [facility]    . Schedule an appointment as soon as possible for a visit in 2 weeks.   Contact information:   [address] [place] [address] [phone]      Follow up with Waverly Ferrari, MD. Schedule an appointment as soon as possible for a visit in 2 weeks.   Specialties:  Vascular Surgery, Cardiology   Why:  Follow-up with Dr. Waverly Ferrari for gangrenous left toes in 2 weeks   Contact information:   [address] [place] [address] [phone]        The results of significant diagnostics from this hospitalization (including imaging, microbiology, ancillary and laboratory) are listed below for reference.    Significant Diagnostic Studies: Ct Head Wo Contrast  01/10/2015   CLINICAL DATA:  Collapsed during soccer game on Sunday.  EXAM: CT HEAD WITHOUT CONTRAST  TECHNIQUE: Contiguous axial images were obtained from the base of the skull through the vertex without intravenous contrast.  COMPARISON:  None.  FINDINGS: No acute intracranial abnormality. Specifically, no hemorrhage, hydrocephalus, mass lesion, acute infarction, or significant  intracranial injury. No acute calvarial abnormality.  IMPRESSION: No acute intracranial abnormality.   Electronically Signed   By: Charlett Nose M.D.   On: 01/10/2015 18:40   Mr Brain Wo Contrast  01/19/2015   CLINICAL DATA:  Cardiac arrest on soccer field, ventricular fibrillation. Evaluate anoxic brain injury. History of hyperglycemia, myocardial infarction, acute kidney injury.  EXAM: MRI HEAD WITHOUT CONTRAST  TECHNIQUE: Multiplanar, multiecho pulse sequences of the brain and surrounding structures were obtained without intravenous contrast.  COMPARISON:  CT head January 10, 2015  FINDINGS: The ventricles and sulci are normal for patient's age. No abnormal parenchymal signal, mass lesions, mass effect. No reduced diffusion to suggest acute ischemia. No susceptibility artifact to suggest hemorrhage.  No abnormal extra-axial fluid collections. No extra-axial  masses though, contrast enhanced sequences would be more sensitive. Normal major intracranial vascular flow voids seen at the skull base.  Ocular globes and orbital contents are unremarkable though not tailored for evaluation. No abnormal sellar expansion. No suspicious calvarial bone marrow signal. No abnormal sellar expansion. Craniocervical junction maintained. Small bilateral mastoid effusions. Paranasal sinus mucosal thickening with sphenoid sinus air-fluid levels likely secondary to life-support lines  IMPRESSION: Normal noncontrast MRI of the brain; no MR findings of anoxic brain injury/ hypoxic ischemic injury.   Electronically Signed   By: Awilda Metro M.D.   On: 01/19/2015 05:42   Dg Chest Port 1 View  01/20/2015   CLINICAL DATA:  Hypoxia.  Extubation.  EXAM: PORTABLE CHEST - 1 VIEW  COMPARISON:  01/19/2015.  FINDINGS: Interim extubation removal of NG tube. Right IJ line in stable position. Heart size stable. Persistent diffuse bilateral pulmonary infiltrates. No interim improvement. No pleural effusion or pneumothorax.  IMPRESSION: 1. Interim  removal of endotracheal tube and NG tube. Right IJ line stable position.  2.  Persistent unchanged diffuse bilateral pulmonary infiltrates.   Electronically Signed   By: Maisie Fus  Register   On: 01/20/2015 07:31   Dg Chest Port 1 View  01/19/2015   CLINICAL DATA:  Hypoxia.  Status post cardiac arrest  EXAM: PORTABLE CHEST - 1 VIEW  COMPARISON:  January 18, 2015  FINDINGS: Endotracheal tube tip is 2.8 cm above the carina. Nasogastric tube tip and side port are in the stomach. Central catheter tip is in the superior vena cava. No pneumothorax. Moderate interstitial edema remains bilaterally, stable. There is no new opacity. Heart is upper normal in size with pulmonary vascular within normal limits, stable. No adenopathy.  IMPRESSION: Tube and catheter positions as described without pneumothorax. Moderate generalized interstitial edema, stable. No new opacity. Suspect pulmonary edema, possibly noncardiogenic given the clinical history. There may be a degree of underlying ARDS or possible aspiration pneumonitis.   Electronically Signed   By: Bretta Bang III M.D.   On: 01/19/2015 08:20   Dg Chest Port 1 View  01/18/2015   CLINICAL DATA:  Hypoxia  EXAM: PORTABLE CHEST - 1 VIEW  COMPARISON:  January 17, 2015  FINDINGS: Endotracheal tube tip is 2.0 cm above the carina. Nasogastric tube tip and side port are below the diaphragm. Central catheter tip is in the superior vena cava. No pneumothorax. There is moderate interstitial edema bilaterally, stable. There is no appreciable airspace consolidation. Heart is upper normal in size with pulmonary vascular within normal limits. No adenopathy.  IMPRESSION: Tube and catheter positions as described without pneumothorax. Moderate interstitial edema, stable. No new opacity. No change in cardiac silhouette.   Electronically Signed   By: Bretta Bang III M.D.   On: 01/18/2015 07:15   Dg Chest Port 1 View  01/17/2015   CLINICAL DATA:  49 year old male status post cardiac  arrest, respiratory failure with hypoxia. Intubated. Initial encounter.  EXAM: PORTABLE CHEST - 1 VIEW  COMPARISON:  01/16/2015 and earlier.  FINDINGS: Portable AP semi upright view at 0513 hours. Stable endotracheal tube tip just below the clavicles. Stable right IJ central line. Enteric tube in place, courses to the left abdomen with tip not included.  Stable lung volumes. Stable cardiac size and mediastinal contours. Widespread coarse pulmonary opacity with mildly improved bilateral ventilation over this series of exams since 01/12/2015. No pneumothorax or pleural effusion.  IMPRESSION: 1.  Stable lines and tubes. 2. Widespread coarse pulmonary opacity with slowly improving bilateral ventilation  since 01/12/2015. No new cardiopulmonary abnormality.   Electronically Signed   By: Odessa Fleming M.D.   On: 01/17/2015 07:32   Dg Chest Port 1 View  01/16/2015   CLINICAL DATA:  Respiratory distress syndrome  EXAM: PORTABLE CHEST - 1 VIEW  COMPARISON:  01/15/2015  FINDINGS: Endotracheal tube with tip measuring 3 cm above the carina. Enteric tube tip is off the field of view but below the left hemidiaphragm. Right central venous catheter with tip over the the upper SVC region laterally. No change in position of appliances. Diffuse bilateral airspace disease in the lungs suggesting ARDS or edema. Heart size is normal. No blunting of costophrenic angles. No pneumothorax. Calcification of the aorta.  IMPRESSION: Appliances appear in satisfactory position without significant change. Diffuse bilateral pulmonary infiltrates.   Electronically Signed   By: Burman Nieves M.D.   On: 01/16/2015 02:54   Dg Chest Port 1 View  01/15/2015   CLINICAL DATA:  Respiratory failure.  EXAM: PORTABLE CHEST - 1 VIEW  COMPARISON:  1 day prior  FINDINGS: Endotracheal tube terminates 2.7 cm above carina. Right internal jugular line unchanged at mid SVC. Nasogastric tube extends beyond the inferior aspect of the film. Cardiomegaly accentuated by  AP portable technique. No pleural effusion or pneumothorax. Patchy airspace opacities persist bilaterally. Not significantly changed.  IMPRESSION: No change in patchy bilateral airspace disease, suspicious for infection versus developing ARDS.   Electronically Signed   By: Jeronimo Greaves M.D.   On: 01/15/2015 09:00   Dg Chest Port 1 View  01/14/2015   CLINICAL DATA:  Respiratory failure  EXAM: PORTABLE CHEST - 1 VIEW  COMPARISON:  01/13/2015  FINDINGS: The endotracheal tube is 2.3 cm above the carina. The right jugular central line extends into the SVC. Multifocal airspace consolidation persists although there does appear to be partial clearing on the right. No pneumothorax.  IMPRESSION: Support equipment appears satisfactorily positioned.  Partial clearance of right-sided airspace consolidation. No significant change on the left.   Electronically Signed   By: Ellery Plunk M.D.   On: 01/14/2015 05:19   Dg Chest Port 1 View  01/13/2015   CLINICAL DATA:  Respiratory failure  EXAM: PORTABLE CHEST - 1 VIEW  COMPARISON:  01/12/2015  FINDINGS: The endotracheal tube is 4.2 cm above the carina. The nasogastric tube extends into the stomach. The right jugular central line extends into the SVC. There is no pneumothorax. There are multifocal airspace opacities with confluent consolidation in the right upper lobe and left lower lobe. No large effusion is evident.  IMPRESSION: Support equipment appears satisfactorily positioned.  No significant interval change in the bilateral airspace opacities.   Electronically Signed   By: Ellery Plunk M.D.   On: 01/13/2015 05:35   Dg Chest Port 1 View  01/12/2015   CLINICAL DATA:  Respiratory failure and accidental self extubation with re-intubation performed.  EXAM: PORTABLE CHEST - 1 VIEW  COMPARISON:  0430 hours  FINDINGS: The new endotracheal tube is appropriately positioned with the tip approximately 2 cm above the carina. Right jugular central line tip is stable in  position within the upper SVC. Lungs show persistent dense airspace edema. Compared to the film earlier this morning, there is very slight improved aeration in the right central and lower lung. No pneumothorax or significant pleural fluid present. The heart size and mediastinal contours are stable.  IMPRESSION: New endotracheal tube tip lies 2 cm above the carina. Since the prior study, there is some improved aeration  in the right central and lower lung.   Electronically Signed   By: Irish Lack M.D.   On: 01/12/2015 18:53   Dg Chest Port 1 View  01/12/2015   CLINICAL DATA:  Respiratory failure  EXAM: PORTABLE CHEST - 1 VIEW  COMPARISON:  01/11/2015  FINDINGS: The endotracheal tube tip is 3 cm above the carina. There is a right jugular central line with tip in the SVC. The nasogastric tube extends below the diaphragm and off the inferior edge of the image. Central airspace opacities persist bilaterally with mild worsening. No pneumothorax.  IMPRESSION: Support equipment appears satisfactorily positioned.  Worsening airspace opacities bilaterally.   Electronically Signed   By: Ellery Plunk M.D.   On: 01/12/2015 06:40   Dg Chest Port 1 View  01/11/2015   CLINICAL DATA:  Respiratory failure  EXAM: PORTABLE CHEST - 1 VIEW  COMPARISON:  01/10/2015  FINDINGS: The endotracheal tube tip is 4.3 cm above the carina. The right jugular central line extends into the SVC. The nasogastric tube extends into the stomach and off the inferior edge of the image.  There are worsened central airspace opacities bilaterally, now with more confluent consolidation. There is no pneumothorax. There is no large effusion.  IMPRESSION: Support equipment appears satisfactorily positioned.  Worsened central airspace opacities.   Electronically Signed   By: Ellery Plunk M.D.   On: 01/11/2015 06:55   Dg Chest Port 1 View  01/10/2015   CLINICAL DATA:  Respiratory failure.  Shortness of breath.  EXAM: PORTABLE CHEST - 1 VIEW   COMPARISON:  01/08/2015.  FINDINGS: Endotracheal tube, NG tube, right IJ line in stable position. Stable cardiomegaly. Diffuse bilateral pulmonary alveolar infiltrates are unchanged. No pleural effusion or pneumothorax.  IMPRESSION: 1. Lines and tubes in stable position. 2. Cardiomegaly with diffuse bilateral pulmonary infiltrates consistent with pulmonary edema and/or bilateral pneumonia. No interim change.   Electronically Signed   By: Maisie Fus  Register   On: 01/10/2015 07:13   Dg Chest Port 1 View  01/09/2015   CLINICAL DATA:  RIGHT central venous line placement.  EXAM: PORTABLE CHEST - 1 VIEW  COMPARISON:  Portable film earlier in the day.  FINDINGS: Unchanged endotracheal tube. RIGHT IJ catheter has been placed and lies with its tip in the proximal SVC. No visible pneumothorax. BILATERAL airspace opacities persist without significant improvement. Retrocardiac density remains concerning for LEFT lower lobe atelectasis or infiltrate. Orogastric tube choroidal than stomach. Stable cardiomediastinal silhouette.  IMPRESSION: RIGHT IJ catheter proximal SVC. No pneumothorax. It is stable airspace opacities and retrocardiac density.   Electronically Signed   By: Davonna Belling M.D.   On: 01/09/2015 00:34   Dg Chest Port 1 View  01/08/2015   CLINICAL DATA:  Endotracheal and nasogastric tube placement.  EXAM: PORTABLE CHEST - 1 VIEW  COMPARISON:  Earlier the same date.  FINDINGS: 1836 hours. The endotracheal tube is unchanged within the mid trachea. A nasogastric tube projects below the diaphragm. The heart size and mediastinal contours are stable. There are worsening bilateral perihilar airspace opacities with some volume loss in the retrocardiac left lower lobe. No pneumothorax or significant pleural effusion identified. No evidence of rib fracture.  IMPRESSION: The endotracheal and nasogastric tubes remain satisfactorily positioned. Worsening bilateral airspace opacities.   Electronically Signed   By: Carey Bullocks  M.D.   On: 01/08/2015 18:52   Dg Chest Port 1 View  01/08/2015   CLINICAL DATA:  Cardiopulmonary arrest playing soccer. Initial encounter.  EXAM: PORTABLE  CHEST - 1 VIEW  COMPARISON:  None.  FINDINGS: 1532 hours. Endotracheal tube tip is in the midtrachea, 3.0 cm above the carina. A nasogastric tube extends into the stomach, tip not visualized. No definite central venous catheter identified. There are numerous telemetry leads overlying the chest. There are low lung volumes with left-greater-than-right perihilar airspace opacities. No pleural effusion or pneumothorax identified. The heart size mediastinal contours are normal allowing for mild patient rotation. No rib fractures identified.  IMPRESSION: Endotracheal and nasogastric tubes appears satisfactorily positioned. Left-greater-than-right perihilar pulmonary opacities may reflect edema or aspiration. No evidence of rib fracture or pneumothorax.   Electronically Signed   By: Carey Bullocks M.D.   On: 01/08/2015 15:44   Dg Abd Portable 1v  01/19/2015   CLINICAL DATA:  OG tube placement.  EXAM: PORTABLE ABDOMEN - 1 VIEW  COMPARISON:  01/08/2015  FINDINGS: Enteric tube tip is in the left upper quadrant consistent with location in the body of the stomach. Visualized bowel gas pattern is normal.  IMPRESSION: Enteric tube tip is in the left upper quadrant consistent with location in the body of the stomach.   Electronically Signed   By: Burman Nieves M.D.   On: 01/19/2015 06:53   Dg Abd Portable 1v  01/08/2015   CLINICAL DATA:  Endotracheal and nasogastric tube placement.  EXAM: PORTABLE ABDOMEN - 1 VIEW  COMPARISON:  Chest radiograph same date.  FINDINGS: 1840 hours. Nasogastric tube extends into the distal stomach. There are minimally prominent mid small bowel loops. There is gas within the stomach and colon. No bowel wall thickening or supine evidence of free intraperitoneal air. No acute osseous abnormalities identified.  IMPRESSION: Nasogastric tube tip  in the distal stomach. Mild nonspecific small bowel dilatation.   Electronically Signed   By: Carey Bullocks M.D.   On: 01/08/2015 18:54   Dg Swallowing Func-speech Pathology  01/21/2015    Objective Swallowing Evaluation:    Patient Details  Name: Alexander Berry MRN: [id number] Date of Birth: 11/12/1965  Today's Date: 01/21/2015 Time: SLP Start Time (ACUTE ONLY): 1119-SLP Stop Time (ACUTE ONLY): 1136 SLP Time Calculation (min) (ACUTE ONLY): 17 min  Past Medical History:  Past Medical History  Diagnosis Date  . Hypertension    Past Surgical History:  Past Surgical History  Procedure Laterality Date  . Cardiac catheterization N/A 01/08/2015    Procedure: Left Heart Cath and Coronary Angiography;  Surgeon: Lennette Bihari, MD;  Location: [facility] INVASIVE CV LAB;  Service: Cardiovascular;   Laterality: N/A;  . Cardiac catheterization  01/08/2015    Procedure: Coronary Stent Intervention;  Surgeon: Lennette Bihari, MD;   Location: [facility] INVASIVE CV LAB;  Service: Cardiovascular;;  . Cardiac catheterization  01/08/2015    Procedure: Coronary Balloon Angioplasty;  Surgeon: Lennette Bihari, MD;   Location: [facility] INVASIVE CV LAB;  Service: Cardiovascular;;  . Electrophysiologic study  01/08/2015    Procedure: Cardioversion;  Surgeon: Lennette Bihari, MD;  Location: [facility]  INVASIVE CV LAB;  Service: Cardiovascular;;   HPI:  Other Pertinent Information: 50 y/o male with no prior cardiac history and  HTN admitted after cardiac arrest while playing soccer. Intubated 6/5,  self extubated 6/9, re-intubated 6/9-6/16, stent placement, ARDS. CXDR  Persistent unchanged diffuse bilateral pulmonary infiltrates.  No Data Recorded  Assessment / Plan / Recommendation CHL IP CLINICAL IMPRESSIONS 01/21/2015  Therapy Diagnosis Mild oral phase dysphagia;Mild pharyngeal phase  dysphagia  Clinical Impression Pt presenting with acute reversible oral pharyngeal  dysphagia  2 days post 12 day intubation period. Pt appears slightly  lethargic with mild weakness. Pt with  delayed swallow with all  consistencies triggering at the level of the valleculae.  Silent  aspiration seen x1 following thin liquid by cup. Cueing for volitional  cough did not clear the airway of the aspirates. Additional thin liquid  trials noted flash penetration.  Chin tuck maneuver instructed with the  use of the patients son for translation, however pt unable to follow  commands for adequate implementation. Pt with better airway protection of  nectar thick liquids with no penetration or aspiraiton evidenced  throughout the study. Fatigue seen with regular textures including  prolonged mastication and piece meal deglutiton. Recommend dysphagia 2  diet with nectar thick liquids. Medicines whole with puree.  Anticipate as  mentation and strenght improve pt will safely tolerate upgraded diet  trials. ST follow up warranted.          CHL IP TREATMENT RECOMMENDATION 01/21/2015  Treatment Recommendations Therapy as outlined in treatment plan below     CHL IP DIET RECOMMENDATION 01/21/2015  SLP Diet Recommendations Nectar;Dysphagia 2 (Fine chop)  Liquid Administration via (None)  Medication Administration Whole meds with puree  Compensations Slow rate;Small sips/bites;Follow solids with  liquid;Minimize environmental distractions  Postural Changes and/or Swallow Maneuvers (None)     CHL IP OTHER RECOMMENDATIONS 01/21/2015  Recommended Consults (None)  Oral Care Recommendations Oral care BID  Other Recommendations Order thickener from pharmacy     No flowsheet data found.   CHL IP FREQUENCY AND DURATION 01/21/2015  Speech Therapy Frequency (ACUTE ONLY) min 2x/week  Treatment Duration 2 weeks     Pertinent Vitals/Pain     SLP Swallow Goals No flowsheet data found.  No flowsheet data found.    CHL IP REASON FOR REFERRAL 01/21/2015  Reason for Referral Objectively evaluate swallowing function                 Marcene Duos MA, CCC-SLP Acute Care Speech Language Pathologist    Kennieth Rad 01/21/2015, 12:04 PM    US  Abdomen Limited Ruq  01/13/2015   CLINICAL DATA:  Hyperbilirubinemia  EXAM: US ABDOMEN LIMITED - RIGHT UPPER QUADRANT  COMPARISON:  None.  FINDINGS: Gallbladder:  Sludge noted within the gallbladder. No stones. No wall thickening. Negative sonographic Murphy's.  Common bile duct:  Diameter: Normal caliber, 2 mm.  Liver:  Increased echotexture throughout the liver suggesting fatty infiltration. No focal abnormality or biliary ductal dilatation.  IMPRESSION: Sludge within the gallbladder.  No visible stones.  Increased echotexture throughout the liver compatible with fatty infiltration.   Electronically Signed   By: Charlett Nose M.D.   On: 01/13/2015 17:01    Microbiology: Recent Results (from the past 240 hour(s))  Culture, blood (routine x 2)     Status: None   Collection Time: 01/17/15 10:12 PM  Result Value Ref Range Status   Specimen Description BLOOD RIGHT HAND  Final   Special Requests BOTTLES DRAWN AEROBIC ONLY 4.5CC PT ON ROCEPHIN  Final   Culture   Final    NO GROWTH 5 DAYS Performed at [facility]    Report Status 01/24/2015 FINAL  Final  Culture, blood (routine x 2)     Status: None   Collection Time: 01/17/15 10:15 PM  Result Value Ref Range Status   Specimen Description BLOOD RIGHT ARM  Final   Special Requests   Final    BOTTLES DRAWN AEROBIC AND ANAEROBIC 10CC  EACH ROCEPHIN   Culture   Final    NO GROWTH 5 DAYS Note: Culture results may be compromised due to an excessive volume of blood received in culture bottles. Performed at [facility]    Report Status 01/24/2015 FINAL  Final     Labs: Basic Metabolic Panel:  Recent Labs Lab 01/18/15 0430 01/19/15 0330 01/20/15 0345 01/21/15 0330 01/22/15 1230 01/23/15 0250 01/24/15 0430  NA 148* 143 145 145 148* 149* 139  K 3.7 3.6 3.6 3.1* 3.7 4.0 3.9  CL 112* 101 106 110 118* 119* 112*  CO2 28 30 27 25  21* 20* 21*  GLUCOSE 116* 110* 115* 113* 121* 111* 106*  BUN 46* 43* 42* 46* 41* 45* 32*   CREATININE 1.54* 1.29* 1.30* 1.30* 1.22 1.07 0.96  CALCIUM 7.9* 7.9* 8.6* 8.6* 8.5* 8.9 8.1*  MG 2.3 2.1 2.3  --   --  2.4  --   PHOS 3.8 5.1* 3.8  --   --   --   --    Liver Function Tests:  Recent Labs Lab 01/23/15 0250 01/24/15 0430  AST 67* 43*  ALT 80* 62  ALKPHOS 112 86  BILITOT 1.6* 1.7*  PROT 6.6 5.6*  ALBUMIN 2.5* 2.4*   No results for input(s): LIPASE, AMYLASE in the last 168 hours. No results for input(s): AMMONIA in the last 168 hours. CBC:  Recent Labs Lab 01/18/15 0430 01/19/15 0330 01/20/15 0345 01/21/15 0330 01/23/15 0250  WBC 13.9* 15.3* 20.1* 13.6* 11.3*  NEUTROABS  --   --   --  11.9* 9.2*  HGB 7.3* 7.1* 9.6* 9.4* 10.0*  HCT 23.0* 22.3* 29.5* 28.8* 33.0*  MCV 87.5 87.1 87.0 87.3 91.7  PLT 160 207 391 531* 749*   Cardiac Enzymes:  Recent Labs Lab 01/20/15 0345  CKTOTAL 213  CKMB 5.5*   BNP: BNP (last 3 results) No results for input(s): BNP in the last 8760 hours.  ProBNP (last 3 results) No results for input(s): PROBNP in the last 8760 hours.  CBG:  Recent Labs Lab 01/22/15 2013 01/23/15 0014 01/23/15 0351 01/23/15 0749 01/23/15 1132  GLUCAP 132* 113* 102* 99 107*       Signed:  Carolyne Littles, MD [facility] [phone] pager

## 2015-01-24 NOTE — H&P (View-Only) (Signed)
Physical Medicine and Rehabilitation Admission H&P    Chief Complaint  Patient presents with  . Code STEMI  : HPI: Alexander Berry is a 49 y.o. right handed male with history of hypertension. 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 Admitted 01/08/2015 after witnessed out of hospital cardiac arrest. He was playing soccer when he suddenly collapsed. Bystander CPR was performed. EMS arrival patient was V. fib. He received epinephrine and amiodarone. He was intubated in the field. EKG showed global ST elevations. Underwent emergent cardiac catheterization with stenting and placed on Brilinta. Echocardiogram with ejection fraction of 60% and mild posterior wall hypokinesis. 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 Cranial CT scan negative. Hospital course respiratory failure as well as acute renal failure creatinine 2.98 with follow-up critical care medicine. Patient self extubated 01/12/2015. Developed persistent fevers suspect sepsis maintained on broad-spectrum antibodies with respiratory cultures 01/11/2015 showing MSSA pneumonia and course of Ancef has been completed. Blood cultures have been negative.. Right upper quadrant ultrasound showed biliary sludge no obstruction. Subcutaneous Lovenox for DVT prophylaxis. Dysphagia #2 nectar thick liquid diet and later advanced to a regular consistency diet. 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 Renal function has improved creatinine 1.07. Physical therapy evaluation completed with recommendations of physical medicine rehabilitation consult  ROS Review of Systems with translator assistance Constitutional: Positive for fever. Negative for malaise/fatigue.  Eyes: Negative for blurred vision and double vision.  Respiratory: Negative for cough and shortness of breath.    Cardiovascular: Negative for palpitations and leg swelling.  Gastrointestinal: Positive for constipation.  Genitourinary: Negative for dysuria and hematuria.  Musculoskeletal: Negative for myalgias and joint pain.  Skin: Negative for rash.  Neurological: Negative for dizziness and headaches  Past Medical History  Diagnosis Date  . Hypertension    Past Surgical History  Procedure Laterality Date  . Cardiac catheterization N/A 01/08/2015    Procedure: Left Heart Cath and Coronary Angiography;  Surgeon: Troy Sine, MD;  Location: [place] CV LAB;  Service: Cardiovascular;  Laterality: N/A;  . Cardiac catheterization  01/08/2015    Procedure: Coronary Stent Intervention;  Surgeon: Troy Sine, MD;  Location: [place] CV LAB;  Service: Cardiovascular;;  . Cardiac catheterization  01/08/2015    Procedure: Coronary Balloon Angioplasty;  Surgeon: Troy Sine, MD;  Location: [place] CV LAB;  Service: Cardiovascular;;  . Electrophysiologic study  01/08/2015    Procedure: Cardioversion;  Surgeon: Troy Sine, MD;  Location: [place] CV LAB;  Service: Cardiovascular;;   Family History  Problem Relation Age of Onset  . Hypertension Father    Social History:  has no tobacco, alcohol, and drug history on file. Allergies: No Known Allergies No prescriptions prior to admission    Home: Home Living Family/patient expects to be discharged to:: Private residence Living Arrangements: Spouse/significant other, Children Available Help at Discharge: Family, Available 24 hours/day Type of Home: House Home Access: Stairs to enter Technical brewer of Steps: 3 Home Layout: One level Home Equipment: None Additional Comments: pt works in International aid/development worker History: Prior Function Level of Independence: Independent  Functional Status:  Mobility: Bed Mobility Overal bed mobility: Needs Assistance Bed Mobility: Rolling, Sidelying to Sit Rolling: Mod  assist Sidelying to sit: Max assist General bed mobility comments: in chair beginning and end of session Transfers Overall transfer level: Needs assistance Transfers: Sit to/from Stand,  Squat Pivot Transfers Sit to Stand: Min assist Squat pivot transfers: Max assist, +2 physical assistance General transfer comment: over 4 trials pt progressed from min assist to minguard with cues for hand placement, scooting and safety. Posterior lean with first 3 trials on initial standing Ambulation/Gait Ambulation/Gait assistance: Min assist, +2 safety/equipment Ambulation Distance (Feet): 54 Feet Assistive device: Rolling walker (2 wheeled) General Gait Details: Pt performed 4 gait trials of 24', 36', 50', 54' respectively with seated rest between each trial. Pt with increased sway and utlization of right quadratus lumborum for gait with scissoring with gait. Cues for posture, position in RW and looking up Gait Pattern/deviations: Step-through pattern, Decreased stride length, Narrow base of support Gait velocity interpretation: Below normal speed for age/gender    ADL: ADL Overall ADL's : Needs assistance/impaired Eating/Feeding: NPO Grooming: Wash/dry hands, Wash/dry face, Oral care, Moderate assistance, Sitting Upper Body Bathing: Maximal assistance, Sitting Lower Body Bathing: Total assistance, Bed level Upper Body Dressing : Moderate assistance, Sitting Lower Body Dressing: Total assistance, +2 for physical assistance, Bed level Toilet Transfer: +2 for safety/equipment, Maximal assistance, Squat-pivot Toileting- Clothing Manipulation and Hygiene: +2 for physical assistance, Total assistance  Cognition: Cognition Overall Cognitive Status: Within Functional Limits for tasks assessed Orientation Level: Oriented to person, Oriented to place, Oriented to time, Disoriented to situation Cognition Arousal/Alertness: Awake/alert Behavior During Therapy: WFL for tasks assessed/performed Overall  Cognitive Status: Within Functional Limits for tasks assessed Area of Impairment: Orientation, Following commands, Safety/judgement, Problem solving Orientation Level: Disoriented to, Time, Situation, Place Memory: Decreased short-term memory Following Commands: Follows one step commands with increased time, Follows one step commands inconsistently Safety/Judgement: Decreased awareness of safety, Decreased awareness of deficits Problem Solving: Slow processing, Requires tactile cues, Requires verbal cues General Comments: Pt thought he was in [employer]. Attempting to stand stating he wanted to walk.  Physical Exam: Blood pressure 116/68, pulse 81, temperature 98.6 F (37 C), temperature source Oral, resp. rate 16, height 5' 7"  (1.702 m), weight 93.21 kg (205 lb 7.9 oz), SpO2 98 %. Physical Exam Constitutional: He appears well-developed. No disress HENT: dentition fair Head: Normocephalic.  Eyes: EOM are normal.  Neck: Normal range of motion. Neck supple. No thyromegaly present.  Cardiovascular: Normal rate and regular rhythm.  Respiratory: Effort normal and breath sounds normal. No respiratory distress.  GI: Soft. Bowel sounds are normal. He exhibits no distension.  Neurological: He is alert.  Limited English. He was able to follow simple commands and answer simple yes no questions. UE 4/5 prox to distal. LE: 2/5 hf, 3/5ke, 4/5 ankles, some pain limitations  Skin: Lateral three toes on left foot are starting to demarcate. He has pedal pulses with tips of toes significant ischemic changes, especially lateral 2. Toes are tender to touch. Some bruising over the knees/shins Psychiatric: He has a normal mood and affect. His behavior is normal  Results for orders placed or performed during the hospital encounter of 01/08/15 (from the past 48 hour(s))  Glucose, capillary     Status: Abnormal   Collection Time: 01/21/15  1:08 PM  Result Value Ref Range   Glucose-Capillary 133 (H) 65 - 99  mg/dL   Comment 1 Capillary Specimen   Glucose, capillary     Status: Abnormal   Collection Time: 01/21/15  6:03 PM  Result Value Ref Range   Glucose-Capillary 113 (H) 65 - 99 mg/dL   Comment 1 Capillary Specimen   Glucose, capillary     Status: Abnormal   Collection Time: 01/21/15  7:33 PM  Result Value Ref Range   Glucose-Capillary 123 (H) 65 - 99 mg/dL  Glucose, capillary     Status: Abnormal   Collection Time: 01/21/15 11:06 PM  Result Value Ref Range   Glucose-Capillary 110 (H) 65 - 99 mg/dL   Comment 1 Capillary Specimen   Glucose, capillary     Status: None   Collection Time: 01/22/15  3:27 AM  Result Value Ref Range   Glucose-Capillary 93 65 - 99 mg/dL  Glucose, capillary     Status: Abnormal   Collection Time: 01/22/15  7:35 AM  Result Value Ref Range   Glucose-Capillary 100 (H) 65 - 99 mg/dL   Comment 1 Capillary Specimen   Glucose, capillary     Status: Abnormal   Collection Time: 01/22/15 11:18 AM  Result Value Ref Range   Glucose-Capillary 123 (H) 65 - 99 mg/dL   Comment 1 Capillary Specimen   Basic metabolic panel     Status: Abnormal   Collection Time: 01/22/15 12:30 PM  Result Value Ref Range   Sodium 148 (H) 135 - 145 mmol/L   Potassium 3.7 3.5 - 5.1 mmol/L   Chloride 118 (H) 101 - 111 mmol/L   CO2 21 (L) 22 - 32 mmol/L   Glucose, Bld 121 (H) 65 - 99 mg/dL   BUN 41 (H) 6 - 20 mg/dL   Creatinine, Ser 1.22 0.61 - 1.24 mg/dL   Calcium 8.5 (L) 8.9 - 10.3 mg/dL   GFR calc non Af Amer >60 >60 mL/min   GFR calc Af Amer >60 >60 mL/min    Comment: (NOTE) The eGFR has been calculated using the CKD EPI equation. This calculation has not been validated in all clinical situations. eGFR's persistently <60 mL/min signify possible Chronic Kidney Disease.    Anion gap 9 5 - 15  Glucose, capillary     Status: Abnormal   Collection Time: 01/22/15  4:21 PM  Result Value Ref Range   Glucose-Capillary 102 (H) 65 - 99 mg/dL   Comment 1 Capillary Specimen   Glucose,  capillary     Status: Abnormal   Collection Time: 01/22/15  8:13 PM  Result Value Ref Range   Glucose-Capillary 132 (H) 65 - 99 mg/dL  Glucose, capillary     Status: Abnormal   Collection Time: 01/23/15 12:14 AM  Result Value Ref Range   Glucose-Capillary 113 (H) 65 - 99 mg/dL  Comprehensive metabolic panel     Status: Abnormal   Collection Time: 01/23/15  2:50 AM  Result Value Ref Range   Sodium 149 (H) 135 - 145 mmol/L   Potassium 4.0 3.5 - 5.1 mmol/L   Chloride 119 (H) 101 - 111 mmol/L   CO2 20 (L) 22 - 32 mmol/L   Glucose, Bld 111 (H) 65 - 99 mg/dL   BUN 45 (H) 6 - 20 mg/dL   Creatinine, Ser 1.07 0.61 - 1.24 mg/dL   Calcium 8.9 8.9 - 10.3 mg/dL   Total Protein 6.6 6.5 - 8.1 g/dL   Albumin 2.5 (L) 3.5 - 5.0 g/dL   AST 67 (H) 15 - 41 U/L   ALT 80 (H) 17 - 63 U/L   Alkaline Phosphatase 112 38 - 126 U/L   Total Bilirubin 1.6 (H) 0.3 - 1.2 mg/dL   GFR calc non Af Amer >60 >60 mL/min   GFR calc Af Amer >60 >60 mL/min    Comment: (NOTE) The eGFR has been calculated using the CKD EPI equation. This calculation has  not been validated in all clinical situations. eGFR's persistently <60 mL/min signify possible Chronic Kidney Disease.    Anion gap 10 5 - 15  Magnesium     Status: None   Collection Time: 01/23/15  2:50 AM  Result Value Ref Range   Magnesium 2.4 1.7 - 2.4 mg/dL  CBC with Differential/Platelet     Status: Abnormal   Collection Time: 01/23/15  2:50 AM  Result Value Ref Range   WBC 11.3 (H) 4.0 - 10.5 K/uL   RBC 3.60 (L) 4.22 - 5.81 MIL/uL   Hemoglobin 10.0 (L) 13.0 - 17.0 g/dL   HCT 33.0 (L) 39.0 - 52.0 %   MCV 91.7 78.0 - 100.0 fL   MCH 27.8 26.0 - 34.0 pg   MCHC 30.3 30.0 - 36.0 g/dL   RDW 17.2 (H) 11.5 - 15.5 %   Platelets 749 (H) 150 - 400 K/uL   Neutrophils Relative % 82 (H) 43 - 77 %   Neutro Abs 9.2 (H) 1.7 - 7.7 K/uL   Lymphocytes Relative 9 (L) 12 - 46 %   Lymphs Abs 1.0 0.7 - 4.0 K/uL   Monocytes Relative 7 3 - 12 %   Monocytes Absolute 0.8 0.1 -  1.0 K/uL   Eosinophils Relative 2 0 - 5 %   Eosinophils Absolute 0.2 0.0 - 0.7 K/uL   Basophils Relative 0 0 - 1 %   Basophils Absolute 0.0 0.0 - 0.1 K/uL  Glucose, capillary     Status: Abnormal   Collection Time: 01/23/15  3:51 AM  Result Value Ref Range   Glucose-Capillary 102 (H) 65 - 99 mg/dL   Comment 1 Capillary Specimen   Glucose, capillary     Status: None   Collection Time: 01/23/15  7:49 AM  Result Value Ref Range   Glucose-Capillary 99 65 - 99 mg/dL   Comment 1 Capillary Specimen    No results found.     Medical Problem List and Plan: 1. Functional deficits secondary to debilitation after cardiac arrest status post stenting/potential anoxic encephalopathy 2.  DVT Prophylaxis/Anticoagulation: Subcutaneous Lovenox. Monitor platelet counts of any signs of bleeding 3. Pain Management: Tylenol as needed 4. Acute renal insufficiency. Creatinine rebounding to 1.07. Follow-up chemistries 5. Neuropsych: This patient is capable of making decisions on his own behalf. 6. Skin/Wound Care: Routine skin checks 7. Fluids/Electrolytes/Nutrition: Strict I&O with follow-up chemistries 8. Gangrenous ischemic toes. Follow-up vascular surgery. Significant demarcation on the left. 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 MSSA pneumonia. Patient has completed course of Ancef 10. Hypertension. Norvasc 5 mg daily, Coreg 18.75 mg twice a day, lisinopril 10 mg daily, Aldactone 12.5 mg daily. Monitor with increased mobility   Post Admission Physician Evaluation: 1. Functional deficits secondary  to debility/anoxic BI after cardiac arrest. 2. Patient is admitted to receive collaborative, interdisciplinary care between the physiatrist, rehab nursing staff, and therapy team. 3. Patient's level of medical complexity and substantial therapy needs in context of that medical necessity cannot be provided at a lesser intensity of care such as a SNF. 4. Patient has experienced substantial functional loss from his/her  baseline which was documented above under the "Functional History" and "Functional Status" headings.  Judging by the patient's diagnosis, physical exam, and functional history, the patient has potential for functional progress which will result in measurable gains while on inpatient rehab.  These gains will be of substantial and practical use upon discharge  in facilitating mobility and self-care at the household level. 5. Physiatrist will provide 24  hour management of medical needs as well as oversight of the therapy plan/treatment and provide guidance as appropriate regarding the interaction of the two. 6. 24 hour rehab nursing will assist with bladder management, bowel management, safety, skin/wound care, disease management, medication administration, pain management and patient education  and help integrate therapy concepts, techniques,education, etc. 7. PT will assess and treat for/with: Lower extremity strength, range of motion, stamina, balance, functional mobility, safety, adaptive techniques and equipment, NMR, pain mgt, weight bearing, family education.   Goals are: supervision. 8. OT will assess and treat for/with: ADL's, functional mobility, safety, upper extremity strength, adaptive techniques and equipment, NMR, pain mgt, family ed, ego support.   Goals are: supervision. Therapy may proceed with showering this patient. 9. SLP will assess and treat for/with: cognition, communication.  Goals are: mod I. 10. Case Management and Social Worker will assess and treat for psychological issues and discharge planning. 11. Team conference will be held weekly to assess progress toward goals and to determine barriers to discharge. 12. Patient will receive at least 3 hours of therapy per day at least 5 days per week. 13. ELOS: 7-12 days       14. Prognosis:  excellent     Meredith Staggers, MD, [facility] 01/24/2015   01/23/2015

## 2015-01-24 NOTE — Care Management Note (Signed)
Case Management Note  Patient Details  Name: Alexander Berry MRN: [id number] Date of Birth: March 05, 1966  Subjective/Objective: Pt plan for d/c today to [facility].                     Action/Plan: No further needs from CM at this time.    Expected Discharge Date:                  Expected Discharge Plan:  Home w Home Health Services  In-House Referral:     Discharge planning Services  CM Consult, [facility], [facility], Medication Assistance  Post Acute Care Choice:    Choice offered to:     DME Arranged:    DME Agency:     HH Arranged:    HH Agency:     Status of Service:  Completed, signed off  Medicare Important Message Given:  No Date Medicare IM Given:    Medicare IM give by:    Date Additional Medicare IM Given:    Additional Medicare Important Message give by:     If discussed at Long Length of Stay Meetings, dates discussed:    Additional Comments:  Gala Lewandowsky, RN 01/24/2015, 11:47 AM

## 2015-01-24 NOTE — Progress Notes (Signed)
       Patient Name: Alexander Berry Date of Encounter: 01/24/2015    SUBJECTIVE: Family at bedside. He has no cardiac complaints.  TELEMETRY:  NSR with no ectopy Filed Vitals:   01/23/15 1610 01/23/15 1708 01/23/15 2038 01/24/15 0505  BP: 121/59 100/62 114/60 114/57  Pulse: 86  71 71  Temp: 98.5 F (36.9 C) 98.5 F (36.9 C) 98.6 F (37 C) 98.5 F (36.9 C)  TempSrc: Oral Oral Oral Oral  Resp: 21 18 18 18   Height:      Weight:    85.594 kg (188 lb 11.2 oz)  SpO2: 99% 97% 100% 97%    Intake/Output Summary (Last 24 hours) at 01/24/15 1045 Last data filed at 01/24/15 0950  Gross per 24 hour  Intake 1896.25 ml  Output    600 ml  Net 1296.25 ml   LABS: Basic Metabolic Panel:  Recent Labs  16/10/96 0250 01/24/15 0430  NA 149* 139  K 4.0 3.9  CL 119* 112*  CO2 20* 21*  GLUCOSE 111* 106*  BUN 45* 32*  CREATININE 1.07 0.96  CALCIUM 8.9 8.1*  MG 2.4  --    CBC:  Recent Labs  01/23/15 0250  WBC 11.3*  NEUTROABS 9.2*  HGB 10.0*  HCT 33.0*  MCV 91.7  PLT 749*   ABI : ABI is normal. Radiology/Studies:  No new data  Physical Exam: Blood pressure 114/57, pulse 71, temperature 98.5 F (36.9 C), temperature source Oral, resp. rate 18, height 5\' 7"  (1.702 m), weight 85.594 kg (188 lb 11.2 oz), SpO2 97 %. Weight change:   Wt Readings from Last 3 Encounters:  01/24/15 85.594 kg (188 lb 11.2 oz)    Chest is clear No rub or gallop on cardiac exam. LLE with all five toes with some area of necrosis with the lateral three most effected. Right foot with sichemic changes that appear focal and recoverable.   ASSESSMENT:  1. S/p inferior MI with VF arrest. RCA successfully stented 2. Acute diastolic heart failure, with no evidence of volume overload. 3. Anoxic encephalopathy, improving 4. Ischemic left foot. 5. Azotemia  Plan:  1. DAPT for 1 year 2. Beta blocker, ACE, and high dose statin. 3. May be able to sacrifice amlodipine if BP gets too low. 4. Stop  aldactone 5. Ready for CIR from CV standpoint.  Selinda Eon 01/24/2015, 10:45 AM

## 2015-01-24 NOTE — Progress Notes (Signed)
CSW (Clinical Child psychotherapist) received referral for PCP and health insurance. Referral more appropriate for RNCM. RNCM already aware of pt needs. Pt has no hospital social work needs at this time.  Loistine Eberlin, LCSWA [phone]

## 2015-01-24 NOTE — Progress Notes (Signed)
VASCULAR LAB PRELIMINARY  ARTERIAL  ABI completed:  Normal study.   RIGHT    LEFT    PRESSURE WAVEFORM  PRESSURE WAVEFORM  BRACHIAL 111 Triphasic BRACHIAL 120 Triphasic  AT   AT    PT 122 Triphasic PT 132 Triphasic  PER   PER      RIGHT LEFT  ABI 1.1 1.1     Devante Capano D, RVT 01/24/2015, 8:54 AM

## 2015-01-24 NOTE — PMR Pre-admission (Signed)
PMR Admission Coordinator Pre-Admission Assessment  Patient: Alexander Berry is an 49 y.o., male MRN: [id number] DOB: [birth year] Height:  (170.2 cm) Weight: 85.594 kg (188 lb 11.2 oz)              Insurance Information HMO:     PPO:      PCP:      IPA:      80/20:      OTHER:  PRIMARY:    Self pay (per the family, pt. Is an illegal resident)   Policy#:        Subscriber:  CM Name:       Phone#:      Fax#:  Pre-Cert#:       Employer:  Benefits:  Phone #:      Name:  Eff. Date:      Deduct:       Out of Pocket Max:       Life Max:  CIR:       SNF:  Outpatient:      Co-Pay:  Home Health:       Co-Pay:  DME:      Co-Pay:  Providers:    Medicaid Application Date:       Case Manager:  Disability Application Date:       Case Worker:   Emergency Contact Information Contact Information    Name Relation Home Work Mobile   Cothran-Perez,Jorge Son [phone]     Knoch,Mayra Daughter [phone]     Arnett,Veronica    [phone]     Current Medical History  Patient Admitting Diagnosis: debility after cardiac arrest, potential anoxic encephalopathy History of Present Illness: [name] is a 49 y.o. right handed male with history of hypertension. Independent prior to admission living with wife and family work in [employer]. Admitted 01/08/2015 after witnessed out of hospital cardiac arrest. He was playing soccer when he suddenly collapsed. Bystander CPR was performed. EMS arrival patient was V. fib. He received epinephrine and amiodarone. He was intubated in the field. EKG showed global ST elevations. Underwent emergent cardiac catheterization with stenting and placed on Brilinta. Echocardiogram with ejection fraction of 60% and mild posterior wall hypokinesis. 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 Cranial CT scan negative. Hospital course respiratory failure as well as acute renal failure creatinine 2.98  with follow-up critical care medicine. Patient self extubated 01/12/2015. Developed persistent fevers suspect sepsis maintained on broad-spectrum antibodies with respiratory cultures 01/11/2015 showing MSSA pneumonia and course of Ancef has been completed. Blood cultures have been negative.. Right upper quadrant ultrasound showed biliary sludge no obstruction. Subcutaneous Lovenox for DVT prophylaxis. Dysphagia #2 nectar thick liquid diet and later advanced to a regular consistency diet. 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 Renal function has improved creatinine 1.07. Physical therapy evaluation completed with recommendations of physical medicine rehabilitation consult      Past Medical History  Past Medical History  Diagnosis Date  . Hypertension     Family History  family history includes Hypertension in his father.  Prior Rehab/Hospitalizations:  Has the patient had major surgery during 100 days prior to admission? No  Current Medications   Current facility-administered medications:  .  acetaminophen (TYLENOL) tablet 650 mg, 650 mg, Oral, Q4H PRN, Lonia Blood, MD .  amLODipine (NORVASC) tablet 5 mg, 5 mg, Oral, Daily, Dolores Patty, MD, 5 mg at 01/23/15 0857 .  antiseptic oral rinse (  CPC / CETYLPYRIDINIUM CHLORIDE 0.05%) solution 7 mL, 7 mL, Mouth Rinse, BID, Drema Dallas, MD, 7 mL at 01/23/15 1000 .  aspirin chewable tablet 81 mg, 81 mg, Oral, Daily, Lonia Blood, MD, 81 mg at 01/24/15 0959 .  atorvastatin (LIPITOR) tablet 80 mg, 80 mg, Oral, q1800, Lyn Records, MD .  carvedilol (COREG) tablet 18.75 mg, 18.75 mg, Oral, BID WC, Lennette Bihari, MD, 18.75 mg at 01/24/15 0958 .  dextrose 5 % solution, , Intravenous, Continuous, Lonia Blood, MD, Last Rate: 75 mL/hr at 01/24/15 0542 .  docusate (COLACE) 50 MG/5ML liquid 100 mg, 100 mg, Oral, BID PRN, Lonia Blood, MD .  enoxaparin (LOVENOX)  injection 40 mg, 40 mg, Subcutaneous, Q24H, Dolores Patty, MD, 40 mg at 01/23/15 1340 .  famotidine (PEPCID) 40 MG/5ML suspension 20 mg, 20 mg, Oral, Daily, Lonia Blood, MD, 20 mg at 01/24/15 1007 .  feeding supplement (GLUCERNA SHAKE) (GLUCERNA SHAKE) liquid 237 mL, 237 mL, Oral, BID BM, Jenifer A Williams, RD, 237 mL at 01/24/15 1000 .  fentaNYL (SUBLIMAZE) injection 25 mcg, 25 mcg, Intravenous, Q2H PRN, Nelda Bucks, MD, 25 mcg at 01/20/15 0522 .  lisinopril (PRINIVIL,ZESTRIL) tablet 10 mg, 10 mg, Oral, Daily, Dolores Patty, MD, 10 mg at 01/23/15 0858 .  metoprolol (LOPRESSOR) injection 2.5-5 mg, 2.5-5 mg, Intravenous, Q3H PRN, Merwyn Katos, MD, 5 mg at 01/14/15 0341 .  multivitamin with minerals tablet 1 tablet, 1 tablet, Oral, Daily, Jenifer A Williams, RD, 1 tablet at 01/24/15 0958 .  ondansetron (ZOFRAN) injection 4 mg, 4 mg, Intravenous, Q6H PRN, Lennette Bihari, MD, 4 mg at 01/19/15 0430 .  ticagrelor (BRILINTA) tablet 90 mg, 90 mg, Oral, BID, Lonia Blood, MD, 90 mg at 01/24/15 1610  Patients Current Diet: Diet Heart Room service appropriate?: Yes; Fluid consistency:: Thin  Precautions / Restrictions Precautions Precautions: Fall Precaution Comments: gangrenous left  toes (especially 3rd through 5th) Restrictions Weight Bearing Restrictions: No   Has the patient had 2 or more falls or a fall with injury in the past year?No  Prior Activity Level Community (5-7x/wk): Works full time in [employer].  Plays in 2 adult soccer leagues..  Was playing soccer at time of collapse and arrest  Journalist, newspaper / Equipment Home Assistive Devices/Equipment: None Home Equipment: None  Prior Device Use: Indicate devices/aids used by the patient prior to current illness, exacerbation or injury? None of the above  Prior Functional Level Prior Function Level of Independence: Independent  Self Care: Did the patient need help bathing, dressing, using the  toilet or eating?  Independent  Indoor Mobility: Did the patient need assistance with walking from room to room (with or without device)? Independent  Stairs: Did the patient need assistance with internal or external stairs (with or without device)? Independent  Functional Cognition: Did the patient need help planning regular tasks such as shopping or remembering to take medications? Independent  Current Functional Level Cognition  Overall Cognitive Status: Within Functional Limits for tasks assessed Orientation Level: Oriented to person, Oriented to place, Oriented to time, Disoriented to situation Following Commands: Follows one step commands with increased time, Follows one step commands inconsistently Safety/Judgement: Decreased awareness of safety, Decreased awareness of deficits General Comments: Pt thought he was in [employer]. Attempting to stand stating he wanted to walk.    Extremity Assessment (includes Sensation/Coordination)  Upper Extremity Assessment: RUE deficits/detail, LUE deficits/detail RUE Deficits / Details: 3/5 RUE Coordination: decreased  fine motor, decreased gross motor LUE Deficits / Details: 2+/5 LUE Coordination: decreased fine motor, decreased gross motor  Lower Extremity Assessment: Defer to PT evaluation    ADLs  Overall ADL's : Needs assistance/impaired Eating/Feeding: NPO Grooming: Wash/dry hands, Wash/dry face, Oral care, Moderate assistance, Sitting Upper Body Bathing: Maximal assistance, Sitting Lower Body Bathing: Total assistance, Bed level Upper Body Dressing : Moderate assistance, Sitting Lower Body Dressing: Total assistance, +2 for physical assistance, Bed level Toilet Transfer: +2 for safety/equipment, Maximal assistance, Squat-pivot Toileting- Clothing Manipulation and Hygiene: +2 for physical assistance, Total assistance    Mobility  Overal bed mobility: Needs Assistance Bed Mobility: Rolling, Sidelying to Sit Rolling: Mod  assist Sidelying to sit: Max assist General bed mobility comments: in chair beginning and end of session    Transfers  Overall transfer level: Needs assistance Transfers: Sit to/from Stand, Squat Pivot Transfers Sit to Stand: Min assist Squat pivot transfers: Max assist, +2 physical assistance General transfer comment: over 4 trials pt progressed from min assist to minguard with cues for hand placement, scooting and safety. Posterior lean with first 3 trials on initial standing    Ambulation / Gait / Stairs / Wheelchair Mobility  Ambulation/Gait Ambulation/Gait assistance: Min assist, +2 safety/equipment Ambulation Distance (Feet): 54 Feet Assistive device: Rolling walker (2 wheeled) General Gait Details: Pt performed 4 gait trials of 24', 36', 50', 54' respectively with seated rest between each trial. Pt with increased sway and utlization of right quadratus lumborum for gait with scissoring with gait. Cues for posture, position in RW and looking up Gait Pattern/deviations: Step-through pattern, Decreased stride length, Narrow base of support Gait velocity interpretation: Below normal speed for age/gender    Posture / Balance Dynamic Sitting Balance Sitting balance - Comments: flexed posture seated edge of chair with leaning in all directions Balance Overall balance assessment: Needs assistance Sitting-balance support: Feet supported Sitting balance-Leahy Scale: Good Sitting balance - Comments: flexed posture seated edge of chair with leaning in all directions Standing balance-Leahy Scale: Fair    Special needs/care consideration BiPAP/CPAP  no Continuous Drip IV  5%dextrose solution 93mL/hr Oxygen  no Skin left 3rd thru 5th toes demarcating and black in appearance; vascular believes toes will autoamputate ; ABIs normal Bowel mgmt: last BM 01/23/15 on [facility] with assist Bladder mgmt: uses urinal, continent per family Diabetic mgmt no     Previous Home Environment Living  Arrangements: Spouse/significant other, Children Available Help at Discharge: Family, Available 24 hours/day Type of Home: House Home Layout: One level Home Access: Stairs to enter Secretary/administrator of Steps: 3 Bathroom Shower/Tub: Engineer, manufacturing systems: Standard Home Care Services: No Additional Comments: pt works in Social research officer, government for Discharge Living Setting: Patient's home Type of Home at Discharge: House Discharge Home Layout: One level Discharge Home Access: Stairs to enter Entrance Stairs-Rails: None Entrance Stairs-Number of Steps: 4 Discharge Bathroom Shower/Tub: Tub/shower unit, Curtain Discharge Bathroom Toilet: Standard Discharge Bathroom Accessibility: Yes How Accessible: Accessible via walker Does the patient have any problems obtaining your medications?: No  Social/Family/Support Systems Patient Roles: Spouse, Parent Anticipated Caregiver: wife Myrtie Soman and daughter Deforrest Bogle, age 55 will be available 24/7 to assist pt. as necessary Ability/Limitations of Caregiver: none Caregiver Availability: 24/7 Discharge Plan Discussed with Primary Caregiver: Yes Is Caregiver In Agreement with Plan?: Yes Does Caregiver/Family have Issues with Lodging/Transportation while Pt is in Rehab?: No   Goals/Additional Needs Patient/Family Goal for Rehab: supervision for PT/OT; mod I and supervision for SLP  Expected length of stay: 15- 18 days Cultural Considerations: Pt. from [place], pt. 's primary language is Spanish but has some understanding and ability to speak [place] (family estimates 50%) Equipment Needs: TBD Pt/Family Agrees to Admission and willing to participate: Yes Program Orientation Provided & Reviewed with Pt/Caregiver Including Roles  & Responsibilities: Yes Information Needs to be Provided By: Pt's daughter Mayra speaks English fluently and interrprets for her mom and dad   Decrease burden of Care through IP  rehab admission:   no   Possible need for SNF placement upon discharge:    Patient Condition: This patient's condition remains as documented in the consult dated 01/23/15 , in which the Rehabilitation Physician determined and documented that the patient's condition is appropriate for intensive rehabilitative care in an inpatient rehabilitation facility. Will admit to inpatient rehab today.  Preadmission Screen Completed By:  Weldon Picking, 01/24/2015 11:33 AM ______________________________________________________________________   Discussed status with Dr.  Riley Kill on 01/24/15 at  1132  and received telephone approval for admission today.  Admission Coordinator:  Weldon Picking, time 1132 Dorna Bloom 01/24/15

## 2015-01-25 ENCOUNTER — Inpatient Hospital Stay (HOSPITAL_COMMUNITY): Payer: Self-pay | Admitting: Occupational Therapy

## 2015-01-25 ENCOUNTER — Inpatient Hospital Stay (HOSPITAL_COMMUNITY): Payer: Self-pay | Admitting: Speech Pathology

## 2015-01-25 ENCOUNTER — Inpatient Hospital Stay (HOSPITAL_COMMUNITY): Payer: MEDICAID | Admitting: Physical Therapy

## 2015-01-25 LAB — COMPREHENSIVE METABOLIC PANEL
ALK PHOS: 103 U/L (ref 38–126)
ALT: 79 U/L — ABNORMAL HIGH (ref 17–63)
AST: 53 U/L — ABNORMAL HIGH (ref 15–41)
Albumin: 2.8 g/dL — ABNORMAL LOW (ref 3.5–5.0)
Anion gap: 8 (ref 5–15)
BILIRUBIN TOTAL: 1.6 mg/dL — AB (ref 0.3–1.2)
BUN: 26 mg/dL — ABNORMAL HIGH (ref 6–20)
CO2: 21 mmol/L — ABNORMAL LOW (ref 22–32)
Calcium: 8.8 mg/dL — ABNORMAL LOW (ref 8.9–10.3)
Chloride: 110 mmol/L (ref 101–111)
Creatinine, Ser: 1.16 mg/dL (ref 0.61–1.24)
GFR calc non Af Amer: >60 mL/min (ref >60)
GLUCOSE: 97 mg/dL (ref 65–99)
POTASSIUM: 4.1 mmol/L (ref 3.5–5.1)
Sodium: 139 mmol/L (ref 135–145)
Total Protein: 6.8 g/dL (ref 6.5–8.1)

## 2015-01-25 LAB — CBC WITH DIFFERENTIAL/PLATELET
BASOS ABS: 0 10*3/uL (ref 0.0–0.1)
Basophils Relative: 0 % (ref 0–1)
Eosinophils Absolute: 0.2 10*3/uL (ref 0.0–0.7)
Eosinophils Relative: 3 % (ref 0–5)
HCT: 29.8 % — ABNORMAL LOW (ref 39.0–52.0)
Hemoglobin: 9.5 g/dL — ABNORMAL LOW (ref 13.0–17.0)
LYMPHS ABS: 0.9 10*3/uL (ref 0.7–4.0)
LYMPHS PCT: 13 % (ref 12–46)
MCH: 28.1 pg (ref 26.0–34.0)
MCHC: 31.9 g/dL (ref 30.0–36.0)
MCV: 88.2 fL (ref 78.0–100.0)
Monocytes Absolute: 0.5 10*3/uL (ref 0.1–1.0)
Monocytes Relative: 8 % (ref 3–12)
NEUTROS PCT: 76 % (ref 43–77)
Neutro Abs: 5.3 10*3/uL (ref 1.7–7.7)
PLATELETS: 882 10*3/uL — AB (ref 150–400)
RBC: 3.38 MIL/uL — AB (ref 4.22–5.81)
RDW: 15.7 % — AB (ref 11.5–15.5)
WBC: 6.9 10*3/uL (ref 4.0–10.5)

## 2015-01-25 LAB — GLUCOSE, CAPILLARY: Glucose-Capillary: 124 mg/dL — ABNORMAL HIGH (ref 65–99)

## 2015-01-25 NOTE — Evaluation (Signed)
Physical Therapy Assessment and Plan  Patient Details  Name: Alexander Berry MRN: [id number] Date of Birth: 02/09/1966  PT Diagnosis: Abnormal posture, Abnormality of gait, Cognitive deficits, Coordination disorder, Difficulty walking, Impaired cognition, Muscle weakness and Pain in L toes (34d-5th) Rehab Potential:   Good ELOS:   7-10 days  Today's Date: 01/25/2015 PT Individual Time: 0900-1000 Treatment Session 2: 1445-1515 PT Individual Time Calculation (min): 60 min  Treatment Session 2: 30 min  Problem List:  Patient Active Problem List   Diagnosis Date Noted  . Gangrene of toe   . Cardiogenic shock   . Essential hypertension   . Gangrene of foot   . Acute on chronic renal failure   . Hypernatremia   . Shock liver   . Anoxic encephalopathy   . Dysphagia   . Absolute anemia   . Anoxic brain injury   . AKI (acute kidney injury)   . Septic shock 01/13/2015  . ARDS (adult respiratory distress syndrome) 01/13/2015  . Thrombocytopenia 01/13/2015  . Staphylococcal pneumonia 01/13/2015  . Encounter for intubation 01/12/2015  . Pneumonia, organism unspecified   . Acute pulmonary edema   . Hyperglycemia   . Encephalopathy acute   . Cardiac arrest 01/08/2015  . Acute respiratory failure with hypoxia 01/08/2015  . Hypokalemia 01/08/2015  . Ventricular tachycardia   . Acute ST elevation myocardial infarction (STEMI) involving right coronary artery     Past Medical History:  Past Medical History  Diagnosis Date  . Hypertension   . Difficult intubation     difficult airway/FYI 01/08/2015   Past Surgical History:  Past Surgical History  Procedure Laterality Date  . Cardiac catheterization N/A 01/08/2015    Procedure: Left Heart Cath and Coronary Angiography;  Surgeon: Troy Sine, MD;  Location: [place] [facility];  Service: Cardiovascular;  Laterality: N/A;  . Cardiac catheterization  01/08/2015    Procedure: Coronary Stent Intervention;  Surgeon: Troy Sine, MD;   Location: [place] [facility];  Service: Cardiovascular;;  . Cardiac catheterization  01/08/2015    Procedure: Coronary Balloon Angioplasty;  Surgeon: Troy Sine, MD;  Location: [place] [facility];  Service: Cardiovascular;;  . Electrophysiologic study  01/08/2015    Procedure: Cardioversion;  Surgeon: Troy Sine, MD;  Location: [place] [facility];  Service: Cardiovascular;;    Assessment & Plan Clinical Impression:  Alexander Berry is a 49 y.o. right handed male with history of hypertension. 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 Admitted 01/08/2015 after witnessed out of hospital cardiac arrest. He was playing soccer when he suddenly collapsed. Bystander CPR was performed. EMS arrival patient was V. fib. He received epinephrine and amiodarone. He was intubated in the field. EKG showed global ST elevations. Underwent emergent cardiac catheterization with stenting and placed on Brilinta. Echocardiogram with ejection fraction of 60% and mild posterior wall hypokinesis. 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 Cranial CT scan negative. Hospital course respiratory failure as well as acute renal failure creatinine 2.98 with follow-up critical care medicine. Patient self extubated 01/12/2015. Developed persistent fevers suspect sepsis maintained on broad-spectrum antibodies with respiratory cultures 01/11/2015 showing MSSA pneumonia and course of Ancef has been completed. Blood cultures have been negative.. Right upper quadrant ultrasound showed biliary sludge no obstruction. Subcutaneous Lovenox for DVT prophylaxis. Dysphagia #2 nectar thick liquid diet and later advanced to a regular consistency diet. 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 Renal function has improved creatinine 1.07. Physical therapy evaluation completed with  recommendations of physical medicine rehabilitation consult Patient transferred to CIR on 01/24/2015 .   Patient currently requires mod with mobility secondary to muscle weakness, decreased cardiorespiratoy endurance, impaired timing and sequencing, decreased coordination and decreased motor planning, decreased visual acuity, decreased problem solving and delayed processing and decreased sitting balance, decreased standing balance and decreased postural control.  Prior to hospitalization, patient was independent  with mobility and lived with   in a House home.  Home access is 3Stairs to enter.  Patient will benefit from skilled PT intervention to maximize safe functional mobility, minimize fall risk and decrease caregiver burden for planned discharge home with 24 hour assist.  Anticipate patient will benefit from follow up [facility] at discharge.     Skilled Therapeutic Intervention PT Evaluation: Pt presents s/p cardiac arrest with impaired cognition, impaired balance and safety with functional mobility, and very low activity tolerance. Pt will benefit from IPR PT to maximize safe functional mobility.   Gait Training: PT instructs pt in ambulation with RW x 75' req min-mod A for balance: short step length B, excessive lateral trunk lean unable to correct with cues, wide BOS.   Therapeutic Activity: PT instructs pt in sit to/from stand req min A, sit to supine req SBA, supine to sit req CGA for safety due to impaired dynamic sit balance once in sit, and min A stand-step transfer with HHA w/c to/from bed.   W/C Management: PT instructs pt in w/c propulsion with B UEs req intermittent min A and verbal cues for steering. Pt req assist for w/c parts management.   Treatment Session 2: Therapeutic Activity: Pt received in bed with bed alarm on - completes supine to sit with encouragement and SBA, then bed to w/c stand-step transfer with min A for balance.  Five times Sit to Stand Test (FTSS) Method: Use  a straight back chair with a solid seat that is 16-18" high. Ask participant to sit on the chair with arms folded across their chest.   Instructions: "Stand up and sit down as quickly as possible 5 times, keeping your arms folded across your chest."   Measurement: Stop timing when the participant stands the 5th time.  TIME: 15______ (in seconds)  Times > 13.6 seconds is associated with increased disability and morbidity (Guralnik, 2000) Times > 15 seconds is predictive of recurrent falls in healthy individuals aged 53 and older (Buatois, et al., 2008) Normal performance values in community dwelling individuals aged 93 and older (Bohannon, 2006): o 60-69 years: 11.4 seconds o 70-79 years: 12.6 seconds o 80-89 years: 14.8 seconds  MCID: ? 2.3 seconds for Vestibular Disorders (Meretta, 2006)  At end of session, pt req to have a BM, so PT instructs pt in w/c to toilet transfer with grab bar req min A for balance, while pt self manages pants. Pt passed off to RN once on toilet.    Gait Training: PT instructs pt in ascending 3" high steps: 1 step leading with each leg and B rails req min A, 2 steps leading with each leg and Brails req min A. PT progresses this to 6" height stairs in the same manner.    Pt gets fatigued very rapidly, req multiple rest breaks. Continue per PT POC.     PT Evaluation Precautions/Restrictions Precautions Precautions: Fall Restrictions Weight Bearing Restrictions: No General Chart Reviewed: Yes Family/Caregiver Present: Yes (wife and daughter) Vital SignsTherapy Vitals  Pulse Rate: 80 BP: 109/66 mmHg Patient Position (if appropriate): Sitting Oxygen Therapy SpO2: 100 % O2 Device: Not Delivered Pain Pain Assessment Pain Assessment: No/denies pain Pain Score: 7  Pain Type: Acute pain Pain Location: Toe (Comment which one) (3rd, 4th, and 5th toes) Pain Orientation: Left Pain Descriptors / Indicators: Sharp Pain Onset: On-going Pain  Intervention(s): Rest;Other (Comment) (shoes donned to protect toes from trauma) Multiple Pain Sites: No  Treatment Session 2: Pt c/o 10/10 pain in L toes - PT uses rest and distraction and notifies RN who brings pain meds.  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 Level of Independence: Independent with transfers;Independent with gait  Able to Take Stairs?: Yes Driving: Yes Vocation: Other (comment) Vocation Requirements: works when he is on a job Vision/Perception     Cognition Overall Cognitive Status: Impaired/Different from baseline Arousal/Alertness: Awake/alert Orientation Level: Oriented X4 Attention: Focused Focused Attention: Appears intact Memory: Impaired Memory Impairment: Decreased recall of new information Problem Solving: Impaired Problem Solving Impairment: Functional basic Sensation Sensation Light Touch: Appears Intact Stereognosis: Not tested Hot/Cold: Not tested Proprioception: Appears Intact Coordination Gross Motor Movements are Fluid and Coordinated: Yes Fine Motor Movements are Fluid and Coordinated: No (difficulty placing socks on feet) Motor  Motor Motor: Abnormal postural alignment and control Motor - Skilled Clinical Observations: general slouched sitting posture - likely premorbid  Mobility Bed Mobility Bed Mobility: Rolling Right;Rolling Left;Sit to Supine;Right Sidelying to Sit Rolling Right: 5: Supervision Rolling Right Details: Verbal cues for precautions/safety Rolling Left: 5: Supervision Rolling Left Details: Verbal cues for precautions/safety Right Sidelying to Sit: 4: Min guard (for safety) Sit to Supine: 5: Supervision Sit to Supine - Details: Verbal  cues for precautions/safety Transfers Transfers: Yes Sit to Stand: 4: Min assist Sit to Stand Details: Manual facilitation for placement (balance) Stand to Sit: 4: Min guard Stand Pivot Transfers: 4: Min assist Stand Pivot Transfer Details: Manual facilitation for placement (balance) Locomotion  Ambulation Ambulation: Yes Ambulation/Gait Assistance: 3: Mod assist Ambulation Distance (Feet): 75 Feet Assistive device: 1 person hand held assist Ambulation/Gait Assistance Details: Manual facilitation for weight shifting Ambulation/Gait Assistance Details: excessive lateral trunk lean Gait Gait: Yes Gait Pattern: Impaired Gait Pattern: Decreased step length - left;Decreased step length - right;Decreased dorsiflexion - left;Decreased dorsiflexion - right;Lateral trunk lean to right;Lateral trunk lean to left;Wide base of support Gait velocity: slowed Wheelchair Mobility Wheelchair Mobility: Yes Wheelchair Assistance: 4: Advertising account executive Details: Financial planner: Both upper extremities Wheelchair Parts Management: Needs assistance Distance: 75  Trunk/Postural Assessment  Cervical Assessment Cervical Assessment: Within Functional Limits Thoracic Assessment Thoracic Assessment: Within Functional Limits Lumbar Assessment Lumbar Assessment: Within Functional Limits Postural Control Postural Control: Deficits on evaluation Postural Limitations: generalized slouched posture  Balance Balance Balance Assessed: Yes Static Sitting Balance Static Sitting - Balance Support: Feet supported;Bilateral upper extremity supported Static Sitting - Level of Assistance: 5: Stand by assistance Dynamic Sitting Balance Dynamic Sitting - Balance Support: Feet supported;Bilateral upper extremity supported Dynamic Sitting - Level of Assistance: 4: Min assist Static Standing Balance Static Standing - Balance Support: During functional activity;No  upper extremity supported Static Standing - Level of Assistance: 4: Min assist Dynamic Standing Balance Dynamic Standing - Balance Support: During functional activity;No upper extremity supported Dynamic Standing - Level  of Assistance: 4: Min assist Extremity Assessment  RUE Assessment RUE Assessment: Exceptions to [facility] RUE AROM (degrees) Overall AROM Right Upper Extremity: Within functional limits for tasks performed RUE Strength RUE Overall Strength: Deficits RUE Overall Strength Comments: shoulder and elbows grossly 4+/5, but grip 3+/5 LUE Assessment LUE Assessment: Exceptions to WFL LUE AROM (degrees) Overall AROM Left Upper Extremity: Within functional limits for tasks assessed LUE Strength LUE Overall Strength: Deficits LUE Overall Strength Comments: shoulder and elbows grossly 4+/5, but grip 3+/5 RLE Assessment RLE Assessment: Exceptions to [facility] RLE AROM (degrees) Overall AROM Right Lower Extremity: Within functional limits for tasks assessed RLE Strength RLE Overall Strength: Deficits RLE Overall Strength Comments: hip and knee grossly 4+/5, ankle DF 4/5 LLE Assessment LLE Assessment: Exceptions to WFL LLE AROM (degrees) Overall AROM Left Lower Extremity: Within functional limits for tasks assessed LLE Strength LLE Overall Strength: Deficits LLE Overall Strength Comments: hip and knee grossly 4+/5, ankle DF 3+/5  FIM:  FIM - Locomotion: Wheelchair Distance: 75 FIM - Locomotion: Ambulation Ambulation/Gait Assistance: 3: Mod assist   Refer to Care Plan for Long Term Goals  Recommendations for other services: None  Discharge Criteria: Patient will be discharged from PT if patient refuses treatment 3 consecutive times without medical reason, if treatment goals not met, if there is a change in medical status, if patient makes no progress towards goals or if patient is discharged from hospital.  The above assessment, treatment plan, treatment alternatives and goals were  discussed and mutually agreed upon: by patient and by family  [facility] M 01/25/2015, 12:26 PM

## 2015-01-25 NOTE — Evaluation (Addendum)
Speech Language Pathology Assessment and Plan  Patient Details  Name: Alexander Berry MRN: [id number] Date of Birth: 12/24/1965  SLP Diagnosis: Cognitive Impairments;Speech and Language deficits  Rehab Potential: Excellent ELOS: 7-10 days     Today's Date: 01/25/2015 SLP Individual Time: 1000-1100 SLP Individual Time Calculation (min): 60 min   Problem List:  Patient Active Problem List   Diagnosis Date Noted  . Gangrene of toe   . Cardiogenic shock   . Essential hypertension   . Gangrene of foot   . Acute on chronic renal failure   . Hypernatremia   . Shock liver   . Anoxic encephalopathy   . Dysphagia   . Absolute anemia   . Anoxic brain injury   . AKI (acute kidney injury)   . Septic shock 01/13/2015  . ARDS (adult respiratory distress syndrome) 01/13/2015  . Thrombocytopenia 01/13/2015  . Staphylococcal pneumonia 01/13/2015  . Encounter for intubation 01/12/2015  . Pneumonia, organism unspecified   . Acute pulmonary edema   . Hyperglycemia   . Encephalopathy acute   . Cardiac arrest 01/08/2015  . Acute respiratory failure with hypoxia 01/08/2015  . Hypokalemia 01/08/2015  . Ventricular tachycardia   . Acute ST elevation myocardial infarction (STEMI) involving right coronary artery    Past Medical History:  Past Medical History  Diagnosis Date  . Hypertension   . Difficult intubation     difficult airway/FYI 01/08/2015   Past Surgical History:  Past Surgical History  Procedure Laterality Date  . Cardiac catheterization N/A 01/08/2015    Procedure: Left Heart Cath and Coronary Angiography;  Surgeon: Troy Sine, MD;  Location: [place] [facility];  Service: Cardiovascular;  Laterality: N/A;  . Cardiac catheterization  01/08/2015    Procedure: Coronary Stent Intervention;  Surgeon: Troy Sine, MD;  Location: [place] [facility];  Service: Cardiovascular;;  . Cardiac catheterization  01/08/2015    Procedure: Coronary Balloon Angioplasty;  Surgeon: Troy Sine,  MD;  Location: [place] [facility];  Service: Cardiovascular;;  . Electrophysiologic study  01/08/2015    Procedure: Cardioversion;  Surgeon: Troy Sine, MD;  Location: [place] [facility];  Service: Cardiovascular;;    Assessment / Plan / Recommendation Clinical Impression Patient is a 49 y.o. right handed male with history of hypertension. Independent prior to admission living with wife and family and working in [employer]. Admitted 01/08/2015 after witnessed out of hospital cardiac arrest. He was playing soccer when he suddenly collapsed. Bystander CPR was performed. EMS arrival patient was V. fib. He received epinephrine and amiodarone. He was intubated in the field. EKG showed global ST elevations. Underwent emergent cardiac catheterization with stenting and placed on Brilinta. Echocardiogram with ejection fraction of 60% and mild posterior wall hypokinesis. EEG was negative for seizure but noted generalized slowing indicating mild to moderate cerebral disturbance encephalopathy. Cranial CT scan negative. Hospital course complicated by respiratory failure as well as acute renal failure with creatinine 2.98 with follow-up critical care medicine. Patient self-extubated 01/12/2015. Developed persistent fevers suspect sepsis maintained on broad-spectrum antibodies with respiratory cultures 01/11/2015 showing MSSA pneumonia and course of Ancef has been completed. Blood cultures have been negative. 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 Renal function has improved creatinine 1.07. Physical therapy evaluation completed with recommendations of physical medicine rehabilitation consult. Patient was admitted to CIR on 01/24/15 and demonstrates mild cognitive impairments in the areas of attention, recall, problem solving and awareness which impacts his ability to  complete functional and mildly complex tasks safely. Patient also demonstrates a hoarse  vocal quality with decreased vocal intensity which impacts his speech intelligibility. Patient was also administered a BSE and patient's swallowing function appeared [facility] with solid textures and thin liquids. Recommend patient continue current diet. Patient would benefit from skilled SLP intervention to maximize his speech intelligibility and cognitive function in order to maximize his overall functional independence prior to discharge.   Skilled Therapeutic Interventions          Administered a cognitive-linguistic evaluation and BSE. Please see above for details.    SLP Assessment  Patient will need skilled Speech Lanaguage Pathology Services during CIR admission    Recommendations  SLP Diet Recommendations: Thin;Age appropriate regular solids Liquid Administration via: Cup;Straw Medication Administration: Whole meds with liquid Supervision: Patient able to self feed Compensations: Slow rate;Small sips/bites;Follow solids with liquid;Minimize environmental distractions Postural Changes and/or Swallow Maneuvers: Seated upright 90 degrees Oral Care Recommendations: Oral care BID Recommendations for Other Services: Neuropsych consult Patient destination: Home Follow up Recommendations: 24 hour supervision/assistance;Outpatient SLP Equipment Recommended: None recommended by SLP    SLP Frequency 3 to 5 out of 7 days   SLP Treatment/Interventions Cognitive remediation/compensation;Cueing hierarchy;Environmental controls;Functional tasks;Internal/external aids;Patient/family education;Therapeutic Activities;Speech/Language facilitation    Pain Pain Assessment Pain Assessment: No/denies pain  Short Term Goals: Week 1: SLP Short Term Goal 1 (Week 1): Patient will utilize an increased vocal itensity at the phrase level with Mod I.  SLP Short Term Goal 2 (Week 1): Patient will demonstrate functional problem solving for basic and familiar tasks with supervision verbal and question cues.  SLP Short  Term Goal 3 (Week 1): Patient will demonstrate selective attention in a mildly distracting enviornment for 60 minutes with supervision verbal cues.  SLP Short Term Goal 4 (Week 1): Patient will utilize external memory aids to recall new, daily information with supervision verbal cues.  SLP Short Term Goal 5 (Week 1): Patient will demonstrate anticipatory awareness in regards to d/c planning and identiyfing barriers/activities that will require assistance at discharge with supervision verbal questions.   See FIM for current functional status Refer to Care Plan for Long Term Goals  Recommendations for other services: Neuropsych  Discharge Criteria: Patient will be discharged from SLP if patient refuses treatment 3 consecutive times without medical reason, if treatment goals not met, if there is a change in medical status, if patient makes no progress towards goals or if patient is discharged from hospital.  The above assessment, treatment plan, treatment alternatives and goals were discussed and mutually agreed upon: by patient and by family  Alexander Berry 01/25/2015, 2:13 PM

## 2015-01-25 NOTE — Progress Notes (Signed)
Patient information reviewed and entered into eRehab system by Avangeline Stockburger, RN, CRRN, PPS Coordinator.  Information including medical coding and functional independence measure will be reviewed and updated through discharge.    

## 2015-01-25 NOTE — Progress Notes (Signed)
[place] PHYSICAL MEDICINE & REHABILITATION     PROGRESS NOTE    Subjective/Complaints: Had a good night. Denies pain except for in his toes. Rested well. Ready to get up with therapy today.   Objective: Vital Signs: Blood pressure 132/74, pulse 82, temperature 98.1 F (36.7 C), temperature source Oral, resp. rate 17, SpO2 97 %. No results found.  Recent Labs  01/23/15 0250 01/24/15 1951  WBC 11.3* 7.6  HGB 10.0* 8.9*  HCT 33.0* 27.8*  PLT 749* 741*    Recent Labs  01/23/15 0250 01/24/15 0430 01/24/15 1951  NA 149* 139  --   K 4.0 3.9  --   CL 119* 112*  --   GLUCOSE 111* 106*  --   BUN 45* 32*  --   CREATININE 1.07 0.96 1.05  CALCIUM 8.9 8.1*  --    CBG (last 3)   Recent Labs  01/23/15 0351 01/23/15 0749 01/23/15 1132  GLUCAP 102* 99 107*    Wt Readings from Last 3 Encounters:  01/24/15 85.594 kg (188 lb 11.2 oz)    Physical Exam:   Constitutional: He appears well-developed. No distress HENT: dentition fair Head: Normocephalic.  Eyes: EOM are normal.  Neck: Normal range of motion. Neck supple. No thyromegaly present.  Cardiovascular: Normal rate and regular rhythm.  Respiratory: Effort normal and breath sounds normal. No respiratory distress.  GI: Soft. Bowel sounds are normal. He exhibits no distension.  Neurological: He is alert.  Limited English. He was able to follow simple commands and answer simple yes no questions. UE 4/5 prox to distal. LE: 3-/5 hf, 3/5ke, 4/5 ankles, some pain limitations  Skin: Lateral three toes on left foot are starting to demarcate. He has pedal pulses with tips of toes significant ischemic changes, especially lateral 2. Toes remain tender to touch. Some bruising over the knees/shins Psychiatric: He has a normal mood and affect. His behavior is normal  Assessment/Plan: 1. 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 Physiatrist is providing close team supervision and 24 hour management of active medical problems listed below. Physiatrist and rehab team continue to assess barriers to discharge/monitor patient progress toward functional and medical goals. FIM:       FIM - Toileting Toileting steps completed by patient: Adjust clothing prior to toileting, Performs perineal hygiene, Adjust clothing after toileting Toileting Assistive Devices: Grab bar or rail for support Toileting: 5: Supervision: Safety issues/verbal cues           Comprehension Comprehension Mode: Auditory              Medical Problem List and Plan: 1. Functional deficits secondary to debilitation after cardiac arrest status post stenting/potential anoxic encephalopathy 2. DVT Prophylaxis/Anticoagulation: Subcutaneous Lovenox. Monitor platelet counts of any signs of bleeding 3. Pain Management: Tylenol as needed 4. Acute renal insufficiency. Creatinine rebounding to 1.05. Follow-up chemistries 5. Neuropsych: This patient is capable of making decisions on his own behalf. 6. Skin/Wound Care: Routine skin checks 7. Fluids/Electrolytes/Nutrition: Strict I&O with follow-up chemistries 8. Gangrenous ischemic toes. Continued conservative care for now. Will need surgical intervention at some point. 9. MSSA pneumonia. Patient has completed course of Ancef 10. Hypertension. Norvasc 5 mg daily, Coreg 18.75 mg twice a day, lisinopril 10 mg daily, Aldactone 12.5 mg daily. Monitor with increased mobility 11. Anemia: likely due to disease. Follow up next hgb to determine if further assessment needs to  be done  LOS (Days) 1 A FACE TO FACE EVALUATION WAS PERFORMED  Shaun Zuccaro T 01/25/2015 8:24 AM

## 2015-01-25 NOTE — Progress Notes (Signed)
Ranelle Oyster, MD Physician Signed Physical Medicine and Rehabilitation Consult Note 01/23/2015 5:07 AM  Related encounter: ED to Hosp-Admission (Discharged) from 01/08/2015 in [facility]    Expand All Collapse All        Physical Medicine and Rehabilitation Consult Reason for Consult: Deconditioning/STEMI/cardiogenic shock Referring Physician: [facility]   HPI: Alexander Berry is a 49 y.o. right handed male with history of hypertension. Admitted 01/08/2015 after witnessed out of hospital cardiac arrest. He was playing soccer when he suddenly collapsed. Bystander CPR was performed. EMS arrival patient was V. fib. He received epinephrine and amiodarone. He was intubated in the field. EKG showed global ST elevations. Underwent emergent cardiac catheterization with stenting. Echocardiogram with ejection fraction of 60% and mild posterior wall hypokinesis. 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 Cranial CT scan negative. Hospital course respiratory failure as well as acute renal failure creatinine 2.98 with follow-up critical care medicine. Patient self extubated 01/12/2015. Developed persistent fevers suspect sepsis maintained on broad-spectrum antibodies. Right upper quadrant ultrasound showed biliary sludge no obstruction. Subcutaneous Lovenox for DVT prophylaxis. Dysphagia #2 nectar thick liquid diet. Vascular surgery consulted for ischemic toes with plan ABIs and workup ongoing. Renal function has improved creatinine 1.07. Physical therapy evaluation completed with recommendations of physical medicine rehabilitation consult.   Review of Systems  Constitutional: Positive for fever. Negative for malaise/fatigue.  Eyes: Negative for blurred vision and double vision.  Respiratory: Negative for cough and shortness of breath.  Cardiovascular: Negative for palpitations and leg swelling.    Gastrointestinal: Positive for constipation.  Genitourinary: Negative for dysuria and hematuria.  Musculoskeletal: Negative for myalgias and joint pain.  Skin: Negative for rash.  Neurological: Negative for dizziness and headaches.   Past Medical History  Diagnosis Date  . Hypertension    Past Surgical History  Procedure Laterality Date  . Cardiac catheterization N/A 01/08/2015    Procedure: Left Heart Cath and Coronary Angiography; Surgeon: Lennette Bihari, MD; Location: [facility] INVASIVE CV LAB; Service: Cardiovascular; Laterality: N/A;  . Cardiac catheterization  01/08/2015    Procedure: Coronary Stent Intervention; Surgeon: Lennette Bihari, MD; Location: [facility] INVASIVE CV LAB; Service: Cardiovascular;;  . Cardiac catheterization  01/08/2015    Procedure: Coronary Balloon Angioplasty; Surgeon: Lennette Bihari, MD; Location: [facility] INVASIVE CV LAB; Service: Cardiovascular;;  . Electrophysiologic study  01/08/2015    Procedure: Cardioversion; Surgeon: Lennette Bihari, MD; Location: [facility] INVASIVE CV LAB; Service: Cardiovascular;;   Family History  Problem Relation Age of Onset  . Hypertension Father    Social History:  has no tobacco, alcohol, and drug history on file. 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 History: Prior Function Level of Independence: Independent Functional Status:  Mobility: Bed Mobility Overal bed mobility: Needs Assistance Bed Mobility: Rolling, Sidelying to Sit Rolling: Mod assist Sidelying to sit: Max  assist General bed mobility comments: not assessed, pt in chair, returned to chair Transfers Overall transfer level: Needs assistance Transfers: Sit to/from Stand, Squat Pivot Transfers Sit to Stand: +2 physical assistance, Max assist Squat pivot transfers: Max assist, +2 physical assistance General transfer  comment: stood from recliner x 1, assisted with pad and gait belt with knees blocked      ADL: ADL Overall ADL's : Needs assistance/impaired Eating/Feeding: NPO Grooming: Wash/dry hands, Wash/dry face, Oral care, Moderate assistance, Sitting Upper Body Bathing: Maximal assistance, Sitting Lower Body Bathing: Total assistance, Bed level Upper Body Dressing : Moderate assistance, Sitting Lower Body Dressing: Total assistance, +2 for physical assistance, Bed level Toilet Transfer: +2 for safety/equipment, Maximal assistance, Squat-pivot Toileting- Clothing Manipulation and Hygiene: +2 for physical assistance, Total assistance  Cognition: Cognition Overall Cognitive Status: Impaired/Different from baseline Orientation Level: Oriented to person, Oriented to place, Oriented to time, Disoriented to situation Cognition Arousal/Alertness: Awake/alert Behavior During Therapy: Impulsive Overall Cognitive Status: Impaired/Different from baseline Area of Impairment: Orientation, Following commands, Safety/judgement, Problem solving Orientation Level: Disoriented to, Time, Situation, Place Memory: Decreased short-term memory Following Commands: Follows one step commands with increased time, Follows one step commands inconsistently Safety/Judgement: Decreased awareness of safety, Decreased awareness of deficits Problem Solving: Slow processing, Requires tactile cues, Requires verbal cues General Comments: Pt thought he was in [employer]. Attempting to stand stating he wanted to walk.  Blood pressure 142/79, pulse 74, temperature 98.8 F (37.1 C), temperature source Oral, resp. rate 19,  height 5\' 7"  (1.702 m), weight 93.21 kg (205 lb 7.9 oz), SpO2 100 %. Physical Exam  Constitutional: He appears well-developed.  HENT:  Head: Normocephalic.  Eyes: EOM are normal.  Neck: Normal range of motion. Neck supple. No thyromegaly present.  Cardiovascular: Normal rate and regular rhythm.  Respiratory: Effort normal and breath sounds normal. No respiratory distress.  GI: Soft. Bowel sounds are normal. He exhibits no distension.  Neurological: He is alert.  Limited English speaking. He was able to follow simple commands and answer simple yes no questions. Moves all 4's with proximal greater than distal weakness noted.  Skin: Skin is warm and dry.  Psychiatric: He has a normal mood and affect. His behavior is normal.     Lab Results Last 24 Hours    Results for orders placed or performed during the hospital encounter of 01/08/15 (from the past 24 hour(s))  Glucose, capillary Status: Abnormal   Collection Time: 01/22/15 7:35 AM  Result Value Ref Range   Glucose-Capillary 100 (H) 65 - 99 mg/dL   Comment 1 Capillary Specimen   Glucose, capillary Status: Abnormal   Collection Time: 01/22/15 11:18 AM  Result Value Ref Range   Glucose-Capillary 123 (H) 65 - 99 mg/dL   Comment 1 Capillary Specimen   Basic metabolic panel Status: Abnormal   Collection Time: 01/22/15 12:30 PM  Result Value Ref Range   Sodium 148 (H) 135 - 145 mmol/L   Potassium 3.7 3.5 - 5.1 mmol/L   Chloride 118 (H) 101 - 111 mmol/L   CO2 21 (L) 22 - 32 mmol/L   Glucose, Bld 121 (H) 65 - 99 mg/dL   BUN 41 (H) 6 - 20 mg/dL   Creatinine, Ser 1.61 0.61 - 1.24 mg/dL   Calcium 8.5 (L) 8.9 - 10.3 mg/dL   GFR calc non Af Amer >60 >60 mL/min   GFR calc Af Amer >60 >60 mL/min   Anion gap 9 5 - 15  Glucose, capillary Status: Abnormal   Collection Time: 01/22/15 4:21 PM  Result Value Ref Range    Glucose-Capillary 102 (H) 65 - 99 mg/dL   Comment 1 Capillary Specimen   Glucose, capillary Status: Abnormal   Collection Time: 01/22/15 8:13 PM  Result Value Ref Range  Glucose-Capillary 132 (H) 65 - 99 mg/dL  Glucose, capillary Status: Abnormal   Collection Time: 01/23/15 12:14 AM  Result Value Ref Range   Glucose-Capillary 113 (H) 65 - 99 mg/dL  Comprehensive metabolic panel Status: Abnormal   Collection Time: 01/23/15 2:50 AM  Result Value Ref Range   Sodium 149 (H) 135 - 145 mmol/L   Potassium 4.0 3.5 - 5.1 mmol/L   Chloride 119 (H) 101 - 111 mmol/L   CO2 20 (L) 22 - 32 mmol/L   Glucose, Bld 111 (H) 65 - 99 mg/dL   BUN 45 (H) 6 - 20 mg/dL   Creatinine, Ser 6.62 0.61 - 1.24 mg/dL   Calcium 8.9 8.9 - 94.7 mg/dL   Total Protein 6.6 6.5 - 8.1 g/dL   Albumin 2.5 (L) 3.5 - 5.0 g/dL   AST 67 (H) 15 - 41 U/L   ALT 80 (H) 17 - 63 U/L   Alkaline Phosphatase 112 38 - 126 U/L   Total Bilirubin 1.6 (H) 0.3 - 1.2 mg/dL   GFR calc non Af Amer >60 >60 mL/min   GFR calc Af Amer >60 >60 mL/min   Anion gap 10 5 - 15  Magnesium Status: None   Collection Time: 01/23/15 2:50 AM  Result Value Ref Range   Magnesium 2.4 1.7 - 2.4 mg/dL  CBC with Differential/Platelet Status: Abnormal   Collection Time: 01/23/15 2:50 AM  Result Value Ref Range   WBC 11.3 (H) 4.0 - 10.5 K/uL   RBC 3.60 (L) 4.22 - 5.81 MIL/uL   Hemoglobin 10.0 (L) 13.0 - 17.0 g/dL   HCT 65.4 (L) 65.0 - 35.4 %   MCV 91.7 78.0 - 100.0 fL   MCH 27.8 26.0 - 34.0 pg   MCHC 30.3 30.0 - 36.0 g/dL   RDW 65.6 (H) 81.2 - 75.1 %   Platelets 749 (H) 150 - 400 K/uL   Neutrophils Relative % 82 (H) 43 - 77 %   Neutro Abs 9.2 (H) 1.7 - 7.7 K/uL   Lymphocytes Relative 9 (L) 12 - 46 %   Lymphs Abs 1.0 0.7 - 4.0 K/uL   Monocytes Relative 7 3 - 12  %   Monocytes Absolute 0.8 0.1 - 1.0 K/uL   Eosinophils Relative 2 0 - 5 %   Eosinophils Absolute 0.2 0.0 - 0.7 K/uL   Basophils Relative 0 0 - 1 %   Basophils Absolute 0.0 0.0 - 0.1 K/uL  Glucose, capillary Status: Abnormal   Collection Time: 01/23/15 3:51 AM  Result Value Ref Range   Glucose-Capillary 102 (H) 65 - 99 mg/dL   Comment 1 Capillary Specimen       Imaging Results (Last 48 hours)    Dg Swallowing Func-speech Pathology  01/21/2015 Objective Swallowing Evaluation: Patient Details Name: Alexander Berry MRN: [id number] Date of Birth: 1966-01-18 Today's Date: 01/21/2015 Time: SLP Start Time (ACUTE ONLY): 1119-SLP Stop Time (ACUTE ONLY): 1136 SLP Time Calculation (min) (ACUTE ONLY): 17 min Past Medical History: Past Medical History Diagnosis Date . Hypertension Past Surgical History: Past Surgical History Procedure Laterality Date . Cardiac catheterization N/A 01/08/2015 Procedure: Left Heart Cath and Coronary Angiography; Surgeon: Lennette Bihari, MD; Location: [facility] INVASIVE CV LAB; Service: Cardiovascular; Laterality: N/A; . Cardiac catheterization 01/08/2015 Procedure: Coronary Stent Intervention; Surgeon: Lennette Bihari, MD; Location: [facility] INVASIVE CV LAB; Service: Cardiovascular;; . Cardiac catheterization 01/08/2015 Procedure: Coronary Balloon Angioplasty; Surgeon: Lennette Bihari, MD; Location: [facility] INVASIVE CV LAB; Service: Cardiovascular;; . Electrophysiologic study 01/08/2015 Procedure: Cardioversion; Surgeon:  Lennette Bihari, MD; Location: [facility] INVASIVE CV LAB; Service: Cardiovascular;; HPI: Other Pertinent Information: 49 y/o male with no prior cardiac history and HTN admitted after cardiac arrest while playing soccer. Intubated 6/5, self extubated 6/9, re-intubated 6/9-6/16, stent placement, ARDS. CXDR Persistent unchanged diffuse bilateral pulmonary infiltrates. No Data Recorded Assessment / Plan /  Recommendation CHL IP CLINICAL IMPRESSIONS 01/21/2015 Therapy Diagnosis Mild oral phase dysphagia;Mild pharyngeal phase dysphagia Clinical Impression Pt presenting with acute reversible oral pharyngeal dysphagia 2 days post 12 day intubation period. Pt appears slightly lethargic with mild weakness. Pt with delayed swallow with all consistencies triggering at the level of the valleculae. Silent aspiration seen x1 following thin liquid by cup. Cueing for volitional cough did not clear the airway of the aspirates. Additional thin liquid trials noted flash penetration. Chin tuck maneuver instructed with the use of the patients son for translation, however pt unable to follow commands for adequate implementation. Pt with better airway protection of nectar thick liquids with no penetration or aspiraiton evidenced throughout the study. Fatigue seen with regular textures including prolonged mastication and piece meal deglutiton. Recommend dysphagia 2 diet with nectar thick liquids. Medicines whole with puree. Anticipate as mentation and strenght improve pt will safely tolerate upgraded diet trials. ST follow up warranted. CHL IP TREATMENT RECOMMENDATION 01/21/2015 Treatment Recommendations Therapy as outlined in treatment plan below CHL IP DIET RECOMMENDATION 01/21/2015 SLP Diet Recommendations Nectar;Dysphagia 2 (Fine chop) Liquid Administration via (None) Medication Administration Whole meds with puree Compensations Slow rate;Small sips/bites;Follow solids with liquid;Minimize environmental distractions Postural Changes and/or Swallow Maneuvers (None) CHL IP OTHER RECOMMENDATIONS 01/21/2015 Recommended Consults (None) Oral Care Recommendations Oral care BID Other Recommendations Order thickener from pharmacy No flowsheet data found. CHL IP FREQUENCY AND DURATION 01/21/2015 Speech Therapy Frequency (ACUTE ONLY) min 2x/week Treatment Duration 2 weeks Pertinent  Vitals/Pain SLP Swallow Goals No flowsheet data found. No flowsheet data found. CHL IP REASON FOR REFERRAL 01/21/2015 Reason for Referral Objectively evaluate swallowing function Marcene Duos MA, CCC-SLP Acute Care Speech Language Pathologist Kennieth Rad 01/21/2015, 12:04 PM     Assessment/Plan: Diagnosis: debility after cardiac arrest, potential anoxic encephalopathy 1. Does the need for close, 24 hr/day medical supervision in concert with the patient's rehab needs make it unreasonable for this patient to be served in a less intensive setting? Yes 2. Co-Morbidities requiring supervision/potential complications: AKI,  3. Due to bladder management, bowel management, safety, skin/wound care, disease management, medication administration, pain management and patient education, does the patient require 24 hr/day rehab nursing? Yes 4. Does the patient require coordinated care of a physician, rehab nurse, PT (1-2 hrs/day, 5 days/week), OT (1-2 hrs/day, 5 days/week) and SLP (1-2 hrs/day, 5 days/week) to address physical and functional deficits in the context of the above medical diagnosis(es)? Yes Addressing deficits in the following areas: balance, endurance, locomotion, strength, transferring, bowel/bladder control, bathing, dressing, feeding, grooming, toileting, cognition and psychosocial support 5. Can the patient actively participate in an intensive therapy program of at least 3 hrs of therapy per day at least 5 days per week? Yes 6. The potential for patient to make measurable gains while on inpatient rehab is excellent 7. Anticipated functional outcomes upon discharge from inpatient rehab are supervision with PT, supervision with OT, modified independent and supervision with SLP. 8. Estimated rehab length of stay to reach the above functional goals is: 15-18 days 9. Does the patient have adequate social supports and living environment to accommodate these  discharge functional goals? Yes 10. Anticipated D/C setting:  Home 11. Anticipated post D/C treatments: HH therapy and Outpatient therapy 12. Overall Rehab/Functional Prognosis: good  RECOMMENDATIONS: This patient's condition is appropriate for continued rehabilitative care in the following setting: CIR Patient has agreed to participate in recommended program. Potentially Note that insurance prior authorization may be required for reimbursement for recommended care.  Comment: Rehab Admissions Coordinator to follow up.  Thanks,  Ranelle Oyster, MD, [place] [facility]     01/23/2015

## 2015-01-25 NOTE — Evaluation (Signed)
Occupational Therapy Assessment and Plan  Patient Details  Name: Alexander Berry MRN: [id number] Date of Birth: 1966-07-07  OT Diagnosis: abnormal posture, acute pain, cognitive deficits and muscle weakness (generalized) Rehab Potential: Rehab Potential (ACUTE ONLY): Excellent ELOS: 7-10 days   Today's Date: 01/25/2015 OT Individual Time:  1100- 1200     OT Total Time: 60 min  Problem List:  Patient Active Problem List   Diagnosis Date Noted  . Gangrene of toe   . Cardiogenic shock   . Essential hypertension   . Gangrene of foot   . Acute on chronic renal failure   . Hypernatremia   . Shock liver   . Anoxic encephalopathy   . Dysphagia   . Absolute anemia   . Anoxic brain injury   . AKI (acute kidney injury)   . Septic shock 01/13/2015  . ARDS (adult respiratory distress syndrome) 01/13/2015  . Thrombocytopenia 01/13/2015  . Staphylococcal pneumonia 01/13/2015  . Encounter for intubation 01/12/2015  . Pneumonia, organism unspecified   . Acute pulmonary edema   . Hyperglycemia   . Encephalopathy acute   . Cardiac arrest 01/08/2015  . Acute respiratory failure with hypoxia 01/08/2015  . Hypokalemia 01/08/2015  . Ventricular tachycardia   . Acute ST elevation myocardial infarction (STEMI) involving right coronary artery     Past Medical History:  Past Medical History  Diagnosis Date  . Hypertension   . Difficult intubation     difficult airway/FYI 01/08/2015   Past Surgical History:  Past Surgical History  Procedure Laterality Date  . Cardiac catheterization N/A 01/08/2015    Procedure: Left Heart Cath and Coronary Angiography;  Surgeon: Troy Sine, MD;  Location: [facility];  Service: Cardiovascular;  Laterality: N/A;  . Cardiac catheterization  01/08/2015    Procedure: Coronary Stent Intervention;  Surgeon: Troy Sine, MD;  Location: [facility];  Service: Cardiovascular;;  . Cardiac catheterization  01/08/2015    Procedure: Coronary Balloon  Angioplasty;  Surgeon: Troy Sine, MD;  Location: [place] [facility];  Service: Cardiovascular;;  . Electrophysiologic study  01/08/2015    Procedure: Cardioversion;  Surgeon: Troy Sine, MD;  Location: [facility];  Service: Cardiovascular;;    Assessment & Plan Clinical Impression: Alexander Berry is a 49 y.o. right handed male with history of hypertension. 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 Admitted 01/08/2015 after witnessed out of hospital cardiac arrest. He was playing soccer when he suddenly collapsed. Bystander CPR was performed. EMS arrival patient was V. fib. He received epinephrine and amiodarone. He was intubated in the field. EKG showed global ST elevations. Underwent emergent cardiac catheterization with stenting and placed on Brilinta. Echocardiogram with ejection fraction of 60% and mild posterior wall hypokinesis. 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 Cranial CT scan negative. Hospital course respiratory failure as well as acute renal failure creatinine 2.98 with follow-up critical care medicine. Patient self extubated 01/12/2015. Developed persistent fevers suspect sepsis maintained on broad-spectrum antibodies with respiratory cultures 01/11/2015 showing MSSA pneumonia and course of Ancef has been completed. Blood cultures have been negative.. Right upper quadrant ultrasound showed biliary sludge no obstruction. Subcutaneous Lovenox for DVT prophylaxis. Dysphagia #2 nectar thick liquid diet and later advanced to a regular consistency diet. 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 Renal function has improved creatinine 1.07. Physical therapy evaluation completed with recommendations  of physical medicine rehabilitation consult. Patient transferred to CIR on 01/24/2015 .    Patient currently  requires mod with basic self-care skills secondary to muscle weakness, decreased cardiorespiratoy endurance, decreased attention and delayed processing and decreased standing balance, decreased postural control, decreased balance strategies and difficulty maintaining precautions.  Prior to hospitalization, patient could complete ADLs/IADLs with independent .  Patient will benefit from skilled intervention to decrease level of assist with basic self-care skills, increase independence with basic self-care skills and increase level of independence with iADL prior to discharge home with care partner.  Anticipate patient will require intermittent supervision and follow up home health.  OT - End of Session Activity Tolerance: Tolerates 30+ min activity with multiple rests Endurance Deficit: Yes Endurance Deficit Description: Requires multiple rest breaks during dressing task OT Assessment Rehab Potential (ACUTE ONLY): Excellent OT Patient demonstrates impairments in the following area(s): Balance;Cognition;Endurance;Safety;Vision OT Basic ADL's Functional Problem(s): Grooming;Bathing;Dressing;Toileting OT Transfers Functional Problem(s): Toilet;Tub/Shower OT Additional Impairment(s): None OT Plan OT Intensity: Minimum of 1-2 x/day, 45 to 90 minutes OT Frequency: 5 out of 7 days OT Duration/Estimated Length of Stay: 7-10 days OT Treatment/Interventions: Balance/vestibular training;Cognitive remediation/compensation;Discharge planning;Community reintegration;DME/adaptive equipment instruction;Functional mobility training;Patient/family education;Self Care/advanced ADL retraining;Therapeutic Activities;Therapeutic Exercise;UE/LE Strength taining/ROM OT Self Feeding Anticipated Outcome(s): Independent OT Basic Self-Care Anticipated Outcome(s): Mod I OT Toileting Anticipated Outcome(s): Mod I OT Bathroom Transfers Anticipated Outcome(s): Supervision- mod I OT Recommendation Patient destination:  Home Follow Up Recommendations: None Equipment Recommended: To be determined   Skilled Therapeutic Intervention Pt seen for OT eval and ADL bathing and dressing session. Pt sitting in w/c upon arrival, wife, daughter and interpreter present. Pt ambulated within room with mod steadying assist. Pt with decreased awareness into deficits and need for assist. Pt ambulated into bathroom and completed shower transfer with mod A. Pt bathed with overall close supervision and steadying assist when standing to complete pericare/ buttock hygiene. Pt dressed seated on toilet, requiring assist with dressing L LE due to pain in 3-5 toes. Steadying assist provided when standing to pull up pants/ underwear. Pt completed grooming task standing at sink with steadying assist, tolerating ~1-2 minutes of standing tolerance. Pt returned to w/c with mod A and left sitting in w/c with all needs in reach. Pt verbalized desire to remove L shoe/sock to let it "air out". Sock and shoes doffed with total A, educated regarding need to cover toe/foot when ambulating for safety.  Pt required VCs throughout for safety. He demonstrates mild cognitive impairment with decreased ability to follow basic verbal directions or complete tasks with set-up cues only. Pt requires rest breaks throughout session, and occasional VCs to initiate rest breaks and slow down.   Education provided regarding role of OT, POC, OT goals, deep breathing techniques, and d/c planning.  OT Evaluation Precautions/Restrictions  Precautions Precautions: Fall Precaution Comments: gangrenous left  toes (especially 3rd through 5th); monitor HR and O2; Requires frequent rest breaks during functional tasks Restrictions Weight Bearing Restrictions: No General Chart Reviewed: Yes Pain Pain Assessment Pain Assessment: 0-10 Pain Score: No/denies pain   Home Living/Prior Functioning Home Living Available Help at Discharge: Family, Available 24 hours/day Type of  Home: House Home Access: Stairs to enter [employer] of Steps: 3 Entrance Stairs-Rails: None Home Layout: One level Additional Comments: pt works in IT consultant Level of Independence: Independent with transfers, Independent with gait, Independent with homemaking with ambulation, Independent with basic ADLs  Able to Take Stairs?: Yes Driving: Yes Vocation: Other (comment) Vocation Requirements:  works when he is on a job Vision/Perception  Vision- History Baseline Vision/History: No visual deficits Patient Visual Report: Blurring of vision Vision- Assessment Vision Assessment?: Vision impaired- to be further tested in functional context Additional Comments: Pt reports decreased visual accuity with items close up, however, did not display any visual deficits during ADL bathing and dressing session.   Cognition Overall Cognitive Status: Impaired/Different from baseline Arousal/Alertness: Awake/alert Orientation Level: Person;Place;Situation Person: Oriented Place: Oriented Situation: Oriented Year: 2016 Month: June Day of Week: Correct Memory: Impaired Memory Impairment: Decreased recall of new information Immediate Memory Recall: Sock;Blue;Bed Memory Recall: Sock;Blue;Bed Memory Recall Sock: Without Cue Memory Recall Blue: With Cue Memory Recall Bed: Without Cue Attention: Focused;Sustained Focused Attention: Appears intact Sustained Attention: Appears intact Awareness: Impaired Awareness Impairment: Emergent impairment Problem Solving: Impaired Problem Solving Impairment: Functional basic Behaviors: Impulsive Safety/Judgment: Impaired Comments: Decreased awareness of need for assist and decreased awareness of deficits Sensation Sensation Light Touch: Appears Intact Proprioception: Appears Intact Coordination Gross Motor Movements are Fluid and Coordinated: Yes Fine Motor Movements are Fluid and Coordinated: Yes Motor  Motor Motor:  Abnormal postural alignment and control Motor - Skilled Clinical Observations: general slouched sitting posture - likely premorbid Mobility  Transfers Transfers: Sit to Stand;Stand to Sit Sit to Stand: 4: Min assist Sit to Stand Details: Manual facilitation for placement Sit to Stand Details (indicate cue type and reason): Assist for positioning and controled descent Stand to Sit: 4: Min guard  Trunk/Postural Assessment  Cervical Assessment Cervical Assessment: Within Functional Limits Thoracic Assessment Thoracic Assessment: Within Functional Limits Lumbar Assessment Lumbar Assessment: Within Functional Limits Postural Control Postural Control: Deficits on evaluation Postural Limitations: generalized slouched posture with forward flexion  Balance Static Sitting Balance Static Sitting - Balance Support: Feet supported Static Sitting - Level of Assistance: 5: Stand by assistance Static Sitting - Comment/# of Minutes: Seated unsupported to complete bathing task Dynamic Sitting Balance Dynamic Sitting - Balance Support: Feet supported Dynamic Sitting - Level of Assistance: 5: Stand by assistance Dynamic Sitting Balance - Compensations: Seated unsupported to complete bathing task Dynamic Sitting - Balance Activities: Lateral lean/weight shifting;Forward lean/weight shifting;Reaching across midline;Reaching for objects;Trunk control activities Sitting balance - Comments: Seated to compete bathing task on shower chair Static Standing Balance Static Standing - Balance Support: During functional activity;No upper extremity supported Static Standing - Level of Assistance: 4: Min assist Static Standing - Comment/# of Minutes: standing to complete grooming task at the sink Dynamic Standing Balance Dynamic Standing - Balance Support: During functional activity;No upper extremity supported Dynamic Standing - Level of Assistance: 4: Min assist Dynamic Standing - Balance Activities: Forward  lean/weight shifting;Lateral lean/weight shifting Dynamic Standing - Comments: Standing to complete LB bathing and dressing Extremity/Trunk Assessment RUE Assessment RUE Assessment: Exceptions to [facility] RUE AROM (degrees) Overall AROM Right Upper Extremity: Within functional limits for tasks performed RUE Strength RUE Overall Strength: Deficits RUE Overall Strength Comments: shoulder and elbows grossly 4/5,  grip 3+/5 LUE Assessment LUE Assessment: Exceptions to WFL LUE AROM (degrees) Overall AROM Left Upper Extremity: Within functional limits for tasks assessed LUE Strength LUE Overall Strength: Deficits LUE Overall Strength Comments: shoulder and elbows grossly 4/5, but grip 3+/5  FIM:  FIM - Eating Eating Activity: 6: Modified consistency diet: (comment) FIM - Grooming Grooming Steps: Oral care, brush teeth, clean dentures Grooming: 4: Steadying assist  or patient completes 3 of 4 or 4 of 5 steps FIM - Bathing Bathing Steps Patient Completed: Chest;Front perineal area;Right lower leg (including foot);Left lower  leg (including foot);Buttocks;Right Arm;Left Arm;Right upper leg;Left upper leg;Abdomen Bathing: 4: Steadying assist FIM - Upper Body Dressing/Undressing Upper body dressing/undressing steps patient completed: Thread/unthread right sleeve of pullover shirt/dresss;Thread/unthread left sleeve of pullover shirt/dress;Put head through opening of pull over shirt/dress;Pull shirt over trunk Upper body dressing/undressing: 5: Set-up assist to: Obtain clothing/put away FIM - Lower Body Dressing/Undressing Lower body dressing/undressing steps patient completed: Thread/unthread right underwear leg;Thread/unthread left underwear leg;Pull underwear up/down;Thread/unthread right pants leg;Pull pants up/down;Don/Doff right sock;Don/Doff right shoe Lower body dressing/undressing: 3: Mod-Patient completed 50-74% of tasks FIM - Toileting Toileting steps completed by patient: Adjust clothing  prior to toileting;Performs perineal hygiene;Adjust clothing after toileting Toileting Assistive Devices: Grab bar or rail for support Toileting: 5: Supervision: Safety issues/verbal cues FIM - Radio producer Devices: Grab bars Toilet Transfers: 3-To toilet/BSC: Mod A (lift or lower assist);3-From toilet/BSC: Mod A (lift or lower assist) FIM - Systems developer Devices: Grab bars;Shower Clinical biochemist Transfers: 4-Into Tub/Shower: Min A (steadying Pt. > 75%/lift 1 leg);4-Out of Tub/Shower: Min A (steadying Pt. > 75%/lift 1 leg)   Refer to Care Plan for Long Term Goals  Recommendations for other services: None  Discharge Criteria: Patient will be discharged from OT if patient refuses treatment 3 consecutive times without medical reason, if treatment goals not met, if there is a change in medical status, if patient makes no progress towards goals or if patient is discharged from hospital.  The above assessment, treatment plan, treatment alternatives and goals were discussed and mutually agreed upon: by patient and by family  Ernestina Patches 01/25/2015, 5:02 PM

## 2015-01-25 NOTE — Progress Notes (Signed)
[facility] P[facility] Admission Coordinator Signed Physical Medicine and Rehabilitation PMR Pre-admission 01/24/2015 11:21 AM  Related encounter: ED to Hosp-Admission (Discharged) from 01/08/2015 in [facility]    Expand All Collapse All    PMR Admission Coordinator Pre-Admission Assessment  Patient: Alexander Berry is an 49 y.o., male MRN: [id number] DOB: 01/20/1966 Height: 5\' 7"  (170.2 cm) Weight: 85.594 kg (188 lb 11.2 oz)  Insurance Information HMO: PPO: PCP: IPA: 80/20: OTHER:  PRIMARY: Self pay (per the family, pt. Is an illegal resident) Policy#: Subscriber:  CM Name: Phone#: Fax#:  Pre-Cert#: Employer:  Benefits: Phone #: Name:  Eff. Date: Deduct: Out of Pocket Max: Life Max:  CIR: SNF:  Outpatient: Co-Pay:  Home Health: Co-Pay:  DME: Co-Pay:  Providers:    Medicaid Application Date: Case Manager:  Disability Application Date: Case Worker:   Emergency Contact Information Contact Information    Name Relation Home Work Mobile   Berry,Alexander Son [phone]     Berry,Alexander Daughter [phone]     Berry,Alexander    [phone]     Current Medical History  Patient Admitting Diagnosis: debility after cardiac arrest, potential anoxic encephalopathy History of Present Illness: Alexander Berry is a 49 y.o. right handed male with history of hypertension. Independent prior to admission living with wife and family work in [employer]. Admitted 01/08/2015 after witnessed out of hospital cardiac arrest. He was playing soccer when he suddenly collapsed. Bystander CPR was performed. EMS arrival patient was V. fib. He received epinephrine and amiodarone. He was  intubated in the field. EKG showed global ST elevations. Underwent emergent cardiac catheterization with stenting and placed on Brilinta. Echocardiogram with ejection fraction of 60% and mild posterior wall hypokinesis. 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 Cranial CT scan negative. Hospital course respiratory failure as well as acute renal failure creatinine 2.98 with follow-up critical care medicine. Patient self extubated 01/12/2015. Developed persistent fevers suspect sepsis maintained on broad-spectrum antibodies with respiratory cultures 01/11/2015 showing MSSA pneumonia and course of Ancef has been completed. Berry cultures have been negative.. Right upper quadrant ultrasound showed biliary sludge no obstruction. Subcutaneous Lovenox for DVT prophylaxis. Dysphagia #2 nectar thick liquid diet and later advanced to a regular consistency diet. 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 Renal function has improved creatinine 1.07. Physical therapy evaluation completed with recommendations of physical medicine rehabilitation consult      Past Medical History  Past Medical History  Diagnosis Date  . Hypertension     Family History  family history includes Hypertension in his father.  Prior Rehab/Hospitalizations:  Has the patient had major surgery during 100 days prior to admission? No  Current Medications   Current facility-administered medications:  . acetaminophen (TYLENOL) tablet 650 mg, 650 mg, Oral, Q4H PRN, Alexander Blood, MD . amLODipine (NORVASC) tablet 5 mg, 5 mg, Oral, Daily, Alexander Patty, MD, 5 mg at 01/23/15 0857 . antiseptic oral rinse (CPC / CETYLPYRIDINIUM CHLORIDE 0.05%) solution 7 mL, 7 mL, Mouth Rinse, BID, Alexander Dallas, MD, 7 mL at 01/23/15 1000 . aspirin chewable tablet 81 mg, 81 mg, Oral, Daily, Alexander Blood, MD, 81 mg at 01/24/15 0959 . atorvastatin (LIPITOR) tablet 80 mg, 80 mg, Oral, q1800, Alexander Records, MD . carvedilol (COREG) tablet 18.75 mg, 18.75 mg, Oral, BID WC, Alexander Bihari, MD, 18.75 mg at 01/24/15 0958 . dextrose 5 % solution, , Intravenous,  Continuous, Alexander Blood, MD, Last Rate: 75 mL/hr at 01/24/15 0542 . docusate (COLACE) 50 MG/5ML liquid 100 mg, 100 mg, Oral, BID PRN, Alexander Blood, MD . enoxaparin (LOVENOX) injection 40 mg, 40 mg, Subcutaneous, Q24H, Alexander Patty, MD, 40 mg at 01/23/15 1340 . famotidine (PEPCID) 40 MG/5ML suspension 20 mg, 20 mg, Oral, Daily, Alexander Blood, MD, 20 mg at 01/24/15 1007 . feeding supplement (GLUCERNA SHAKE) (GLUCERNA SHAKE) liquid 237 mL, 237 mL, Oral, BID BM, Alexander Berry, RD, 237 mL at 01/24/15 1000 . fentaNYL (SUBLIMAZE) injection 25 mcg, 25 mcg, Intravenous, Q2H PRN, Alexander Bucks, MD, 25 mcg at 01/20/15 0522 . lisinopril (PRINIVIL,ZESTRIL) tablet 10 mg, 10 mg, Oral, Daily, Alexander Patty, MD, 10 mg at 01/23/15 0858 . metoprolol (LOPRESSOR) injection 2.5-5 mg, 2.5-5 mg, Intravenous, Q3H PRN, Alexander Katos, MD, 5 mg at 01/14/15 0341 . multivitamin with minerals tablet 1 tablet, 1 tablet, Oral, Daily, Alexander Berry, RD, 1 tablet at 01/24/15 0958 . ondansetron (ZOFRAN) injection 4 mg, 4 mg, Intravenous, Q6H PRN, Alexander Bihari, MD, 4 mg at 01/19/15 0430 . ticagrelor (BRILINTA) tablet 90 mg, 90 mg, Oral, BID, Alexander Blood, MD, 90 mg at 01/24/15 4098  Patients Current Diet: Diet Heart Room service appropriate?: Yes; Fluid consistency:: Thin  Precautions / Restrictions Precautions Precautions: Fall Precaution Comments: gangrenous left toes (especially 3rd through 5th) Restrictions Weight Bearing Restrictions: No   Has the patient had 2 or more falls or a fall with injury in the past year?No  Prior Activity Level Community (5-7x/wk): Works full time in [employer]. Plays in 2  adult soccer leagues.. Was playing soccer at time of collapse and arrest  Journalist, newspaper / Equipment Home Assistive Devices/Equipment: None Home Equipment: None  Prior Device Use: Indicate devices/aids used by the patient prior to current illness, exacerbation or injury? None of the above  Prior Functional Level Prior Function Level of Independence: Independent  Self Care: Did the patient need help bathing, dressing, using the toilet or eating? Independent  Indoor Mobility: Did the patient need assistance with walking from room to room (with or without device)? Independent  Stairs: Did the patient need assistance with internal or external stairs (with or without device)? Independent  Functional Cognition: Did the patient need help planning regular tasks such as shopping or remembering to take medications? Independent  Current Functional Level Cognition  Overall Cognitive Status: Within Functional Limits for tasks assessed Orientation Level: Oriented to person, Oriented to place, Oriented to time, Disoriented to situation Following Commands: Follows one step commands with increased time, Follows one step commands inconsistently Safety/Judgement: Decreased awareness of safety, Decreased awareness of deficits General Comments: Pt thought he was in [employer]. Attempting to stand stating he wanted to walk.   Extremity Assessment (includes Sensation/Coordination)  Upper Extremity Assessment: RUE deficits/detail, LUE deficits/detail RUE Deficits / Details: 3/5 RUE Coordination: decreased fine motor, decreased gross motor LUE Deficits / Details: 2+/5 LUE Coordination: decreased fine motor, decreased gross motor  Lower Extremity Assessment: Defer to PT evaluation    ADLs  Overall ADL's : Needs assistance/impaired Eating/Feeding: NPO Grooming: Wash/dry hands, Wash/dry face, Oral care, Moderate assistance, Sitting Upper Body Bathing: Maximal assistance,  Sitting Lower Body Bathing: Total assistance, Bed level Upper Body Dressing : Moderate assistance, Sitting Lower Body Dressing: Total assistance, +2 for physical assistance, Bed level Toilet Transfer: +2 for safety/equipment, Maximal assistance, Squat-pivot Toileting- Clothing Manipulation and Hygiene: +2 for physical assistance, Total assistance  Mobility  Overal bed mobility: Needs Assistance Bed Mobility: Rolling, Sidelying to Sit Rolling: Mod assist Sidelying to sit: Max assist General bed mobility comments: in chair beginning and end of session    Transfers  Overall transfer level: Needs assistance Transfers: Sit to/from Stand, Squat Pivot Transfers Sit to Stand: Min assist Squat pivot transfers: Max assist, +2 physical assistance General transfer comment: over 4 trials pt progressed from min assist to minguard with cues for hand placement, scooting and safety. Posterior lean with first 3 trials on initial standing    Ambulation / Gait / Stairs / Wheelchair Mobility  Ambulation/Gait Ambulation/Gait assistance: Min assist, +2 safety/equipment Ambulation Distance (Feet): 54 Feet Assistive device: Rolling walker (2 wheeled) General Gait Details: Pt performed 4 gait trials of 24', 36', 50', 54' respectively with seated rest between each trial. Pt with increased sway and utlization of right quadratus lumborum for gait with scissoring with gait. Cues for posture, position in RW and looking up Gait Pattern/deviations: Step-through pattern, Decreased stride length, Narrow base of support Gait velocity interpretation: Below normal speed for age/gender    Posture / Balance Dynamic Sitting Balance Sitting balance - Comments: flexed posture seated edge of chair with leaning in all directions Balance Overall balance assessment: Needs assistance Sitting-balance support: Feet supported Sitting balance-Leahy Scale: Good Sitting balance - Comments: flexed posture seated edge of  chair with leaning in all directions Standing balance-Leahy Scale: Fair    Special needs/care consideration BiPAP/CPAP no Continuous Drip IV 5%dextrose solution 15mL/hr Oxygen no Skin left 3rd thru 5th toes demarcating and black in appearance; vascular believes toes will autoamputate ; ABIs normal Bowel mgmt: last BM 01/23/15 on [facility] with assist Bladder mgmt: uses urinal, continent per family Diabetic mgmt no     Previous Home Environment Living Arrangements: Spouse/significant other, Children Available Help at Discharge: Family, Available 24 hours/day Type of Home: House Home Layout: One level Home Access: Stairs to enter Secretary/administrator of Steps: 3 Bathroom Shower/Tub: Engineer, manufacturing systems: Standard Home Care Services: No Additional Comments: pt works in Social research officer, government for Discharge Living Setting: Patient's home Type of Home at Discharge: House Discharge Home Layout: One level Discharge Home Access: Stairs to enter Entrance Stairs-Rails: None Entrance Stairs-Number of Steps: 4 Discharge Bathroom Shower/Tub: Tub/shower unit, Curtain Discharge Bathroom Toilet: Standard Discharge Bathroom Accessibility: Yes How Accessible: Accessible via walker Does the patient have any problems obtaining your medications?: No  Social/Family/Support Systems Patient Roles: Spouse, Parent Anticipated Caregiver: wife Alexander Berry and daughter Alexander Berry, age 63 will be available 24/7 to assist pt. as necessary Ability/Limitations of Caregiver: none Caregiver Availability: 24/7 Discharge Plan Discussed with Primary Caregiver: Yes Is Caregiver In Agreement with Plan?: Yes Does Caregiver/Family have Issues with Lodging/Transportation while Pt is in Rehab?: No   Goals/Additional Needs Patient/Family Goal for Rehab: supervision for PT/OT; mod I and supervision for SLP Expected length of stay: 15- 18 days Cultural Considerations: Pt.  from [place], pt. 's primary language is Spanish but has some understanding and ability to speak English (family estimates 50%) Equipment Needs: TBD Pt/Family Agrees to Admission and willing to participate: Yes Program Orientation Provided & Reviewed with Pt/Caregiver Including Roles & Responsibilities: Yes Information Needs to be Provided By: Pt's daughter Alexander speaks English fluently and interrprets for her mom and dad   Decrease burden of Care through IP rehab admission: no   Possible need for SNF placement upon discharge:    Patient Condition: This patient's condition remains as documented  in the consult dated 01/23/15 , in which the Rehabilitation Physician determined and documented that the patient's condition is appropriate for intensive rehabilitative care in an inpatient rehabilitation facility. Will admit to inpatient rehab today.  Preadmission Screen Completed By: Weldon Picking, 01/24/2015 11:33 AM ______________________________________________________________________  Discussed status with Dr. Riley Kill on 01/24/15 at 1132 and received telephone approval for admission today.  Admission Coordinator: Weldon Picking, time 1132 Dorna Bloom 01/24/15          Cosigned by: Ranelle Oyster, MD at 01/24/2015 1:29 PM  Revision History

## 2015-01-26 ENCOUNTER — Inpatient Hospital Stay (HOSPITAL_COMMUNITY): Payer: MEDICAID | Admitting: Speech Pathology

## 2015-01-26 ENCOUNTER — Inpatient Hospital Stay (HOSPITAL_COMMUNITY): Payer: MEDICAID | Admitting: Physical Therapy

## 2015-01-26 ENCOUNTER — Inpatient Hospital Stay (HOSPITAL_COMMUNITY): Payer: MEDICAID

## 2015-01-26 MED ORDER — FAMOTIDINE 20 MG PO TABS
20.0000 mg | ORAL_TABLET | Freq: Every day | ORAL | Status: DC
  Administered 2015-01-26 – 2015-02-02 (×8): 20 mg via ORAL
  Filled 2015-01-26 (×9): qty 1

## 2015-01-26 NOTE — Progress Notes (Signed)
Occupational Therapy Session Note  Patient Details  Name: Alexander Berry MRN: [id number] Date of Birth: 1966/03/01  Today's Date: 01/26/2015 OT Individual Time: 1000-1100 OT Individual Time Calculation (min): 60 min    Short Term Goals: Week 1:  OT Short Term Goal 1 (Week 1): LTG=STG  Skilled Therapeutic Interventions/Progress Updates: ADL-retraining with focus on dynamic standing balance, functional mobility, safety awareness, coordination.   Pt received seated in w/c with family and interpreter present.   Pt reports sharp pain at left foot, from small toe to 3rd toe; RN and PA aware.   Pt reports increased pain since hitting toes at night on foot board.   OT requested orthotics: darco shoe and prevalon boot to reduce incidental reinjury.    Pt assessed by OT, RN and vascular PA and educated on treatment planned to continue unrestricted weight-bearing and showers, with placement of guaze between toes by RN after bathing.    Pt then ambulated to bathroom with contact guard and transferred to shower chair with supervision.   Pt required steadying assist while standing to wash buttocks and peri-area d/t LOB (posterior).   Pt ambulates with scissoring gait and demo's incoordination of right UE, overshooting target slightly.    Pt dressed with min assist to steady while standing and groomed at sink, also with steadying assist.   Pt reported fatigue following BADL and returned to edge of bed to rest at end of session with all needs within reach.       Therapy Documentation Precautions:  Precautions Precautions: Fall Precaution Comments: gangrenous left  toes (especially 3rd through 5th); monitor HR and O2; Requires frequent rest breaks during functional tasks Restrictions Weight Bearing Restrictions: No (pink sheet says WB as tolerated)  Pain: Pain Assessment Pain Assessment: 0-10 Pain Score: 7  Faces Pain Scale: Hurts a little bit Pain Type: Acute pain Pain Location: Foot Pain Orientation:  Left Pain Descriptors / Indicators: Sharp Pain Frequency: Constant Pain Onset: On-going Patients Stated Pain Goal: 2 Pain Intervention(s): Other (Comment) (asked RN for cough syrup) Multiple Pain Sites: No  See FIM for current functional status  Therapy/Group: Individual Therapy  Anairis Knick 01/26/2015, 11:15 AM

## 2015-01-26 NOTE — Progress Notes (Signed)
Speech Language Pathology Daily Session Note  Patient Details  Name: Alexander Berry MRN: [id number] Date of Birth: 24-Feb-1966  Today's Date: 01/26/2015 SLP Individual Time: 1100-1200 SLP Individual Time Calculation (min): 60 min  Short Term Goals: Week 1: SLP Short Term Goal 1 (Week 1): Patient will utilize an increased vocal itensity at the phrase level with Mod I.  SLP Short Term Goal 2 (Week 1): Patient will demonstrate functional problem solving for basic and familiar tasks with supervision verbal and question cues.  SLP Short Term Goal 3 (Week 1): Patient will demonstrate selective attention in a mildly distracting enviornment for 60 minutes with supervision verbal cues.  SLP Short Term Goal 4 (Week 1): Patient will utilize external memory aids to recall new, daily information with supervision verbal cues.  SLP Short Term Goal 5 (Week 1): Patient will demonstrate anticipatory awareness in regards to d/c planning and identiyfing barriers/activities that will require assistance at discharge with supervision verbal questions.   Skilled Therapeutic Interventions: Skilled treatment session focused on cognitive goals. SLP facilitated session by administering the MoCA in Spanish with interpreter.  Patient scored 22/30 points with a score of 26 or above considered normal. Patient demonstrated impairments in the areas of recall, attention and word fluency.  SLP also facilitated initiation of memory compensatory strategies with use of a notebook and pen to record important information and increase recall/carryover.  Patient verbalized understanding. Patient left upright in wheelchair with family present. Continue with current plan of care.     FIM:  Comprehension Comprehension Mode: Auditory Comprehension: 4-Understands basic 75 - 89% of the time/requires cueing 10 - 24% of the time Expression Expression Mode: Verbal Expression: 5-Expresses basic 90% of the time/requires cueing < 10% of the  time. Social Interaction Social Interaction: 5-Interacts appropriately 90% of the time - Needs monitoring or encouragement for participation or interaction. Problem Solving Problem Solving: 5-Solves basic 90% of the time/requires cueing < 10% of the time Memory Memory: 3-Recognizes or recalls 50 - 74% of the time/requires cueing 25 - 49% of the time  Pain No/Denies Pain   Therapy/Group: Individual Therapy  Aide Wojnar 01/26/2015, 5:03 PM

## 2015-01-26 NOTE — Progress Notes (Signed)
Physical Therapy Session Note  Patient Details  Name: Alexander Berry MRN: [id number] Date of Birth: 04/09/1966  Today's Date: 01/26/2015 PT Individual Time:  -  [phone] Treatment Session 2: 1500-1530 Treatment Time: 60 min Treatment Session 2: 30 min     Short Term Goals: Week 1:  PT Short Term Goal 1 (Week 1): STGs = LTGs due to ELOS  Skilled Therapeutic Interventions/Progress Updates:    Treatment Session 1: Gait Training: PT instructs pt in ambulation without AD with +2 for w/c follow x 160' req min-mod A for balance and excessive lateral trunk sway noted. SaO2 drops to 93% on RA with this activity, but recovers to baseline levels with seated rest break.  PT instructs pt in ascending/descending 3 stairs with B handrails in step-to manner req min A for balance and increased time x 3 sets.   Neuromuscular Reeducation: PT instructs pt in UE diagonals with orange Pep Band: 3 x 10 reps each diagonal, for improved postural muscle activation in sitting.   Therapeutic Activity: PT instructs pt in car transfer with HHA from PT req mod A for balance into the car and pt demonstrates a single leg support side step technique. AFter sitting, PT explains that next time, pt should turn his back to the car, sit down, then slide legs in.   W/C Management: PT adjusts pt's legrests to make it even and obtains an 18x16 foam cushion for improved comfort & pressure relief for pt. PT instructs pt in self propelling manual w/c with B UEs x 200' req   Treatment Session 2: Neuromuscular Reeducation: PT instructs pt in TUG x 3 reps req min-mod A and pt scores: 22 seconds, 17 seconds, and 16 seconds.  PT instructs pt in Berg Balance Test and pt scores: 28/56, indicating significant risk of falls.   Pt very fatigued by the end of the day, but participates well. Continue per PT POC.    Therapy Documentation Precautions:  Precautions Precautions: Fall Precaution Comments: gangrenous left  toes (especially  3rd through 5th); monitor HR and O2; Requires frequent rest breaks during functional tasks Restrictions Weight Bearing Restrictions: No (pink sheet says WB as tolerated) Pain: Pain Assessment Pain Assessment: 0-10 Pain Score: 8  Pain Type: Acute pain Pain Location: Chest Pain Orientation: Left Pain Descriptors / Indicators: Pounding Pain Onset: Other (Comment) (with coughing) Pain Intervention(s): Other (Comment) (asked RN for cough syrup) Multiple Pain Sites: No Treatment Session 2: Pt c/o 6/10 pain in L last 3 toes - PT uses rest to decrease pt's pain.   Balance: Balance Balance Assessed: Yes Standardized Balance Assessment Standardized Balance Assessment: Timed Up and Go Test;Berg Balance Test Berg Balance Test Sit to Stand: Able to stand  independently using hands Standing Unsupported: Able to stand 2 minutes with supervision Sitting with Back Unsupported but Feet Supported on Floor or Stool: Able to sit safely and securely 2 minutes Stand to Sit: Controls descent by using hands Transfers: Able to transfer safely, definite need of hands Standing Unsupported with Eyes Closed: Able to stand 10 seconds with supervision Standing Ubsupported with Feet Together: Able to place feet together independently and stand for 1 minute with supervision From Standing, Reach Forward with Outstretched Arm: Reaches forward but needs supervision From Standing Position, Pick up Object from Floor: Able to pick up shoe, needs supervision From Standing Position, Turn to Look Behind Over each Shoulder: Needs supervision when turning Turn 360 Degrees: Needs assistance while turning Standing Unsupported, Alternately Place Feet on Step/Stool: Able to  complete >2 steps/needs minimal assist Standing Unsupported, One Foot in Front: Loses balance while stepping or standing Standing on One Leg: Unable to try or needs assist to prevent fall Total Score: 28 Timed Up and Go Test TUG: Normal TUG Normal TUG  (seconds): 16  See FIM for current functional status  Therapy/Group: Individual Therapy  Kaleigha Chamberlin M 01/26/2015, 8:31 AM

## 2015-01-26 NOTE — Progress Notes (Signed)
[facility] PHYSICAL MEDICINE & REHABILITATION     PROGRESS NOTE    Subjective/Complaints: No complaints. Feeling well. Likes therapy.   Objective: Vital Signs: Blood pressure 100/58, pulse 75, temperature 97.9 F (36.6 C), temperature source Oral, resp. rate 20, height  (1.702 m), weight 85.5 kg (188 lb 7.9 oz), SpO2 99 %. No results found.  Recent Labs  01/24/15 1951 01/25/15 0753  WBC 7.6 6.9  HGB 8.9* 9.5*  HCT 27.8* 29.8*  PLT 741* 882*    Recent Labs  01/24/15 0430 01/24/15 1951 01/25/15 0753  NA 139  --  139  K 3.9  --  4.1  CL 112*  --  110  GLUCOSE 106*  --  97  BUN 32*  --  26*  CREATININE 0.96 1.05 1.16  CALCIUM 8.1*  --  8.8*   CBG (last 3)   Recent Labs  01/23/15 1132 01/25/15 2055  GLUCAP 107* 124*    Wt Readings from Last 3 Encounters:  01/25/15 85.5 kg (188 lb 7.9 oz)  01/24/15 85.594 kg (188 lb 11.2 oz)    Physical Exam:   Constitutional: He appears well-developed. No distress HENT: dentition fair Head: Normocephalic.  Eyes: EOM are normal.  Neck: Normal range of motion. Neck supple. No thyromegaly present.  Cardiovascular: Normal rate and regular rhythm.  Respiratory: Effort normal and breath sounds normal. No respiratory distress.  GI: Soft. Bowel sounds are normal. He exhibits no distension.  Neurological: He is alert.  Limited English. He was able to follow simple commands and answer simple yes no questions. UE 4/5 prox to distal. LE: 3-/5 hf, 3/5ke, 4/5 ankles, some pain limitations  Skin: Lateral three toes on left foot are starting to demarcate. He has pedal pulses with tips of toes significant ischemic changes, especially lateral 2. Toes remain tender to touch. Some bruising over the knees/shins Psychiatric: He has a normal mood and affect. His behavior is normal  Assessment/Plan: 1. 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 Physiatrist is providing close team supervision and 24 hour management of active medical problems listed below. Physiatrist and rehab team continue to assess barriers to discharge/monitor patient progress toward functional and medical goals. FIM: FIM - Bathing Bathing Steps Patient Completed: Chest, Front perineal area, Right lower leg (including foot), Left lower leg (including foot), Buttocks, Right Arm, Left Arm, Right upper leg, Left upper leg, Abdomen Bathing: 4: Steadying assist  FIM - Upper Body Dressing/Undressing Upper body dressing/undressing steps patient completed: Thread/unthread right sleeve of pullover shirt/dresss, Thread/unthread left sleeve of pullover shirt/dress, Put head through opening of pull over shirt/dress, Pull shirt over trunk Upper body dressing/undressing: 5: Set-up assist to: Obtain clothing/put away FIM - Lower Body Dressing/Undressing Lower body dressing/undressing steps patient completed: Thread/unthread right underwear leg, Thread/unthread left underwear leg, Pull underwear up/down, Thread/unthread right pants leg, Pull pants up/down, Don/Doff right sock, Don/Doff right shoe Lower body dressing/undressing: 3: Mod-Patient completed 50-74% of tasks  FIM - Toileting Toileting steps completed by patient: Adjust clothing prior to toileting, Performs perineal hygiene, Adjust clothing after toileting Toileting Assistive Devices: Grab bar or rail for support Toileting: 5: Supervision: Safety issues/verbal cues  FIM - Diplomatic Services operational officer Devices: Grab bars Toilet Transfers: 3-To toilet/BSC: Mod A (lift or lower assist), 3-From toilet/BSC: Mod A (lift or lower assist)  FIM - Banker Devices: Arm rests Bed/Chair Transfer:  4: Supine > Sit: Min A (steadying Pt. > 75%/lift 1 leg), 5: Sit > Supine: Supervision (verbal cues/safety issues),  4: Bed > Chair or W/C: Min A (steadying Pt. > 75%), 4: Chair or W/C > Bed: Min A (steadying Pt. > 75%)  FIM - Locomotion: Wheelchair Distance: 75 Locomotion: Wheelchair: 2: Travels 50 - 149 ft with minimal assistance (Pt.>75%) FIM - Locomotion: Ambulation Locomotion: Ambulation Assistive Devices: Other (comment) (HHA) Ambulation/Gait Assistance: 3: Mod assist Locomotion: Ambulation: 2: Travels 50 - 149 ft with moderate assistance (Pt: 50 - 74%)  Comprehension Comprehension Mode: Auditory Comprehension: 4-Understands basic 75 - 89% of the time/requires cueing 10 - 24% of the time  Expression Expression Mode: Verbal Expression: 4-Expresses basic 75 - 89% of the time/requires cueing 10 - 24% of the time. Needs helper to occlude trach/needs to repeat words.  Social Interaction Social Interaction: 5-Interacts appropriately 90% of the time - Needs monitoring or encouragement for participation or interaction.  Problem Solving Problem Solving: 4-Solves basic 75 - 89% of the time/requires cueing 10 - 24% of the time  Memory Memory: 3-Recognizes or recalls 50 - 74% of the time/requires cueing 25 - 49% of the time  Medical Problem List and Plan: 1. Functional deficits secondary to debilitation after cardiac arrest status post stenting/potential anoxic encephalopathy 2. DVT Prophylaxis/Anticoagulation: Subcutaneous Lovenox. Monitor platelet counts of any signs of bleeding 3. Pain Management: Tylenol as needed 4. Acute renal insufficiency. Creatinine rebounding to 1.05. Follow-up chemistries 5. Neuropsych: This patient is capable of making decisions on his own behalf. 6. Skin/Wound Care: Routine skin checks 7. Fluids/Electrolytes/Nutrition: encourage PO. Eating well 8. Gangrenous ischemic toes. Continued conservative care for now. Will need surgical intervention at some point. 9. MSSA pneumonia. Patient has completed course of Ancef 10. Hypertension. Norvasc 5 mg daily, Coreg 18.75 mg twice  a day, lisinopril 10 mg daily, Aldactone 12.5 mg daily. Monitor with increased mobility 11. Anemia: hgb improving to 9.5  LOS (Days) 2 A FACE TO FACE EVALUATION WAS PERFORMED  SWARTZ,ZACHARY T 01/26/2015 8:22 AM

## 2015-01-26 NOTE — Progress Notes (Signed)
Inpatient Rehabilitation Center Individual Statement of Services  Patient Name:  Alexander Berry  Date:  01/26/2015  Welcome to the Inpatient Rehabilitation Center.  Our goal is to provide you with an individualized program based on your diagnosis and situation, designed to meet your specific needs.  With this comprehensive rehabilitation program, you will be expected to participate in at least 3 hours of rehabilitation therapies Monday-Friday, with modified therapy programming on the weekends.  Your rehabilitation program will include the following services:  Physical Therapy (PT), Occupational Therapy (OT), Speech Therapy (ST), 24 hour per day rehabilitation nursing, Case Management (Social Worker), Rehabilitation Medicine, Nutrition Services and Pharmacy Services  Weekly team conferences will be held on Tuesdays to discuss your progress.  Your Social Worker will talk with you frequently to get your input and to update you on team discussions.  Team conferences with you and your family in attendance may also be held.  Expected length of stay:  7 to 10 days  Overall anticipated outcome:  Supervision to Modified Independent   Depending on your progress and recovery, your program may change. Your Social Worker will coordinate services and will keep you informed of any changes. Your Social Worker's name and contact numbers are listed  below.  The following services may also be recommended but are not provided by the Inpatient Rehabilitation Center:   Driving Evaluations  Home Health Rehabiltiation Services  Outpatient Rehabilitation Services  Vocational Rehabilitation   Arrangements will be made to provide these services after discharge if needed.  Arrangements include referral to agencies that provide these services.  Your insurance has been verified to be:  Applied for Medicaid Your primary doctor is:  You do not have one - plan to establish care at [facility]  Pertinent information  will be shared with your doctor and your insurance company.  Social Worker:  Staci Acosta, LCSW  [phone] or [phone]  Information discussed with and copy given to patient by: Elvera Lennox, 01/26/2015, 2:37 PM

## 2015-01-26 NOTE — Progress Notes (Signed)
Orthopedic Tech Progress Note Patient Details:  Alexander Berry Nov 16, 1965 [id number]  Ortho Devices Type of Ortho Device: Postop shoe/boot, Other (comment) Ortho Device/Splint Location: LLE Darco Ortho Device/Splint Interventions: Application   Asia R Thompson 01/26/2015, 11:14 AM

## 2015-01-26 NOTE — Progress Notes (Signed)
  Subjective: c/o pain in 3rd, 4th, & 5th toes left foot.  States he stubbed his toes last night.    Afebrile VSS  Objective: Left 3rd, 4th, & 5th toes continue to demarcate and gangrenous tips to all 3. 2+ palpable bilateral DP pulses bilaterally  Assessment/Plan: -continue to let toes demarcate -dry gauze between toes -may use Darco shoe to protect toes -may need amputation of toes (3rd-5th).  He does have palpable DP pulses bilaterally.   Doreatha Massed 01/26/2015 10:47 AM

## 2015-01-27 ENCOUNTER — Inpatient Hospital Stay (HOSPITAL_COMMUNITY): Payer: Self-pay | Admitting: Speech Pathology

## 2015-01-27 ENCOUNTER — Inpatient Hospital Stay (HOSPITAL_COMMUNITY): Payer: MEDICAID | Admitting: Physical Therapy

## 2015-01-27 ENCOUNTER — Inpatient Hospital Stay (HOSPITAL_COMMUNITY): Payer: MEDICAID

## 2015-01-27 NOTE — Progress Notes (Signed)
[place] PHYSICAL MEDICINE & REHABILITATION     PROGRESS NOTE    Subjective/Complaints: Up in bathroom. No complaints.  ROS: Pt denies fever, rash/itching, headache, blurred or double vision, nausea, vomiting, abdominal pain, diarrhea, chest pain, shortness of breath, palpitations, dysuria, dizziness, neck or back pain, bleeding, anxiety, or depression   Objective: Vital Signs: Blood pressure 91/55, pulse 72, temperature 97.7 F (36.5 C), temperature source Oral, resp. rate 17, height  (1.702 m), weight 85.5 kg (188 lb 7.9 oz), SpO2 96 %. No results found.  Recent Labs  01/24/15 1951 01/25/15 0753  WBC 7.6 6.9  HGB 8.9* 9.5*  HCT 27.8* 29.8*  PLT 741* 882*    Recent Labs  01/24/15 1951 01/25/15 0753  NA  --  139  K  --  4.1  CL  --  110  GLUCOSE  --  97  BUN  --  26*  CREATININE 1.05 1.16  CALCIUM  --  8.8*   CBG (last 3)   Recent Labs  01/25/15 2055  GLUCAP 124*    Wt Readings from Last 3 Encounters:  01/25/15 85.5 kg (188 lb 7.9 oz)  01/24/15 85.594 kg (188 lb 11.2 oz)    Physical Exam:   Constitutional: He appears well-developed. No distress HENT: dentition fair Head: Normocephalic.  Eyes: EOM are normal.  Neck: Normal range of motion. Neck supple. No thyromegaly present.  Cardiovascular: Normal rate and regular rhythm.  Respiratory: Effort normal and breath sounds normal. No respiratory distress.  GI: Soft. Bowel sounds are normal. He exhibits no distension.  Neurological: He is alert.  Limited English but can communicate fairly well. He was able to follow simple commands and answer simple yes no questions. UE 4/5 prox to distal. LE: 3-/5 hf, 3/5ke, 4/5 ankles, some pain limitations  Skin: Lateral three toes on left foot are starting to demarcate. He has pedal pulses with tips of toes significant ischemic changes, especially lateral 2. Toes remain tender to touch. Some bruising over the knees/shins Psychiatric: He has a normal mood  and affect. His behavior is normal  Assessment/Plan: 1. 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 Physiatrist is providing close team supervision and 24 hour management of active medical problems listed below. Physiatrist and rehab team continue to assess barriers to discharge/monitor patient progress toward functional and medical goals. FIM: FIM - Bathing Bathing Steps Patient Completed: Chest, Right Arm, Left Arm, Abdomen, Front perineal area, Buttocks, Left upper leg, Right upper leg, Right lower leg (including foot), Left lower leg (including foot) Bathing: 4: Steadying assist  FIM - Upper Body Dressing/Undressing Upper body dressing/undressing steps patient completed: Thread/unthread right sleeve of pullover shirt/dresss, Thread/unthread left sleeve of pullover shirt/dress, Put head through opening of pull over shirt/dress, Pull shirt over trunk Upper body dressing/undressing: 5: Set-up assist to: Obtain clothing/put away FIM - Lower Body Dressing/Undressing Lower body dressing/undressing steps patient completed: Thread/unthread right underwear leg, Thread/unthread left underwear leg, Pull underwear up/down, Thread/unthread right pants leg, Thread/unthread left pants leg, Pull pants up/down, Fasten/unfasten pants, Don/Doff right sock, Don/Doff left sock, Don/Doff right shoe, Don/Doff left shoe Lower body dressing/undressing: 4: Steadying Assist  FIM - Toileting Toileting steps completed by patient: Adjust clothing prior to toileting, Performs perineal hygiene, Adjust clothing after toileting Toileting Assistive Devices: Grab bar or rail for support Toileting: 5: Supervision: Safety issues/verbal cues  FIM - Diplomatic Services operational officer Devices:  Grab bars Toilet Transfers: 3-To toilet/BSC: Mod A (lift or lower assist),  3-From toilet/BSC: Mod A (lift or lower assist)  FIM - Press photographer Assistive Devices: Arm rests (HHA) Bed/Chair Transfer: 4: Chair or W/C > Bed: Min A (steadying Pt. > 75%)  FIM - Locomotion: Wheelchair Distance: 200 Locomotion: Wheelchair: 5: Travels 150 ft or more: maneuvers on rugs and over door sills with supervision, cueing or coaxing FIM - Locomotion: Ambulation Locomotion: Ambulation Assistive Devices:  (HHA) Ambulation/Gait Assistance: 3: Mod assist Locomotion: Ambulation: 3: Travels 150 ft or more with moderate assistance (Pt: 50 - 74%)  Comprehension Comprehension Mode: Auditory Comprehension: 5-Understands complex 90% of the time/Cues < 10% of the time  Expression Expression Mode: Verbal Expression: 6-Expresses complex ideas: With extra time/assistive device  Social Interaction Social Interaction: 6-Interacts appropriately with others with medication or extra time (anti-anxiety, antidepressant).  Problem Solving Problem Solving: 5-Solves complex 90% of the time/cues < 10% of the time  Memory Memory: 5-Recognizes or recalls 90% of the time/requires cueing < 10% of the time  Medical Problem List and Plan: 1. Functional deficits secondary to debilitation after cardiac arrest status post stenting/potential anoxic encephalopathy 2. DVT Prophylaxis/Anticoagulation: Subcutaneous Lovenox. Monitor platelet counts of any signs of bleeding 3. Pain Management: Tylenol as needed 4. Acute renal insufficiency. Creatinine improved. Voiding/drinking well 5. Neuropsych: This patient is capable of making decisions on his own behalf. 6. Skin/Wound Care: Routine skin checks 7. Fluids/Electrolytes/Nutrition: encourage PO. Eating well 8. Gangrenous ischemic toes. Continued conservative care for now. Will need surgical intervention at some point. 9. MSSA pneumonia. Patient has completed course of Ancef 10. Hypertension. Norvasc 5 mg daily, Coreg 18.75 mg twice a  day, lisinopril 10 mg daily, Aldactone 12.5 mg daily. May be able to back off these somewhat 11. Anemia: hgb improving to 9.5  LOS (Days) 3 A FACE TO FACE EVALUATION WAS PERFORMED  Braylei Totino T 01/27/2015 8:18 AM

## 2015-01-27 NOTE — Progress Notes (Signed)
Social Work Assessment and Plan  Patient Details  Name: Alexander Berry MRN: [id number] Date of Birth: 11/21/1965  Today's Date: 01/27/2015  Problem List:  Patient Active Problem List   Diagnosis Date Noted  . Gangrene of toe   . Cardiogenic shock   . Essential hypertension   . Gangrene of foot   . Acute on chronic renal failure   . Hypernatremia   . Shock liver   . Anoxic encephalopathy   . Dysphagia   . Absolute anemia   . Anoxic brain injury   . AKI (acute kidney injury)   . Septic shock 01/13/2015  . ARDS (adult respiratory distress syndrome) 01/13/2015  . Thrombocytopenia 01/13/2015  . Staphylococcal pneumonia 01/13/2015  . Encounter for intubation 01/12/2015  . Pneumonia, organism unspecified   . Acute pulmonary edema   . Hyperglycemia   . Encephalopathy acute   . Cardiac arrest 01/08/2015  . Acute respiratory failure with hypoxia 01/08/2015  . Hypokalemia 01/08/2015  . Ventricular tachycardia   . Acute ST elevation myocardial infarction (STEMI) involving right coronary artery    Past Medical History:  Past Medical History  Diagnosis Date  . Hypertension   . Difficult intubation     difficult airway/FYI 01/08/2015   Past Surgical History:  Past Surgical History  Procedure Laterality Date  . Cardiac catheterization N/A 01/08/2015    Procedure: Left Heart Cath and Coronary Angiography;  Surgeon: Troy Sine, MD;  Location: [facility];  Service: Cardiovascular;  Laterality: N/A;  . Cardiac catheterization  01/08/2015    Procedure: Coronary Stent Intervention;  Surgeon: Troy Sine, MD;  Location: [facility];  Service: Cardiovascular;;  . Cardiac catheterization  01/08/2015    Procedure: Coronary Balloon Angioplasty;  Surgeon: Troy Sine, MD;  Location: [facility];  Service: Cardiovascular;;  . Electrophysiologic study  01/08/2015    Procedure: Cardioversion;  Surgeon: Troy Sine, MD;  Location: [facility];  Service:  Cardiovascular;;   Social History:  has no tobacco, alcohol, and drug history on file.  Family / Support Systems Marital Status: Married How Long?: 23 years Patient Roles: Spouse, Parent, Other (Comment) (employee) Spouse/Significant Other: Alexander Berry - wife - [phone] Children: Alexander Berry - dtr - [phone]; Alexander Berry - son - [phone] Anticipated Caregiver: wife Alexander Berry and daughter Alexander Berry, age 62 will be available 24/7 to assist pt. as necessary Ability/Limitations of Caregiver: none Caregiver Availability: 24/7 Family Dynamics: close, supportive family  Social History Preferred language: Spanish Religion:  Cultural Background: Pt is from [place].  He has been here for 17 years. 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  He is able to cope with these feelings and does not report significant depresssion signs/symptoms, but has told CSW he will inform CSW/staff should this change. Recent Psychosocial Issues: just this event; now worried about finances Psychiatric History: none reported Substance Abuse History: none reported  Patient / Family Perceptions, Expectations & Goals Pt/Family understanding of illness & functional limitations: Pt/family feel  they have had their questions answered and have a good understanding of what has happened to pt. 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  Pt also enjoys playing soccer in his free time and watching soccer. Anticipated changes in roles/activities/participation: Pt will be out of work while he recovers and may not be able to play soccer due to ischemic toes. Pt/family expectations/goals: Pt wants to get home and get better.  Community Resources Express Scripts: None Premorbid Home Care/DME Agencies: None Transportation available at discharge: family Resource referrals recommended: Neuropsychology  Discharge Planning Living Arrangements: Spouse/significant other, Children (twins (30? years old); 20 y/o dtr) Support Systems: Spouse/significant other, Children, Friends/neighbors (35 y/o son living on his own; other family members are in [place].  Soccer friends.) Type of Residence: Private residence [employer] Resources: Self-pay (Pt has already met with Development worker, community and applied for [employer] in [place]) Museum/gallery curator Resources: Employment, [employer] (wife is working) Museum/gallery curator Screen Referred: Previously completed Living Expenses: Medical laboratory scientific officer Management: Patient, Spouse Does the patient have any problems obtaining your medications?: Yes (Describe) (no insurance) Home Management: Pt's family can manage the home. Patient/Family Preliminary Plans: Pt will go home with his dtr and wife to provide care. Barriers to Discharge: Steps Social Work Anticipated Follow Up Needs: HH/OP Expected length of stay: 15- 18 days  Clinical Impression CSW met with pt, pt's wife, and pt's dtr to introduce self and role as well as to complete assessment.  Pt and family were very appreciative of pt's opportunity for rehab and for CSW visit.  Pt is appropriately tired and sad at times, but does not feel depressed.  He will let CSW, staff, or family know if he experiences anything more than fatigue or sadness.  Pt is able to cope and move on with the work he needs to do with therapy.  Pt's wife has a job, but has been with pt since  his hospitalization.  She has completed FMLA paperwork, but asked that CSW ask MD for a letter stating she has been with pt in the hospital.  CSW also gave them information on [facility] in [place] and asked that someone go by there to get a new pt packet so pt can get established with them for post hospital f/u visit.  Also gave them information on pt assistance programs for pt's hospital bill.  No other needs noted at this time.  During [place] visit yesterday, interpreter had already left, so pt gave permission for his dtr to interpret.  Today CSW used interpreter for visit.  CSW will continue to follow and assist as needed.  Claudetta Sallie, Silvestre Mesi 01/27/2015, 11:56 AM

## 2015-01-27 NOTE — Progress Notes (Signed)
Speech Language Pathology Daily Session Note  Patient Details  Name: Alexander Berry MRN: [id number] Date of Birth: 1966-04-28  Today's Date: 01/27/2015 SLP Individual Time: 0903-1003 SLP Individual Time Calculation (min): 60 min  Short Term Goals: Week 1: SLP Short Term Goal 1 (Week 1): Patient will utilize an increased vocal itensity at the phrase level with Mod I.  SLP Short Term Goal 2 (Week 1): Patient will demonstrate functional problem solving for basic and familiar tasks with supervision verbal and question cues.  SLP Short Term Goal 3 (Week 1): Patient will demonstrate selective attention in a mildly distracting enviornment for 60 minutes with supervision verbal cues.  SLP Short Term Goal 4 (Week 1): Patient will utilize external memory aids to recall new, daily information with supervision verbal cues.  SLP Short Term Goal 5 (Week 1): Patient will demonstrate anticipatory awareness in regards to d/c planning and identiyfing barriers/activities that will require assistance at discharge with supervision verbal questions.   Skilled Therapeutic Interventions:  Pt was seen for skilled ST targeting cognitive goals.  Upon arrival, pt was seated upright in wheelchair, awake, alert, and agreeable to participate in ST.  Interpreter was present for the duration of today's therapy session.  SLP facilitated the session with education regarding diaphragmatic breathing exercises to target improved vocal intensity for speech intelligibility.  Pt returned demonstration of breathing exercises both with and without manipulatives with min-supervision cues.  During functional conversations with SLP, pt required min assist verbal cues to slow rate and increase vocal intensity to achieve intelligibility in phrases.  In a more structured picture description task, pt was able to coordinate onset of phonation with respiration with min assist-supervision cues; however, he continued to require min assist to increase  vocal intensity and slow rate.  Pt was also noted with decreased recall of daily information, including current goals for ST.  Therefore, SLP provided pt with a handout of recommended intelligibility strategies and posted it in pt's room.  Pt left upright in wheelchair with all needs left within reach and family present.  Continue per current plan of care.    FIM:  Comprehension Comprehension Mode: Auditory Comprehension: 5-Understands complex 90% of the time/Cues < 10% of the time Expression Expression Mode: Verbal Expression: 6-Expresses complex ideas: With extra time/assistive device Social Interaction Social Interaction: 6-Interacts appropriately with others with medication or extra time (anti-anxiety, antidepressant). Problem Solving Problem Solving: 4-Solves basic 75 - 89% of the time/requires cueing 10 - 24% of the time Memory Memory: 4-Recognizes or recalls 75 - 89% of the time/requires cueing 10 - 24% of the time  Pain Pain Assessment Pain Assessment: No/denies pain  Therapy/Group: Individual Therapy  Ann Groeneveld, Melanee Spry 01/27/2015, 3:40 PM

## 2015-01-27 NOTE — Progress Notes (Signed)
Physical Therapy Session Note  Patient Details  Name: Alexander Berry MRN: [id number] Date of Birth: 1965-09-02  Today's Date: 01/27/2015 PT Individual Time:  -  1000-1100 Treatment Time: 60 min     Short Term Goals: Week 1:  PT Short Term Goal 1 (Week 1): STGs = LTGs due to ELOS  Skilled Therapeutic Interventions/Progress Updates:  Medical interpreter present throughout session.     Gait Training: PT instructs pt in ambulation without AD using HHA x 150' req min-mod A for balance and repeated verbal cues for lifting his head.  PT instructs pt in ascending/descending with R rail req min A to challenge balance, more.   Neuromuscular Reeducation: PT administers the FGA and pt scores: 2/30, indicating high falls risk.  PT applies kinesiotape in v-pattern on pt's back and on cervical muscle extensors for tactile cues for upright posture. PT explains to pt that if a rash develops, the tape can be removed at any time, otherwise the tape can stay on for 3 days and then be removed, and it is ok to get wet in the shower. Pt and family verbalize understanding.   Functional Gait Assessment (FGA) Requirements: A marked 6-m (20-ft) walkway that is marked with a 30.48-cm (12-in) width.  _0_ 1. GAIT LEVEL SURFACE: 11.98 seconds Instructions: Walk at your normal speed from here to the next mark (6 m[20 ft]). Grading: Loraine Leriche the highest category that applies. (3) Normal-Walks 6 m (20 ft) in less than 5.5 seconds, no assistive devices, good speed, no evidence for imbalance, normal gait pattern, deviates no more than 15.24 cm (6 in) outside of the 30.48-cm (12-in) walkway width. (2) Mild impairment-Walks 6 m (20 ft) in less than 7 seconds but greater than 5.5 seconds, uses assistive device, slower speed, mild gait deviations, or deviates 15.24-25.4 cm (6-10 in) outside of the 30.48-cm (12-in) walkway width. (1) Moderate impairment-Walks 6 m (20 ft), slow speed, abnormal gait pattern, evidence for imbalance,  or deviates 25.4-38.1 cm (10-15 in) outside of the 30.48-cm (12-in) walkway width. Requires more than 7 seconds to ambulate 6 m (20 ft). (0) Severe impairment-Cannot walk 6 m (20 ft) without assistance,severe gait deviations or imbalance, deviates greater than 38.1 cm (15 in) outside of the 30.48-cm (12-in) walkway width or reaches and touches the wall.  _1_ 2. CHANGE IN GAIT SPEED Instructions: Begin walking at your normal pace (for 1.5 m [5 ft]). When I tell you "go," walk as fast as you can (for 1.5 m [5 ft]). When I tell you "slow," walk as slowly as you can (for 1.5 m [5 ft]). Grading: Loraine Leriche the highest category that applies. (3) Normal-Able to smoothly change walking speed without loss of balance or gait deviation. Shows a significant difference in walking speeds between normal, fast, and slow speeds. Deviates no more than 15.24 cm (6 in) outside of the 30.48-cm (12-in) walkway width. (2) Mild impairment-Is able to change speed but demonstrates mild gait deviations, deviates 15.24-25.4 cm (6-10 in) outside of the 30.48-cm (12-in) walkway width, or no gait deviations but unable to achieve a significant change in velocity, or uses an assistive device. (1) Moderate impairment-Makes only minor adjustments to walking speed, or accomplishes a change in speed with significant gait deviations, deviates 25.4-38.1 cm (10-15 in) outside the 30.48-cm (12-in) walkway width, or changes speed but loses balance but is able to recover and continue walking. (0) Severe impairment-Cannot change speeds, deviates greater than 38.1 cm (15 in) outside 30.48-cm (12-in) walkway width, or loses balance and  has to reach for wall or be caught.  _0_ 3. GAIT WITH HORIZONTAL HEAD TURNS Instructions: Walk from here to the next mark 6 m (20 ft) away. Begin walking at your normal pace. Keep walking straight; after 3 steps, turn your head to the right and keep walking straight while looking to the right. After 3 more steps, turn your  head to the left and keep walking straight while looking left. Continue alternating looking right and left every 3 steps until you have completed 2 repetitions in each direction. Grading: Loraine Leriche the highest category that applies. (3) Normal-Performs head turns smoothly with no change in gait. Deviates no more than 15.24 cm (6 in) outside 30.48-cm (12-in) walkway width. (2) Mild impairment-Performs head turns smoothly with slight change in gait velocity (eg, minor disruption to smooth gait path), deviates 15.24-25.4 cm (6-10 in) outside 30.48-cm (12-in) walkway width, or uses an assistive device.  (1) Moderate impairment-Performs head turns with moderate change in gait velocity, slows down, deviates 25.4-38.1 cm (10-15 in) outside 30.48-cm (12-in) walkway width but recovers, can continue to walk. (0) Severe impairment-Performs task with severe disruption of gait (eg, staggers 38.1 cm [15 in] outside 30.48-cm (12-in) walkway width, loses balance, stops, or reaches for wall).  0__ 4. GAIT WITH VERTICAL HEAD TURNS Instructions: Walk from here to the next mark (6 m [20 ft]). Begin walking at your normal pace. Keep walking straight; after 3 steps, tip your head up and keep walking straight while looking up. After 3 more steps, tip your head down, keep walking straight while looking down. Continue alternating looking up and down every 3 steps until you have completed 2 repetitions in each direction. Grading: Loraine Leriche the highest category that applies. (3) Normal-Performs head turns with no change in gait. Deviates no more than 15.24 cm (6 in) outside 30.48-cm (12-in) walkway width. (2) Mild impairment-Performs task with slight change in gait velocity (eg, minor disruption to smooth gait path), deviates 15.24-25.4 cm (6-10 in) outside 30.48-cm (12-in) walkway width or uses assistive device. (1) Moderate impairment-Performs task with moderate change in gait velocity, slows down, deviates 25.4-38.1 cm (10-15  in) outside 30.48-cm (12-in) walkway width but recovers, can continue to walk. (0) Severe impairment-Performs task with severe disruption of gait (eg, staggers 38.1 cm [15 in] outside 30.48-cm (12-in) walkway width, loses balance, stops, reaches for wall).  _0_ 5. GAIT AND PIVOT TURN Instructions: Begin with walking at your normal pace. When I tell you, "turn and stop," turn as quickly as you can to face the opposite direction and stop. Grading: Loraine Leriche the highest category that applies. (3) Normal-Pivot turns safely within 3 seconds and stops quickly with no loss of balance. (2) Mild impairment-Pivot turns safely in _3 seconds and stops with no loss of balance, or pivot turns safely within 3 seconds and stops with mild imbalance, requires small steps to catch balance. (1) Moderate impairment-Turns slowly, requires verbal cueing, or requires several small steps to catch balance following turn and stop. (0) Severe impairment-Cannot turn safely, requires assistance to turn and stop.  _0_ 6. STEP OVER OBSTACLE Instructions: Begin walking at your normal speed. When you come to the shoe box, step over it, not around it, and keep walking. Grading: Loraine Leriche the highest category that applies. (3) Normal-Is able to step over 2 stacked shoe boxes taped together (22.86 cm [9 in] total height) without changing gait speed; no evidence of imbalance. (2) Mild impairment-Is able to step over one shoe box (11.43 cm [4.5 in] total  height) without changing gait speed; no evidence of imbalance. (1) Moderate impairment-Is able to step over one shoe box (11.43 cm [4.5 in] total height) but must slow down and adjust steps to clear box safely. May require verbal cueing. (0) Severe impairment-Cannot perform without assistance.  _0_ 7. GAIT WITH NARROW BASE OF SUPPORT Instructions: Walk on the floor with arms folded across the chest, feet aligned heel to toe in tandem for a distance of 3.6 m [12 ft]. The number of steps taken  in a straight line are counted for a maximum of 10 steps. Grading: Loraine Leriche the highest category that applies. (3) Normal-Is able to ambulate for 10 steps heel to toe with no staggering. (2) Mild impairment-Ambulates 7-9 steps. (1) Moderate impairment-Ambulates 4-7 steps. (0) Severe impairment-Ambulates less than 4 steps heel to toe or cannot perform without assistance.  _0_ 8. GAIT WITH EYES CLOSED: 11.76 seconds Instructions: Walk at your normal speed from here to the next mark (6 m [20 ft]) with your eyes closed. Grading: Loraine Leriche the highest category that applies. (3) Normal-Walks 6 m (20 ft), no assistive devices, good speed, no evidence of imbalance, normal gait pattern, deviates no more than 15.24 cm (6 in) outside 30.48-cm (12-in) walkway width. Ambulates 6 m (20 ft) in less than 7 seconds. (2) Mild impairment-Walks 6 m (20 ft), uses assistive device, slower speed, mild gait deviations, deviates 15.24-25.4 cm (6-10 in) outside 30.48-cm (12-in) walkway width. Ambulates 6 m (20 ft) in less than 9 seconds but greater than 7 seconds. (1) Moderate impairment-Walks 6 m (20 ft), slow speed, abnormal gait pattern, evidence for imbalance, deviates 25.4-38.1 cm (10-15 in) outside 30.48-cm (12-in) walkway width. Requires more than 9 seconds to ambulate 6 m (20 ft). (0) Severe impairment-Cannot walk 6 m (20 ft) without assistance, severe gait deviations or imbalance, deviates greater than 38.1 cm (15 in) outside 30.48-cm (12-in) walkway width or will not attempt task.  _0_ 9. AMBULATING BACKWARDS Instructions: Walk backwards until I tell you to stop. Grading: Loraine Leriche the highest category that applies. (3) Normal-Walks 6 m (20 ft), no assistive devices, good speed, no evidence for imbalance, normal gait pattern, deviates no more than 15.24 cm (6 in) outside 30.48-cm (12-in) walkway width. (2) Mild impairment-Walks 6 m (20 ft), uses assistive device, slower speed, mild gait deviations, deviates 15.24-25.4 cm (6-10  in) outside 30.48-cm (12-in) walkway width. (1) Moderate impairment-Walks 6 m (20 ft), slow speed, abnormal gait pattern, evidence for imbalance, deviates 25.4-38.1 cm (10-15 in) outside 30.48-cm (12-in) walkway width. (0) Severe impairment-Cannot walk 6 m (20 ft) without assistance, severe gait deviations or imbalance, deviates greater than 38.1 cm (15 in) outside 30.48-cm (12-in) walkway width or will not attempt task.  _1_ 10. STEPS Instructions: Walk up these stairs as you would at home (ie, using the rail if necessary). At the top turn around and walk down. Grading: Loraine Leriche the highest category that applies. (3) Normal-Alternating feet, no rail. (2) Mild impairment-Alternating feet, must use rail. (1) Moderate impairment-Two feet to a stair; must use rail. (0) Severe impairment-Cannot do safely.  TOTAL SCORE: ___2___ /30 (MAXIMUM SCORE=30)  Scores of ? 22/30 on the FGA were found to be effective in predicting falls, Sensitivity 85%, Specificity 86% Scores of ? 20/30 on the FGA were optimal to predict older adults residing in community dwellings who would sustain unexplained falls in the next 6 months, Sensitivity 100%, Specificity 76% Alexander Berry & Alexander Berry, 2010; aged 7 to 80, Older Adults) MDC: 4.2 points for CVA (  Lin et al, 2010) MCID: 8 points for Balance and Vestibular Disorders Levander Campion and Juel Burrow, 2014)  Pt is tolerating higher level balance challenges and is able to tolerate more activity in between rest breaks, but continues to be high falls risk and demonstrate safety awareness & processing deficits. Continue per PT POC.   Therapy Documentation Precautions:  Precautions Precautions: Fall Precaution Comments: gangrenous left  toes (especially 3rd through 5th); monitor HR and O2; Requires frequent rest breaks during functional tasks Restrictions Weight Bearing Restrictions: No (pink sheet says WB as tolerated) Pain: Pain Assessment Pain Assessment: 0-10 Pain Score: 7  Pain Type:  Acute pain Pain Location: Toe (Comment which one) (3rd-5th) Pain Orientation: Left Pain Descriptors / Indicators: Burning Pain Onset: On-going Pain Intervention(s): Medication (See eMAR) Multiple Pain Sites: No   See FIM for current functional status  Therapy/Group: Individual Therapy  Aiman Noe M 01/27/2015, 8:40 AM

## 2015-01-27 NOTE — IPOC Note (Addendum)
Overall Plan of Care [facility]) Patient Details Name: Alexander Berry MRN: [id number] DOB: 06-12-1966  Admitting Diagnosis: Debility after cardiac arrest   Hospital Problems: Principal Problem:   Anoxic encephalopathy Active Problems:   Essential hypertension   Gangrene of toe   Debility     Functional Problem List: Nursing Behavior, Bladder, Bowel, Edema, Endurance, Medication Management, Motor, Nutrition, Pain, Perception, Safety, Skin Integrity  PT Balance, Endurance, Safety, Motor, Pain  OT Balance, Cognition, Endurance, Safety, Vision  SLP Cognition  TR         Basic ADL's: OT Grooming, Bathing, Dressing, Toileting     Advanced  ADL's: OT       Transfers: PT Bed Mobility, Bed to Chair, Car, [employer], Floor  OT Toilet, Research scientist (life sciences): PT Ambulation, Psychologist, prison and probation services, Stairs     Additional Impairments: OT None  SLP Communication, Social Cognition expression Problem Solving, Memory, Attention, Awareness  TR      Anticipated Outcomes Item Anticipated Outcome  Self Feeding Independent  Swallowing      Basic self-care  Mod I  Toileting  Mod I   Bathroom Transfers Supervision- mod I  Bowel/Bladder  Mod assist  Transfers  Mod I  Locomotion  Supervision  Communication  Mod I  Cognition  Supervision   Pain  <3 on a 0-10 scale  Safety/Judgment  mod assist   Therapy Plan: PT Intensity: Minimum of 1-2 x/day ,45 to 90 minutes PT Frequency: 5 out of 7 days PT Duration Estimated Length of Stay: 7-10 days OT Intensity: Minimum of 1-2 x/day, 45 to 90 minutes OT Frequency: 5 out of 7 days OT Duration/Estimated Length of Stay: 7-10 days SLP Intensity: Minumum of 1-2 x/day, 30 to 90 minutes SLP Frequency: 3 to 5 out of 7 days SLP Duration/Estimated Length of Stay: 7-10 days        Team Interventions: Nursing Interventions Patient/Family Education, Bladder Management, Bowel Management, Disease Management/Prevention, Pain Management, Medication  Management, Skin Care/Wound Management, Discharge Planning, Psychosocial Support  PT interventions Ambulation/gait training, Warden/ranger, Cognitive remediation/compensation, Community reintegration, Discharge planning, DME/adaptive equipment instruction, Functional mobility training, Neuromuscular re-education, Pain management, Patient/family education, Psychosocial support, Stair training, Therapeutic Activities, Therapeutic Exercise, UE/LE Strength taining/ROM, Wheelchair propulsion/positioning  OT Interventions Warden/ranger, Cognitive remediation/compensation, Discharge planning, Community reintegration, Fish farm manager, Functional mobility training, Patient/family education, Self Care/advanced ADL retraining, Therapeutic Activities, Therapeutic Exercise, UE/LE Strength taining/ROM  SLP Interventions Cognitive remediation/compensation, Financial trader, Environmental controls, Functional tasks, Internal/external aids, Patient/family education, Therapeutic Activities, Speech/Language facilitation  TR Interventions    SW/CM Interventions Discharge Planning, Psychosocial Support, Patient/Family Education    Team Discharge Planning: Destination: PT-Home ,OT- Home , SLP-Home Projected Follow-up: PT-Outpatient PT, 24 hour supervision/assistance, OT-  None, SLP-Other (comment) (Supervision with complex tasks) Projected Equipment Needs: PT-To be determined, OT- To be determined, SLP-None recommended by SLP Equipment Details: PT- , OT-  Patient/family involved in discharge planning: PT- Patient, Family member/caregiver,  OT-Patient, Family member/caregiver, SLP-Patient, Family member/caregiver  MD ELOS: 7d Medical Rehab Prognosis:  Good Assessment: 49 y.o. right handed male with history of hypertension. Independent prior to admission living with wife and family work in [employer]. Admitted 01/08/2015 after witnessed out of hospital cardiac arrest. He was  playing soccer when he suddenly collapsed. Bystander CPR was performed. EMS arrival patient was V. fib. He received epinephrine and amiodarone. He was intubated in the field. EKG showed global ST elevations. Underwent emergent cardiac catheterization with stenting and placed on Brilinta. Echocardiogram with ejection fraction of  60% and mild posterior wall hypokinesis. 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 Cranial CT scan negative. Hospital course respiratory failure as well as acute renal failure creatinine 2.98 with follow-up critical care medicine. Patient self extubated 01/12/2015. Developed persistent fevers suspect sepsis maintained on broad-spectrum antibodies with respiratory cultures 01/11/2015 showing MSSA pneumonia and course of Ancef has been completed. Blood cultures have been negative..    Now requiring 24/7 Rehab RN,MD, as well as CIR level PT, OT and SLP.  Treatment team will focus on ADLs and mobility with goals set at Mod I  See Team Conference Notes for weekly updates to the plan of care

## 2015-01-27 NOTE — Progress Notes (Signed)
Occupational Therapy Session Note  Patient Details  Name: Alexander Berry MRN: [id number] Date of Birth: [birth year]  Today's Date: 01/27/2015 OT Individual Time: 1100-1200 OT Individual Time Calculation (min): 60 min    Short Term Goals: Week 1:  OT Short Term Goal 1 (Week 1): LTG=STG  Skilled Therapeutic Interventions/Progress Updates: ADL-retraining with focus on improved safety awareness, attention, sequencing, dynamic standing balance, and improved postural control (sitting and standing).   Pt received from PT in rehab gym and ambulated back to his room with min guard assist for gait and mod vc to correct posture due to stooped posture (head and shoulders forward and down).   Also, per family pt's gait was narrow PTA but not as pronounced as it is now.    Pt required rest break at edge of bed after ambulating; HR 96, 02 sats 83%, but improved to 96% within 1 min with cue to correct posture and practice deep breathing.   Pt ambulated to shower with min guard and bathed sitting and standing, preferring to stand more this session under shower head versus using hand shower attachment.   Pt bathed unassisted however demo'd LOB 1 time but recovered with use of grab bar independently.   Pt dressed lower body sitting on BSC near shower and upper body seated in w/c at sink.   Pt stood to groom (shaving with electric and safety razors) and returned to w/c to rest.   OT re-educated pt on use of incentive spirometer noting pt's max capacity at only 750 ml during this session.   Pt advised to continue use of incentive spirometer to improved oxygen saturation and alertness.     Therapy Documentation Precautions:  Precautions Precautions: Fall Precaution Comments: gangrenous left  toes (especially 3rd through 5th); monitor HR and O2; Requires frequent rest breaks during functional tasks Restrictions Weight Bearing Restrictions: No (pink sheet says WB as tolerated)  Pain: Pain Assessment Pain Assessment:  0-10 Pain Score: 4  Pain Type: Acute pain Pain Location: Toe (Comment which one) (3rd-5th) Pain Orientation: Left Pain Descriptors / Indicators: Burning Pain Onset: On-going Pain Intervention(s): Medication (See eMAR) Multiple Pain Sites: No  See FIM for current functional status  Therapy/Group: Individual Therapy  Richardson Dubree 01/27/2015, 12:34 PM

## 2015-01-28 ENCOUNTER — Inpatient Hospital Stay (HOSPITAL_COMMUNITY): Payer: Self-pay | Admitting: Speech Pathology

## 2015-01-28 ENCOUNTER — Inpatient Hospital Stay (HOSPITAL_COMMUNITY): Payer: Self-pay

## 2015-01-28 ENCOUNTER — Inpatient Hospital Stay (HOSPITAL_COMMUNITY): Payer: MEDICAID | Admitting: Physical Therapy

## 2015-01-28 ENCOUNTER — Inpatient Hospital Stay (HOSPITAL_COMMUNITY): Payer: MEDICAID

## 2015-01-28 DIAGNOSIS — R5381 Other malaise: Secondary | ICD-10-CM | POA: Diagnosis present

## 2015-01-28 NOTE — Progress Notes (Signed)
[place] PHYSICAL MEDICINE & REHABILITATION     PROGRESS NOTE    Subjective/Complaints: Poor balance tends to fall backward, using "Balance Master" with OT Interpreter with patient ROS: Pt denies f, nausea, vomiting, abdominal pain, diarrhea, chest pain, shortness of breath, palpitations, dysuria, dizziness, neck or back pain, bleeding,   Objective: Vital Signs: Blood pressure 121/66, pulse 76, temperature 97.6 F (36.4 C), temperature source Tympanic, resp. rate 18, height 5\' 7"  (1.702 m), weight 85.5 kg (188 lb 7.9 oz), SpO2 100 %. No results found. No results for input(s): WBC, HGB, HCT, PLT in the last 72 hours. No results for input(s): NA, K, CL, GLUCOSE, BUN, CREATININE, CALCIUM in the last 72 hours.  Invalid input(s): CO CBG (last 3)   Recent Labs  01/25/15 2055  GLUCAP 124*    Wt Readings from Last 3 Encounters:  01/25/15 85.5 kg (188 lb 7.9 oz)  01/24/15 85.594 kg (188 lb 11.2 oz)    Physical Exam:   Constitutional: He appears well-developed. No distress HENT: dentition fair Head: Normocephalic.  Eyes: EOM are normal.  Neck: Normal range of motion. Neck supple. No thyromegaly present.  Cardiovascular: Normal rate and regular rhythm.  Respiratory: Effort normal and breath sounds normal. No respiratory distress.  GI: Soft. Bowel sounds are normal. He exhibits no distension.  Neurological: He is alert.  Limited English but can communicate fairly well. He was able to follow simple commands and answer simple yes no questions. UE 4/5 prox to distal. LE: 3-/5 hf, 3/5ke, 4/5 ankles, some pain limitations  Skin: Lateral three toes on left foot are starting to demarcate. He has pedal pulses with tips of toes significant ischemic changes, especially lateral 2. Toes remain tender to touch. Some bruising over the knees/shins Psychiatric: He has a normal mood and affect. His behavior is normal  Assessment/Plan: 1. 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 Physiatrist is providing close team supervision and 24 hour management of active medical problems listed below. Physiatrist and rehab team continue to assess barriers to discharge/monitor patient progress toward functional and medical goals. FIM: FIM - Bathing Bathing Steps Patient Completed: Chest, Right Arm, Left Arm, Abdomen, Front perineal area, Buttocks, Right upper leg, Left upper leg, Left lower leg (including foot), Right lower leg (including foot) Bathing: 5: Supervision: Safety issues/verbal cues  FIM - Upper Body Dressing/Undressing Upper body dressing/undressing steps patient completed: Thread/unthread right sleeve of pullover shirt/dresss, Thread/unthread left sleeve of pullover shirt/dress, Put head through opening of pull over shirt/dress, Pull shirt over trunk Upper body dressing/undressing: 5: Set-up assist to: Obtain clothing/put away FIM - Lower Body Dressing/Undressing Lower body dressing/undressing steps patient completed: Thread/unthread right underwear leg, Thread/unthread left underwear leg, Pull underwear up/down, Thread/unthread right pants leg, Thread/unthread left pants leg, Pull pants up/down, Fasten/unfasten pants, Don/Doff right shoe, Don/Doff left shoe Lower body dressing/undressing: 4: Min-Patient completed 75 plus % of tasks  FIM - Toileting Toileting steps completed by patient: Adjust clothing prior to toileting, Performs perineal hygiene Toileting Assistive Devices: Grab bar or rail for support Toileting: 5: Supervision: Safety issues/verbal cues  FIM - Diplomatic Services operational officer Devices: Grab bars Toilet Transfers: 3-From toilet/BSC: Mod A (lift or lower assist)  FIM - Banker Devices: Arm rests Bed/Chair Transfer: 4: Chair or W/C > Bed: Min  A (steadying Pt. > 75%)  FIM - Locomotion: Wheelchair Distance: 200 Locomotion: Wheelchair:  0: Activity did not occur FIM - Locomotion: Ambulation Locomotion: Ambulation Assistive Devices:  (no AD; HHA) Ambulation/Gait Assistance: 3: Mod assist Locomotion: Ambulation: 3: Travels 150 ft or more with moderate assistance (Pt: 50 - 74%)  Comprehension Comprehension Mode: Auditory Comprehension: 5-Understands complex 90% of the time/Cues < 10% of the time  Expression Expression Mode: Verbal Expression: 6-Expresses complex ideas: With extra time/assistive device  Social Interaction Social Interaction: 6-Interacts appropriately with others with medication or extra time (anti-anxiety, antidepressant).  Problem Solving Problem Solving: 3-Solves basic 50 - 74% of the time/requires cueing 25 - 49% of the time  Memory Memory: 5-Recognizes or recalls 90% of the time/requires cueing < 10% of the time  Medical Problem List and Plan: 1. Functional deficits secondary to debilitation after cardiac arrest status post stenting/potential anoxic encephalopathy 2. DVT Prophylaxis/Anticoagulation: Subcutaneous Lovenox. Monitor platelet counts of any signs of bleeding 3. Pain Management: Tylenol as needed 4. Acute renal insufficiency. Creatinine improved. Voiding/drinking well 5. Neuropsych: This patient is capable of making decisions on his own behalf. 6. Skin/Wound Care: Routine skin checks 7. Fluids/Electrolytes/Nutrition: encourage PO. Eating well 8. Gangrenous ischemic toes. Continued conservative care for now. Will need surgical intervention at some point. 9. MSSA pneumonia. Patient has completed course of Ancef 10. Hypertension. Norvasc 5 mg daily, Coreg 18.75 mg twice a day, lisinopril 10 mg daily, Aldactone 12.5 mg daily. May be able to back off these somewhat 11. Anemia: hgb improving to 9.5  LOS (Days) 4 A FACE TO FACE EVALUATION WAS PERFORMED  Erick Colace 01/28/2015 9:49 AM

## 2015-01-28 NOTE — Progress Notes (Signed)
Occupational Therapy Session Note  Patient Details  Name: Alexander Berry MRN: [id number] Date of Birth: [birth year]  Today's Date: 01/28/2015 OT Individual Time: 1000-1100 OT Individual Time Calculation (min): 60 min   Short Term Goals: Week 1:  OT Short Term Goal 1 (Week 1): LTG=STG  Skilled Therapeutic Interventions/Progress Updates: ADL-retraining at shower level with focus on improved postural control, dynamic standing balance, sequencing and attention.   With interpreter assist, pt requested shower and change of clothing.   After setup to place shower chair, adjust water temp and provide supplies, pt ambulated from w/c to bathroom with standby assist and bathed, sitting briefly and then standing in shower independently, sitting to focus attention and water flow to his left foot.   Pt now able to move toes (flex and extend partially) without pain.    Pt dressed seated after setup from his wife to gather clothing.   Pt groomed seated in w/c at sink ambulated to day room with OT for initial instruction on use of Wii Balance board and Wii Fit program to challenge dynamic standing balance.   Pt attentive and engaged during Wii activity with awareness of balance deficits as displayed during calibration of balance board.   Pt ambulated back to his room with min vc for posture, head up and shoulder protracted.     Therapy Documentation Precautions:  Precautions Precautions: Fall Precaution Comments: gangrenous left  toes (especially 3rd through 5th); monitor HR and O2; Requires frequent rest breaks during functional tasks Restrictions Weight Bearing Restrictions: No (pink sheet says WB as tolerated)  Vital Signs: Therapy Vitals Temp: 97.6 F (36.4 C) Temp Source: Oral Pulse Rate: 83 Resp: 18 BP: (!) 96/51 mmHg Patient Position (if appropriate): Sitting Oxygen Therapy SpO2: 98 % O2 Device: Not Delivered   Pain: Pain Assessment Pain Assessment: No/denies pain Multiple Pain Sites:  No  See FIM for current functional status  Therapy/Group: Individual Therapy   Second session: Time: 1400-1430 Time Calculation (min):  30 min  Pain Assessment: No/denies pain  Skilled Therapeutic Interventions: Therapeutic activity with focus on improved dynamic standing balance, balance training, using Wii Environmental health practitioner and Wii Fit training programs.   With daughter's assist to translate instruction pt completed 6 games: soccer X3, bubble river X2, and tilt table X1, with brief rest break seated after each session.   Pt was fully engaged in game play and advanced from "unbalanced" to amateur level during session.    Pt returned to his room at end of session with family present to assist.   See FIM for current functional status  Therapy/Group: Individual Therapy  Marianita Botkin 01/28/2015, 4:05 PM

## 2015-01-28 NOTE — Progress Notes (Signed)
Physical Therapy Session Note  Patient Details  Name: Arley Gaydos MRN: [id number] Date of Birth: 10-30-1965  Today's Date: 01/28/2015 PT Individual Time:  -  [phone] Treatment Time: 60 min     Short Term Goals: Week 1:  PT Short Term Goal 1 (Week 1): STGs = LTGs due to ELOS  Skilled Therapeutic Interventions/Progress Updates:    Gait Training: PT instructs pt in ambulation without AD x 150' x 2 reps req min A for balance and verbal cues for looking up and to squeeze stomach muscles to reduce lateral trunk sway.  PT instructs pt in ascending/descending corner flight of stairs with 1 handrail req min A for safety: step over step pattern 75% of time.   Neuromuscular Reeducation: PT instructs pt in core strengthening/motor control exercise on mat: bird-dog with alternating arm flexion, bird-dog with alternating leg extension, and bird-dog with contralateral arm flexion/leg extension: x 5-10 reps each side.  PT instructs pt in standing postural exercise with mirror visual feedback: wall angles 3 x 10 reps with verbal cues for technique (heels, butt, and shoulders touching wall).   Therapeutic Exercise: PT instructs pt in nu-step exercise at L4 x 10 minutes for cardiorespiratory endurance training.   Pt is tolerating increased difficulty in functional mobility tasks with less assist from PT. Continue per PT POC.   Therapy Documentation Precautions:  Precautions Precautions: Fall Precaution Comments: gangrenous left  toes (especially 3rd through 5th); monitor HR and O2; Requires frequent rest breaks during functional tasks Restrictions Weight Bearing Restrictions: No (pink sheet says WB as tolerated) Pain: Pain Assessment Pain Assessment: 0-10 Pain Score: 5  Pain Type: Acute pain Pain Location: Toe (Comment which one) (3rd-5th) Pain Orientation: Left Pain Descriptors / Indicators: Aching;Burning Pain Onset: On-going Pain Intervention(s): Rest Multiple Pain Sites: No  See FIM  for current functional status  Therapy/Group: Individual Therapy  Temesgen Weightman M 01/28/2015, 7:55 AM

## 2015-01-28 NOTE — Progress Notes (Signed)
Speech Language Pathology Daily Session Note  Patient Details  Name: Jadarius Kulish MRN: [id number] Date of Birth: May 18, 1966  Today's Date: 01/28/2015 SLP Individual Time: 2951-8841 SLP Individual Time Calculation (min): 45 min  Short Term Goals: Week 1: SLP Short Term Goal 1 (Week 1): Patient will utilize an increased vocal itensity at the phrase level with Mod I.  SLP Short Term Goal 2 (Week 1): Patient will demonstrate functional problem solving for basic and familiar tasks with supervision verbal and question cues.  SLP Short Term Goal 3 (Week 1): Patient will demonstrate selective attention in a mildly distracting enviornment for 60 minutes with supervision verbal cues.  SLP Short Term Goal 4 (Week 1): Patient will utilize external memory aids to recall new, daily information with supervision verbal cues.  SLP Short Term Goal 5 (Week 1): Patient will demonstrate anticipatory awareness in regards to d/c planning and identiyfing barriers/activities that will require assistance at discharge with supervision verbal questions.   Skilled Therapeutic Interventions: Skilled treatment session focused on speech and cognitive goals.  Interpreter was present for the duration of the session.  SLP facilitated session by providing Supervision-Mod Independence for increasing patient's vocal intensity with phrases and sentences.  He required min A to implement speech strategies to improve vocal intensity during conversation.  Per patient report, he noticed improvement when implementing speech strategies at the phrase-sentence level as compared to yesterday's session.  He required min A- Supervision with identifying a problem using picture cards displaying basic scenario and providing an appropriate solution.  Continue with current plan of care.   FIM:  Comprehension Comprehension Mode: Auditory Comprehension: 5-Understands complex 90% of the time/Cues < 10% of the time Expression Expression Mode:  Verbal Expression: 5-Expresses complex 90% of the time/cues < 10% of the time Social Interaction Social Interaction: 6-Interacts appropriately with others with medication or extra time (anti-anxiety, antidepressant). Problem Solving Problem Solving: 4-Solves basic 75 - 89% of the time/requires cueing 10 - 24% of the time Memory Memory: 5-Recognizes or recalls 90% of the time/requires cueing < 10% of the time  Pain Pain Assessment Pain Assessment: No/denies pain Pain Score: 5  Pain Type: Acute pain Pain Location: Toe (Comment which one) (3rd-5th) Pain Orientation: Left Pain Descriptors / Indicators: Aching;Burning Pain Onset: On-going Pain Intervention(s): Rest Multiple Pain Sites: No  Therapy/Group: Individual Therapy   Cranford Mon, MA CCC-SLP Cranford Mon A 01/28/2015, 12:48 PM

## 2015-01-29 ENCOUNTER — Inpatient Hospital Stay (HOSPITAL_COMMUNITY): Payer: MEDICAID | Admitting: Physical Therapy

## 2015-01-29 NOTE — Progress Notes (Signed)
Physical Therapy Session Note  Patient Details  Name: Alexander Berry MRN: [id number] Date of Birth: 1966-06-20  Today's Date: 01/29/2015 PT Individual Time: 5284-1324 PT Individual Time Calculation (min): 45 min   Short Term Goals: Week 1:  PT Short Term Goal 1 (Week 1): STGs = LTGs due to ELOS  Skilled Therapeutic Interventions/Progress Updates:  Pt was seen bedside in the am. Pt performed all sit to stand transfers with S. Pt ambulated with gym about 150 feet without assistive device and min guard to S, with step through gain and fluctuating base of support. In gym treatment focused on NMR, utilizing cone taps and criss cross cone taps, 3 sets x 10 reps each. Pt performed slalom with cones 50 feet x 2 with S and verbal cues. Pt performed side stepping 50 feet x 2 with S and verbal cues. Pt ascended/descended 4 stairs with no rails and min guard to min A. O2 sat monitored throughout treatment, O2 sat greater than 98% throughout. Pt returned to room with min guard to S. Pt left sitting up in w/c with family at bedside.   Therapy Documentation Precautions:  Precautions Precautions: Fall Precaution Comments: gangrenous left  toes (especially 3rd through 5th); monitor HR and O2; Requires frequent rest breaks during functional tasks Restrictions Weight Bearing Restrictions: No (pink sheet says WB as tolerated) General:   Vital Signs:   Pain: No c/o pain.    Locomotion : Ambulation Ambulation/Gait Assistance: 5: Supervision;4: Min guard   See FIM for current functional status  Therapy/Group: Individual Therapy  Rayford Halsted 01/29/2015, 12:35 PM

## 2015-01-30 ENCOUNTER — Inpatient Hospital Stay (HOSPITAL_COMMUNITY): Payer: MEDICAID | Admitting: Speech Pathology

## 2015-01-30 ENCOUNTER — Inpatient Hospital Stay (HOSPITAL_COMMUNITY): Payer: MEDICAID

## 2015-01-30 ENCOUNTER — Inpatient Hospital Stay (HOSPITAL_COMMUNITY): Payer: MEDICAID | Admitting: Physical Therapy

## 2015-01-30 MED ORDER — DOCUSATE SODIUM 100 MG PO CAPS
100.0000 mg | ORAL_CAPSULE | Freq: Two times a day (BID) | ORAL | Status: DC | PRN
  Filled 2015-01-30: qty 1

## 2015-01-30 NOTE — Progress Notes (Signed)
[facility] [place] PHYSICAL MEDICINE & REHABILITATION     PROGRESS NOTE    Subjective/Complaints: Pt feeling well. Foot feels better. Slept well. Happy with progress.   ROS: Pt denies f, nausea, vomiting, abdominal pain, diarrhea, chest pain, shortness of breath, palpitations, dysuria, dizziness, neck or back pain, bleeding,   Objective: Vital Signs: Blood pressure 113/68, pulse 73, temperature 98.3 F (36.8 C), temperature source Oral, resp. rate 18, height  (1.702 m), weight 85.5 kg (188 lb 7.9 oz), SpO2 100 %. No results found. No results for input(s): WBC, HGB, HCT, PLT in the last 72 hours. No results for input(s): NA, K, CL, GLUCOSE, BUN, CREATININE, CALCIUM in the last 72 hours.  Invalid input(s): CO CBG (last 3)  No results for input(s): GLUCAP in the last 72 hours.  Wt Readings from Last 3 Encounters:  01/25/15 85.5 kg (188 lb 7.9 oz)  01/24/15 85.594 kg (188 lb 11.2 oz)    Physical Exam:   Constitutional: He appears well-developed. No distress HENT: dentition fair Head: Normocephalic.  Eyes: EOM are normal.  Neck: Normal range of motion. Neck supple. No thyromegaly present.  Cardiovascular: Normal rate and regular rhythm.  Respiratory: Effort normal and breath sounds normal. No respiratory distress.  GI: Soft. Bowel sounds are normal. He exhibits no distension.  Neurological: He is alert.  Limited English but can communicate fairly well with me using mixture of english and spanish.  UE 4/5 prox to distal. LE: 3-/5 hf, 3/5ke, 4/5 ankles, some pain limitations  Skin: Lateral three toes on left foot are starting to demarcate--along pads of toes---overall looking better, dorsal aspects look quite viable..  Toes less tender to touch. Some bruising over the knees/shins Psychiatric: He has a normal mood and affect. His behavior is normal  Assessment/Plan: 1. 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 Physiatrist is providing close team supervision and 24 hour management of active medical problems listed below. Physiatrist and rehab team continue to assess barriers to discharge/monitor patient progress toward functional and medical goals. FIM: FIM - Bathing Bathing Steps Patient Completed: Chest, Right Arm, Left Arm, Abdomen, Front perineal area, Buttocks, Right upper leg, Left upper leg, Left lower leg (including foot), Right lower leg (including foot) Bathing: 5: Supervision: Safety issues/verbal cues  FIM - Upper Body Dressing/Undressing Upper body dressing/undressing steps patient completed: Thread/unthread right sleeve of pullover shirt/dresss, Thread/unthread left sleeve of pullover shirt/dress, Put head through opening of pull over shirt/dress, Pull shirt over trunk Upper body dressing/undressing: 5: Set-up assist to: Obtain clothing/put away FIM - Lower Body Dressing/Undressing Lower body dressing/undressing steps patient completed: Thread/unthread right underwear leg, Thread/unthread left underwear leg, Pull underwear up/down, Thread/unthread right pants leg, Thread/unthread left pants leg, Pull pants up/down, Fasten/unfasten pants, Don/Doff right shoe, Don/Doff left shoe Lower body dressing/undressing: 4: Min-Patient completed 75 plus % of tasks  FIM - Toileting Toileting steps completed by patient: Adjust clothing prior to toileting, Performs perineal hygiene Toileting Assistive Devices: Grab bar or rail for support Toileting: 5: Supervision: Safety issues/verbal cues  FIM - Diplomatic Services operational officer Devices: Therapist, music Transfers: 3-From toilet/BSC: Mod A (lift or lower assist)  FIM - Banker Devices: Arm rests Bed/Chair Transfer: 5: Supine > Sit: Supervision (verbal cues/safety issues), 6: Sit > Supine: No assist, 4: Chair  or W/C > Bed: Min A (steadying Pt. > 75%), 4: Bed > Chair  or W/C: Min A (steadying Pt. > 75%)  FIM - Locomotion: Wheelchair Distance: 200 Locomotion: Wheelchair: 0: Activity did not occur FIM - Locomotion: Ambulation Locomotion: Ambulation Assistive Devices: Other (comment) (no assistive device) Ambulation/Gait Assistance: 5: Supervision, 4: Min guard Locomotion: Ambulation: 4: Travels 150 ft or more with minimal assistance (Pt.>75%)  Comprehension Comprehension Mode: Auditory Comprehension: 5-Understands basic 90% of the time/requires cueing < 10% of the time  Expression Expression Mode: Verbal Expression: 5-Expresses basic 90% of the time/requires cueing < 10% of the time.  Social Interaction Social Interaction: 7-Interacts appropriately with others - No medications needed.  Problem Solving Problem Solving: 5-Solves basic 90% of the time/requires cueing < 10% of the time  Memory Memory: 5-Recognizes or recalls 90% of the time/requires cueing < 10% of the time  Medical Problem List and Plan: 1. Functional deficits secondary to debilitation after cardiac arrest status post stenting/potential anoxic encephalopathy 2. DVT Prophylaxis/Anticoagulation: Subcutaneous Lovenox. Monitor platelet counts of any signs of bleeding 3. Pain Management: Tylenol as needed 4. Acute renal insufficiency. Creatinine improved. Voiding/drinking well 5. Neuropsych: This patient is capable of making decisions on his own behalf. 6. Skin/Wound Care: Routine skin checks 7. Fluids/Electrolytes/Nutrition: encourage PO. Eating well 8. Gangrenous ischemic toes. Continued conservative care for now. Showing continued progress but will likely need surgical debridement after dc 9. MSSA pneumonia. Patient has completed course of Ancef 10. Hypertension. Norvasc 5 mg daily, Coreg 18.75 mg twice a day, lisinopril 10 mg daily, Aldactone 12.5 mg daily. BP's continue to run low: Consider decreasing lisinopril or stopping  although he's not symptomatic 11. Anemia: hgb improving to 9.5  LOS (Days) 6 A FACE TO FACE EVALUATION WAS PERFORMED  SWARTZ,ZACHARY T 01/30/2015 8:04 AM

## 2015-01-30 NOTE — Progress Notes (Signed)
Occupational Therapy Session Note  Patient Details  Name: Alexander Berry MRN: [id number] Date of Birth: 11/02/1965  Today's Date: 01/30/2015 OT Individual Time: 0900-1000 OT Individual Time Calculation (min): 60 min   Short Term Goals: Week 1:  OT Short Term Goal 1 (Week 1): LTG=STG  Skilled Therapeutic Interventions/Progress Updates: ADL-retraining with focus on safety awareness, endurance, dynamic standing balance, pt/family ed on supervised transfer to/from bathroom.   Pt received sitting on toilet during prolonged and difficult BM.   Pt requests relay of symptoms to RN with interest in use of stool softener.      Pt completes clothing management and hygiene unassisted and ambulates to shower for bathing, standing during task and only using chair to sit to aim and focus water stream on left foot near necrotic toes.   Pt dressed sitting on BSC after showering and grooms at sink.    Pt and family were then escorted to day room for initial training on balance exercises as HEP.   OT provided demo and min guard assist as pt performed forward lunge exercises, 20 reps, and unilateral forward lean, 5 reps, until end of session.   Written HEP provided with qualification that pt not attempt exercises unassisted d/t generalized weakness and mild balance impairment.     Therapy Documentation Precautions:  Precautions Precautions: Fall Precaution Comments: gangrenous left  toes (especially 3rd through 5th); monitor HR and O2; Requires frequent rest breaks during functional tasks Restrictions Weight Bearing Restrictions: No (pink sheet says WB as tolerated)  Pain: Pain Assessment Pain Assessment: No/denies pain  See FIM for current functional status  Therapy/Group: Individual Therapy   Second session: Time: 1130-1200 Time Calculation (min):  30 min  Pain Assessment: No/denies pain  Skilled Therapeutic Interventions: Therapeutic activity with focus on dynamic standing balance activities using  Wii Fit program with Wii Balance board.   Including re-assessment of balance, pt participated in 7 game sessions to include soccer (X4), and tight rope balance (X3).   Although familiar with Wii system, pt demo's mild impairment following direction and intent of activity to shift his weight through his legs versus reacting to visual cues and distractions with head movements.      Family attends sessions and requests permission to test their Wii balance board using Wii console at CIR.      See FIM for current functional status  Therapy/Group: Individual Therapy  Jodi Criscuolo 01/30/2015, 10:19 AM

## 2015-01-30 NOTE — Progress Notes (Signed)
Physical Therapy Session Note  Patient Details  Name: Alexander Berry MRN: [id number] Date of Birth: 07/07/1966  Today's Date: 01/30/2015 PT Individual Time: [id number] and [id number] PT Individual Time Calculation (min): 56 min and 23 min  Short Term Goals: Week 1:  PT Short Term Goal 1 (Week 1): STGs = LTGs due to ELOS  Skilled Therapeutic Interventions/Progress Updates:   Pt received in room with wife and daughter present.  No reports of pain except in toes but premedicated.  Performed gait in controlled environment x 150' x 3 with close supervision with pt recognizing obstacles and safety hazards (pt beds and other equipment in hallway) and appropriately navigating them.  In gym performed re-assessment of BERG, 5x sit to stand, and FGA; see below for results and progress.  Discussed progress with pt and family and possible D/C in a couple of days due to fast progress.  Also discussed continued falls risk with more dynamic gait challenges.  Educated pt on and demonstrated how to perform floor>furniture transfer with pt giving return demonstration with supervision.  Also reviewed simulated car transfer and pt performed with supervision and recalled previous therapist's safety concerns a week ago.  Returned to room ambulating with supervision and left with SLP for session.     PM session: Pt received in w/c with family present.  Pt ambulated x 150' to gym with supervision still with decreased weight shift and WB through LLE; pt reports pain in L foot/toes but tolerable.  In gym performed stair negotiation training with pt up/down 4 stairs x 3 reps with no rails with pt performing with alternating sequence to ascend, step to sequence to descend with min A for balance; had patient's daughter return demonstrate providing min A on stairs for home entry/exit.  Performed balance training with soccer drills with pt performing alternating foot taps to ball and then performing dribbling of soccer ball down hallway  x 100' x 2 increasing speed.  Pt required min A and two seated rest breaks due to fatigue but maintained HR of 80 bpm.  Returned to room and pt left in w/c with family present.    Therapy Documentation Precautions:  Precautions Precautions: Fall Precaution Comments: gangrenous left  toes (especially 3rd through 5th); monitor HR and O2; Requires frequent rest breaks during functional tasks Restrictions Weight Bearing Restrictions: No (pink sheet says WB as tolerated) Vital Signs: Therapy Vitals Pulse Rate: 71 Oxygen Therapy SpO2: 99 % O2 Device: Not Delivered Pain: Pain Assessment Pain Assessment: No/denies pain Locomotion : Ambulation Ambulation/Gait Assistance: 5: Supervision  Balance: Berg Balance Test Sit to Stand: Able to stand without using hands and stabilize independently Standing Unsupported: Able to stand safely 2 minutes Sitting with Back Unsupported but Feet Supported on Floor or Stool: Able to sit safely and securely 2 minutes Stand to Sit: Sits safely with minimal use of hands Transfers: Able to transfer safely, minor use of hands Standing Unsupported with Eyes Closed: Able to stand 10 seconds safely Standing Ubsupported with Feet Together: Able to place feet together independently and stand for 1 minute with supervision From Standing, Reach Forward with Outstretched Arm: Can reach confidently >25 cm (10") From Standing Position, Pick up Object from Floor: Able to pick up shoe safely and easily From Standing Position, Turn to Look Behind Over each Shoulder: Looks behind from both sides and weight shifts well Turn 360 Degrees: Able to turn 360 degrees safely in 4 seconds or less Standing Unsupported, Alternately Place Feet on Step/Stool: Able to stand  independently and safely and complete 8 steps in 20 seconds Standing Unsupported, One Foot in Front: Able to place foot tandem independently and hold 30 seconds Standing on One Leg: Able to lift leg independently and hold  > 10 seconds Total Score: 55  Patient demonstrates increased fall risk as noted by score of 55/56 on Berg Balance Scale.  (<36= high risk for falls, close to 100%; 37-45 significant >80%; 46-51 moderate >50%; 52-55 lower >25%)  Five times Sit to Stand Test (FTSS) Method: Use a straight back chair with a solid seat that is 16-18" high. Ask participant to sit on the chair with arms folded across their chest.   Instructions: "Stand up and sit down as quickly as possible 5 times, keeping your arms folded across your chest."   Measurement: Stop timing when the participant stands the 5th time.  TIME: _10_____ (in seconds)  Times > 13.6 seconds is associated with increased disability and morbidity (Guralnik, 2000) Times > 15 seconds is predictive of recurrent falls in healthy individuals aged 35 and older (Buatois, et al., 2008) Normal performance values in community dwelling individuals aged 24 and older (Bohannon, 2006): o 60-69 years: 11.4 seconds o 70-79 years: 12.6 seconds o 80-89 years: 14.8 seconds  Functional Gait Assessment (FGA) Requirements: A marked 6-m (20-ft) walkway that is marked with a 30.48-cm (12-in) width.  _1_ 1. GAIT LEVEL SURFACE Instructions: Walk at your normal speed from here to the next mark (6 m[20 ft]). Grading: Loraine Leriche the highest category that applies. (3) Normal-Walks 6 m (20 ft) in less than 5.5 seconds, no assistive devices, good speed, no evidence for imbalance, normal gait pattern, deviates no more than 15.24 cm (6 in) outside of the 30.48-cm (12-in) walkway width. (2) Mild impairment-Walks 6 m (20 ft) in less than 7 seconds but greater than 5.5 seconds, uses assistive device, slower speed, mild gait deviations, or deviates 15.24-25.4 cm (6-10 in) outside of the 30.48-cm (12-in) walkway width. (1) Moderate impairment-Walks 6 m (20 ft), slow speed, abnormal gait pattern, evidence for imbalance, or deviates 25.4-38.1 cm (10-15 in) outside of the 30.48-cm (12-in)  walkway width. Requires more than 7 seconds to ambulate 6 m (20 ft). (0) Severe impairment-Cannot walk 6 m (20 ft) without assistance,severe gait deviations or imbalance, deviates greater than 38.1 cm (15 in) outside of the 30.48-cm (12-in) walkway width or reaches and touches the wall.  _1_ 2. CHANGE IN GAIT SPEED Instructions: Begin walking at your normal pace (for 1.5 m [5 ft]). When I tell you "go," walk as fast as you can (for 1.5 m [5 ft]). When I tell you "slow," walk as slowly as you can (for 1.5 m [5 ft]). Grading: Loraine Leriche the highest category that applies. (3) Normal-Able to smoothly change walking speed without loss of balance or gait deviation. Shows a significant difference in walking speeds between normal, fast, and slow speeds. Deviates no more than 15.24 cm (6 in) outside of the 30.48-cm (12-in) walkway width. (2) Mild impairment-Is able to change speed but demonstrates mild gait deviations, deviates 15.24-25.4 cm (6-10 in) outside of the 30.48-cm (12-in) walkway width, or no gait deviations but unable to achieve a significant change in velocity, or uses an assistive device. (1) Moderate impairment-Makes only minor adjustments to walking speed, or accomplishes a change in speed with significant gait deviations, deviates 25.4-38.1 cm (10-15 in) outside the 30.48-cm (12-in) walkway width, or changes speed but loses balance but is able to recover and continue walking. (0) Severe impairment-Cannot change  speeds, deviates greater than 38.1 cm (15 in) outside 30.48-cm (12-in) walkway width, or loses balance and has to reach for wall or be caught.  _2_ 3. GAIT WITH HORIZONTAL HEAD TURNS Instructions: Walk from here to the next mark 6 m (20 ft) away. Begin walking at your normal pace. Keep walking straight; after 3 steps, turn your head to the right and keep walking straight while looking to the right. After 3 more steps, turn your head to the left and keep walking straight while looking left.  Continue alternating looking right and left every 3 steps until you have completed 2 repetitions in each direction. Grading: Loraine Leriche the highest category that applies. (3) Normal-Performs head turns smoothly with no change in gait. Deviates no more than 15.24 cm (6 in) outside 30.48-cm (12-in) walkway width. (2) Mild impairment-Performs head turns smoothly with slight change in gait velocity (eg, minor disruption to smooth gait path), deviates 15.24-25.4 cm (6-10 in) outside 30.48-cm (12-in) walkway width, or uses an assistive device.  (1) Moderate impairment-Performs head turns with moderate change in gait velocity, slows down, deviates 25.4-38.1 cm (10-15 in) outside 30.48-cm (12-in) walkway width but recovers, can continue to walk. (0) Severe impairment-Performs task with severe disruption of gait (eg, staggers 38.1 cm [15 in] outside 30.48-cm (12-in) walkway width, loses balance, stops, or reaches for wall).  _2_ 4. GAIT WITH VERTICAL HEAD TURNS Instructions: Walk from here to the next mark (6 m [20 ft]). Begin walking at your normal pace. Keep walking straight; after 3 steps, tip your head up and keep walking straight while looking up. After 3 more steps, tip your head down, keep walking straight while looking down. Continue alternating looking up and down every 3 steps until you have completed 2 repetitions in each direction. Grading: Loraine Leriche the highest category that applies. (3) Normal-Performs head turns with no change in gait. Deviates no more than 15.24 cm (6 in) outside 30.48-cm (12-in) walkway width. (2) Mild impairment-Performs task with slight change in gait velocity (eg, minor disruption to smooth gait path), deviates 15.24-25.4 cm (6-10 in) outside 30.48-cm (12-in) walkway width or uses assistive device. (1) Moderate impairment-Performs task with moderate change in gait velocity, slows down, deviates 25.4-38.1 cm (10-15 in) outside 30.48-cm (12-in) walkway width but recovers, can continue to  walk. (0) Severe impairment-Performs task with severe disruption of gait (eg, staggers 38.1 cm [15 in] outside 30.48-cm (12-in) walkway width, loses balance, stops, reaches for wall).  _3_ 5. GAIT AND PIVOT TURN Instructions: Begin with walking at your normal pace. When I tell you, "turn and stop," turn as quickly as you can to face the opposite direction and stop. Grading: Loraine Leriche the highest category that applies. (3) Normal-Pivot turns safely within 3 seconds and stops quickly with no loss of balance. (2) Mild impairment-Pivot turns safely in _3 seconds and stops with no loss of balance, or pivot turns safely within 3 seconds and stops with mild imbalance, requires small steps to catch balance. (1) Moderate impairment-Turns slowly, requires verbal cueing, or requires several small steps to catch balance following turn and stop. (0) Severe impairment-Cannot turn safely, requires assistance to turn and stop.  _1_ 6. STEP OVER OBSTACLE Instructions: Begin walking at your normal speed. When you come to the shoe box, step over it, not around it, and keep walking. Grading: Loraine Leriche the highest category that applies. (3) Normal-Is able to step over 2 stacked shoe boxes taped together (22.86 cm [9 in] total height) without changing gait speed; no evidence  of imbalance. (2) Mild impairment-Is able to step over one shoe box (11.43 cm [4.5 in] total height) without changing gait speed; no evidence of imbalance. (1) Moderate impairment-Is able to step over one shoe box (11.43 cm [4.5 in] total height) but must slow down and adjust steps to clear box safely. May require verbal cueing. (0) Severe impairment-Cannot perform without assistance.  _2_ 7. GAIT WITH NARROW BASE OF SUPPORT Instructions: Walk on the floor with arms folded across the chest, feet aligned heel to toe in tandem for a distance of 3.6 m [12 ft]. The number of steps taken in a straight line are counted for a maximum of 10 steps. Grading: Loraine Leriche  the highest category that applies. (3) Normal-Is able to ambulate for 10 steps heel to toe with no staggering. (2) Mild impairment-Ambulates 7-9 steps. (1) Moderate impairment-Ambulates 4-7 steps. (0) Severe impairment-Ambulates less than 4 steps heel to toe or cannot perform without assistance.  _1_ 8. GAIT WITH EYES CLOSED Instructions: Walk at your normal speed from here to the next mark (6 m [20 ft]) with your eyes closed. Grading: Loraine Leriche the highest category that applies. (3) Normal-Walks 6 m (20 ft), no assistive devices, good speed, no evidence of imbalance, normal gait pattern, deviates no more than 15.24 cm (6 in) outside 30.48-cm (12-in) walkway width. Ambulates 6 m (20 ft) in less than 7 seconds. (2) Mild impairment-Walks 6 m (20 ft), uses assistive device, slower speed, mild gait deviations, deviates 15.24-25.4 cm (6-10 in) outside 30.48-cm (12-in) walkway width. Ambulates 6 m (20 ft) in less than 9 seconds but greater than 7 seconds. (1) Moderate impairment-Walks 6 m (20 ft), slow speed, abnormal gait pattern, evidence for imbalance, deviates 25.4-38.1 cm (10-15 in) outside 30.48-cm (12-in) walkway width. Requires more than 9 seconds to ambulate 6 m (20 ft). (0) Severe impairment-Cannot walk 6 m (20 ft) without assistance, severe gait deviations or imbalance, deviates greater than 38.1 cm (15 in) outside 30.48-cm (12-in) walkway width or will not attempt task.  _1_ 9. AMBULATING BACKWARDS Instructions: Walk backwards until I tell you to stop. Grading: Loraine Leriche the highest category that applies. (3) Normal-Walks 6 m (20 ft), no assistive devices, good speed, no evidence for imbalance, normal gait pattern, deviates no more than 15.24 cm (6 in) outside 30.48-cm (12-in) walkway width. (2) Mild impairment-Walks 6 m (20 ft), uses assistive device, slower speed, mild gait deviations, deviates 15.24-25.4 cm (6-10 in) outside 30.48-cm (12-in) walkway width. (1) Moderate impairment-Walks 6 m (20 ft),  slow speed, abnormal gait pattern, evidence for imbalance, deviates 25.4-38.1 cm (10-15 in) outside 30.48-cm (12-in) walkway width. (0) Severe impairment-Cannot walk 6 m (20 ft) without assistance, severe gait deviations or imbalance, deviates greater than 38.1 cm (15 in) outside 30.48-cm (12-in) walkway width or will not attempt task.  _1_ 10. STEPS Instructions: Walk up these stairs as you would at home (ie, using the rail if necessary). At the top turn around and walk down. Grading: Loraine Leriche the highest category that applies. (3) Normal-Alternating feet, no rail. (2) Mild impairment-Alternating feet, must use rail. (1) Moderate impairment-Two feet to a stair; must use rail. (0) Severe impairment-Cannot do safely.  TOTAL SCORE: __15____ /30 (MAXIMUM SCORE=30)  Scores of ? 22/30 on the FGA were found to be effective in predicting falls, Sensitivity 85%, Specificity 86% Scores of ? 20/30 on the FGA were optimal to predict older adults residing in community dwellings who would sustain unexplained falls in the next 6 months, Sensitivity 100%, Specificity 76% (  Alvino Chapel & Lucianne Muss, 2010; aged 67 to 42, Older Adults) MDC: 4.2 points for CVA Juel Burrow et al, 2010) MCID: 8 points for Balance and Vestibular Disorders Levander Campion and Juel Burrow, 2014)  See FIM for current functional status  Therapy/Group: Individual Therapy  Edman Circle [facility] 01/30/2015, 12:17 PM

## 2015-01-30 NOTE — Patient Instructions (Addendum)
Therapeutic - Forward Lunge   Lleve una pierna hacia adelante con ambas rodillas dobladas, y pase el peso del cuerpo hacia el taln del pie de adelante. Repita ____ veces. Repita con la otra pierna.  Copyright  VHI. All rights reserved.  Stretching: Quadriceps (Standing)   Lleve le taln derecho hacia el glteo hasta sentir un estiramiento en la parte anterior del muslo. Sostenga ____ segundos. Repita ____ veces por rutina. Realice ____ rutinas por sesin. Realice ____ sesiones por da.  [email or web address]   Copyright  VHI. All rights reserved.  Balance: Unilateral - Forward [employer]Lean   De pie, apoyado sobre el pie izquierdo, manos en la cintura. Manteniendo la pelvis a nivel, inclnese hacia adelante como si quisiera tocar la frente contra una pared. Sostenga ____ segundos. Descanse. Repita ____ veces por rutina. Realice ____ rutinas por sesin. Realice ____ sesiones por da.  [email or web address]   Copyright  VHI. All rights reserved.  Balance: Three-Way Leg Swing   De pie sobre la pierna izquierda, manos en la cintura. Lleve la otra pierna hacia adelante ____ veces, hacia el lado ____ veces, hacia atrs ____ veces. Sostenga en cada posicin ____ segundos. Descanse. Repita ____ veces por rutina. Realice ____ rutinas por sesin. Realice ____ sesiones por da.  [email or web address]://[email or web address]   Copyright  VHI. All rights reserved.  Balance / Reach   Liberty GlobalDe pie, sobre el pie izquierdo sostenga una pesa de ____ Rebeca Alertlibras en la mano opuesta. Doble la rodilla, inclinando el cuerpo como si quisiera alcanzar el lado opuesto. Sostenga ____ segundos. Descanse. Repita ____ veces por rutina. Realice ____ rutinas por sesin. Realice ____ sesiones por da.  [email or web address]://[email or web address]   Copyright  VHI. All rights reserved.  Stretching: Quadriceps (Standing)   Lleve le taln derecho hacia el glteo hasta sentir un estiramiento en la parte anterior del muslo. Sostenga ____ segundos. Repita ____  veces por rutina. Realice ____ rutinas por sesin. Realice ____ sesiones por da.  [email or web address]://[email or web address]   Copyright  VHI. All rights reserved.  Soccer Throw (Half-Kneeling)   Kneel on one knee, holding __ pound ball behind head. Throw ball forward to partner. Catch ball as it returns. Repeat __ times per set. Rest __ seconds after set. Do __ sets per session.  Copyright  VHI. All rights reserved.  Soccer Throw / Lunge (Standing)   Stand, holding __ pound ball behind head. Lunge step forward, throwing ball to partner. Catch ball as it returns. Repeat __ times per set. Rest __ seconds after set. Do __ sets per session.  Copyright  VHI. All rights reserved.

## 2015-01-30 NOTE — Plan of Care (Signed)
Problem: RH Wheelchair Mobility Goal: LTG Patient will propel w/c in controlled environment (PT) LTG: Patient will propel wheelchair in controlled environment, # of feet with assist (PT)  Outcome: Not Applicable Date Met:  02/72/53 D/C 01/30/15 Goal: LTG Patient will propel w/c in home environment (PT) LTG: Patient will propel wheelchair in home environment, # of feet with assistance (PT).  Outcome: Not Applicable Date Met:  66/44/03 D/C 01/30/15

## 2015-01-30 NOTE — Progress Notes (Signed)
Speech Language Pathology Daily Session Note  Patient Details  Name: Alexander Berry MRN: [id number] Date of Birth: 04/14/1966  Today's Date: 01/30/2015 SLP Individual Time: 1100-1130 SLP Individual Time Calculation (min): 30 min  Short Term Goals: Week 1: SLP Short Term Goal 1 (Week 1): Patient will utilize an increased vocal itensity at the phrase level with Mod I.  SLP Short Term Goal 2 (Week 1): Patient will demonstrate functional problem solving for basic and familiar tasks with supervision verbal and question cues.  SLP Short Term Goal 3 (Week 1): Patient will demonstrate selective attention in a mildly distracting enviornment for 60 minutes with supervision verbal cues.  SLP Short Term Goal 4 (Week 1): Patient will utilize external memory aids to recall new, daily information with supervision verbal cues.  SLP Short Term Goal 5 (Week 1): Patient will demonstrate anticipatory awareness in regards to d/c planning and identiyfing barriers/activities that will require assistance at discharge with supervision verbal questions.   Skilled Therapeutic Interventions: Skilled treatment session focused on addressing speech and cognitive goals.  Interpreter was present for the duration of the session, as well as patient's daughter.  SLP facilitated session by providing initial cue for achieving increased vocal intensity with breath support; however, patient was then Mod I for speech intelligibly at the phrase to sentence level during a structured task.  Selective attention was functional for today's 30 minute session with SLP assistance and he verbalized adequate awareness and safety during a discussion regarding dischrage planning. He required Supervision question cues to carryover speech strategies at the conversational level.  SLP provided patient with a list of 3 locations/items to locate on the way back to his room; he required Supervision cues for initial storage of information but was then Mod I for  recall during task.  Continue with current plan of care.   FIM:  Comprehension Comprehension Mode: Auditory Comprehension: 5-Follows basic conversation/direction: With no assist Expression Expression Mode: Verbal Expression: 5-Expresses complex 90% of the time/cues < 10% of the time Social Interaction Social Interaction: 7-Interacts appropriately with others - No medications needed. Problem Solving Problem Solving: 5-Solves basic problems: With no assist Memory Memory: 5-Recognizes or recalls 90% of the time/requires cueing < 10% of the time FIM - Eating Eating Activity: 7: Complete independence:no helper  Pain Pain Assessment Pain Assessment: No/denies pain  Therapy/Group: Individual Therapy  Alexander FerrettiMelissa Guinn Berry, M.A., CCC-SLP [phone]  Alexander Berry 01/30/2015, 11:45 AM

## 2015-01-30 NOTE — Plan of Care (Signed)
Problem: RH Floor Transfers Goal: LTG Patient will perform floor transfers w/assist (PT) LTG: Patient will perform floor transfers with assistance (PT). Goal added 01/30/15  Problem: RH Ambulation Goal: LTG Patient will ambulate in community environment (PT) LTG: Patient will ambulate in community environment, # of feet with assistance (PT). Goal added 01/30/15

## 2015-01-31 ENCOUNTER — Inpatient Hospital Stay (HOSPITAL_COMMUNITY): Payer: MEDICAID

## 2015-01-31 ENCOUNTER — Inpatient Hospital Stay (HOSPITAL_COMMUNITY): Payer: MEDICAID | Admitting: Speech Pathology

## 2015-01-31 ENCOUNTER — Inpatient Hospital Stay (HOSPITAL_COMMUNITY): Payer: MEDICAID | Admitting: Physical Therapy

## 2015-01-31 LAB — CREATININE, SERUM
Creatinine, Ser: 1.09 mg/dL (ref 0.61–1.24)
GFR calc Af Amer: >60 mL/min (ref >60)
GFR calc non Af Amer: >60 mL/min (ref >60)

## 2015-01-31 NOTE — Progress Notes (Signed)
[facility]     PROGRESS NOTE    Subjective/Complaints: A little chilly in room. Denies pain. Had a good night.   ROS: Pt denies f, nausea, vomiting, abdominal pain, diarrhea, chest pain, shortness of breath, palpitations, dysuria, dizziness, neck or back pain, bleeding,   Objective: Vital Signs: Blood pressure 106/68, pulse 67, temperature 98.4 F (36.9 C), temperature source Oral, resp. rate 18, height 5\' 7"  (1.702 m), weight 85.5 kg (188 lb 7.9 oz), SpO2 96 %. No results found. No results for input(s): WBC, HGB, HCT, PLT in the last 72 hours. No results for input(s): NA, K, CL, GLUCOSE, BUN, CREATININE, CALCIUM in the last 72 hours.  Invalid input(s): CO CBG (last 3)  No results for input(s): GLUCAP in the last 72 hours.  Wt Readings from Last 3 Encounters:  01/25/15 85.5 kg (188 lb 7.9 oz)  01/24/15 85.594 kg (188 lb 11.2 oz)    Physical Exam:   Constitutional: He appears well-developed. No distress HENT: dentition fair Head: Normocephalic.  Eyes: EOM are normal.  Neck: Normal range of motion. Neck supple. No thyromegaly present.  Cardiovascular: Normal rate and regular rhythm.  Respiratory: Effort normal and breath sounds normal. No respiratory distress.  GI: Soft. Bowel sounds are normal. He exhibits no distension.  Neurological: He is alert.  Limited English but can communicate fairly well with me using mixture of english and spanish.  UE 4/5 prox to distal. LE: 3-/5 hf, 3/5ke, 4/5 ankles, some pain limitations  Skin: Lateral three toes on left foot are  demarcated--along pads of toes---overall looking better, dorsal aspects look quite viable..  Toes less tender to touch. Some bruising over the knees/shins which is healing Psychiatric: He has a normal mood and affect. His behavior is normal  Assessment/Plan: 1. 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 Physiatrist is providing close team supervision and 24 hour management of active medical problems listed below. Physiatrist and rehab team continue to assess barriers to discharge/monitor patient progress toward functional and medical goals. FIM: FIM - Bathing Bathing Steps Patient Completed: Chest, Right Arm, Left Arm, Abdomen, Front perineal area, Buttocks, Right upper leg, Left upper leg, Right lower leg (including foot), Left lower leg (including foot) Bathing: 5: Supervision: Safety issues/verbal cues  FIM - Upper Body Dressing/Undressing Upper body dressing/undressing steps patient completed: Thread/unthread right sleeve of pullover shirt/dresss, Thread/unthread left sleeve of pullover shirt/dress, Put head through opening of pull over shirt/dress, Pull shirt over trunk Upper body dressing/undressing: 5: Set-up assist to: Obtain clothing/put away FIM - Lower Body Dressing/Undressing Lower body dressing/undressing steps patient completed: Thread/unthread right underwear leg, Thread/unthread left underwear leg, Pull underwear up/down, Thread/unthread right pants leg, Thread/unthread left pants leg, Pull pants up/down, Fasten/unfasten pants, Don/Doff right sock, Don/Doff left sock, Don/Doff right shoe, Don/Doff left shoe, Fasten/unfasten right shoe, Fasten/unfasten left shoe Lower body dressing/undressing: 5: Set-up assist to: Obtain clothing  FIM - Toileting Toileting steps completed by patient: Adjust clothing prior to toileting, Performs perineal hygiene, Adjust clothing after toileting Toileting Assistive Devices: Grab bar or rail for support Toileting: 5: Supervision: Safety issues/verbal cues  FIM - Diplomatic Services operational officerToilet Transfers Toilet Transfers Assistive Devices: Grab bars Toilet Transfers: 5-To toilet/BSC: Supervision (verbal cues/safety issues), 5-From toilet/BSC: Supervision (verbal cues/safety issues)  FIM -  BankerBed/Chair Transfer Bed/Chair Transfer Assistive Devices: Arm rests Bed/Chair Transfer: 5: Bed > Chair or W/C: Supervision (verbal cues/safety issues),  5: Chair or W/C > Bed: Supervision (verbal cues/safety issues)  FIM - Locomotion: Wheelchair Distance: 200 Locomotion: Wheelchair: 0: Activity did not occur FIM - Locomotion: Ambulation Locomotion: Ambulation Assistive Devices: Other (comment) (no assistive device) Ambulation/Gait Assistance: 5: Supervision Locomotion: Ambulation: 5: Travels 150 ft or more with supervision/safety issues  Comprehension Comprehension Mode: Auditory Comprehension: 5-Follows basic conversation/direction: With no assist  Expression Expression Mode: Verbal Expression: 5-Expresses basic needs/ideas: With no assist  Social Interaction Social Interaction: 6-Interacts appropriately with others with medication or extra time (anti-anxiety, antidepressant).  Problem Solving Problem Solving: 5-Solves complex 90% of the time/cues < 10% of the time  Memory Memory: 5-Recognizes or recalls 90% of the time/requires cueing < 10% of the time  Medical Problem List and Plan: 1. Functional deficits secondary to debilitation after cardiac arrest status post stenting/potential anoxic encephalopathy 2. DVT Prophylaxis/Anticoagulation: Subcutaneous Lovenox. Monitor platelet counts of any signs of bleeding 3. Pain Management: Tylenol as needed 4. Acute renal insufficiency. Creatinine improved. Voiding/drinking well 5. Neuropsych: This patient is capable of making decisions on his own behalf. 6. Skin/Wound Care: Routine skin checks 7. Fluids/Electrolytes/Nutrition: encourage PO. Eating well 8. Gangrenous ischemic toes. Continued conservative care for now. Showing continued progress but will likely need surgical debridement after dc as outpt  -would like to make an appt with Dr. Edilia Bo as outpt for follow up of the toes 9. MSSA pneumonia. Patient has completed course of  Ancef 10. Hypertension. Norvasc 5 mg daily, Coreg 18.75 mg twice a day, lisinopril 10 mg daily, Aldactone 12.5 mg daily. BP's continue to run low: Consider decreasing lisinopril or stopping although he's not symptomatic 11. Anemia: hgb improving to 9.5  LOS (Days) 7 A FACE TO FACE EVALUATION WAS PERFORMED  Smt. Loder T 01/31/2015 7:48 AM

## 2015-01-31 NOTE — Progress Notes (Signed)
Occupational Therapy Session Note  Patient Details  Name: Alexander Berry MRN: [id number] Date of Birth: 07/06/1966  Today's Date: 01/31/2015 OT Individual Time: 1000-1100 OT Individual Time Calculation (min): 60 min   Short Term Goals: Week 1:  OT Short Term Goal 1 (Week 1): LTG=STG  Skilled Therapeutic Interventions/Progress Updates: ADL-retraining at shower level with focus on endurance and standing balance.   Pt completes bathing/dressing/grooming sitting and standing with setup assist from family to gather and provide clothing and supplies.  Pt ambulates to day room with therapist and completes dynamic standing balance activity using Wii Balance board with supervision.   Pt exhibits mild cognitive delay with processing directions of Wii system even with moderate cues from her daughter and several practice sessions.   Pt exhibits confusion during mind/body activity relating to processing numbers.     Therapy Documentation Precautions:  Precautions Precautions: Fall Precaution Comments: gangrenous left  toes (especially 3rd through 5th); monitor HR and O2; Requires frequent rest breaks during functional tasks Restrictions Weight Bearing Restrictions: No (pink sheet says WB as tolerated)   Vital Signs: Therapy Vitals Pulse Rate: 68 BP: (!) 104/56 mmHg   Pain: Pain Assessment Pain Score: 7  Pain Type: Acute pain Pain Location: Toe (Comment which one) Pain Orientation: Right Pain Descriptors / Indicators: Throbbing   See FIM for current functional status  Therapy/Group: Individual Therapy  Alexander Berry 01/31/2015, 11:23 AM

## 2015-01-31 NOTE — Progress Notes (Signed)
Occupational Therapy Session Note  Patient Details  Name: Alexander Berry MRN: [id number] Date of Birth: 11/17/1965  Today's Date: 01/31/2015 OT Individual Time: [id number] OT Individual Time Calculation (min): 30 min    Short Term Goals: Week 1:  OT Short Term Goal 1 (Week 1): LTG=STG  Skilled Therapeutic Interventions/Progress Updates:    Pt seen for 1:1 OT session with focus on functional mobility, dynamic standing balance, and activity tolerance. Pt received supine in bed. Pt sat EOB and donned shoes then ambulated apprx 200' to ortho gym at supervision level. Completed 3/4 exercises in HEP with focus on challenging higher level standing balance. Pt completed forward lunge and three-way leg swing 1x 10 reps on each LE with rest breaks between. Completed dynamic balance reaching while positioned on LLE and reaching across midline towards floor with RUE 5x before switching to RLE. Pt ambulated back to room and left sitting in w/c with family present. Pt SpO2 and HR WFL throughout therapy session.   Therapy Documentation Precautions:  Precautions Precautions: Fall Precaution Comments: gangrenous left  toes (especially 3rd through 5th); monitor HR and O2; Requires frequent rest breaks during functional tasks Restrictions Weight Bearing Restrictions: No General:   Vital Signs:  Pain: Pain Assessment Pain Assessment: No/denies pain  See FIM for current functional status  Therapy/Group: Individual Therapy  Daneil Danerkinson, Benjamim Harnish N 01/31/2015, 3:46 PM

## 2015-01-31 NOTE — Progress Notes (Signed)
Physical Therapy Session Note  Patient Details  Name: Alexander Berry MRN: [id number] Date of Birth: 04/13/1966  Today's Date: 01/31/2015 PT Individual Time: 0900-0958 PT Individual Time Calculation (min): 58 min   Short Term Goals: Week 1:  PT Short Term Goal 1 (Week 1): STGs = LTGs due to ELOS  Skilled Therapeutic Interventions/Progress Updates:   Pt received in w/c with L foot uncovered; pt reporting bleeding from L toes and increased pain. RN notified to cover toes and provide pt with pain medication.  While RN attending to pt discussed balance HEP and provided pt with handout of HEP.  Once toes bandaged and shoes donned pt ambulated to gym 150' supervision but with increased antalgic gait today.  Demonstrated to pt and had pt return demonstrate all balance exercises on HEP and discussed ways to progress exercises.  Pt required frequent sitting rest breaks due to SOB.  HR remained 80 bpm during session.  Performed stair negotiation up/down 4 stairs x 2 reps without rails and close supervision-min A with alternating sequence to ascend, step to sequence to descend with pt requiring less cues and physical assistance for balance.  Transitioned back to room and w/c supervision.  Therapy Documentation Precautions:  Precautions Precautions: Fall Precaution Comments: gangrenous left  toes (especially 3rd through 5th); monitor HR and O2; Requires frequent rest breaks during functional tasks Restrictions Weight Bearing Restrictions: No Vital Signs: Therapy Vitals Pulse Rate: 68 BP: (!) 104/56 mmHg Pain: Pain Assessment Pain Score: 7  Pain Type: Acute pain Pain Location: Toe (Comment which one) Pain Orientation: Right Pain Descriptors / Indicators: Throbbing Pain Onset: On-going Pain Intervention(s): RN made aware Locomotion : Ambulation Ambulation/Gait Assistance: 5: Supervision   See FIM for current functional status  Therapy/Group: Individual Therapy  Edman Berry, Alexander Metallo  [facility] CenterFaucette 01/31/2015, 12:05 PM

## 2015-01-31 NOTE — Progress Notes (Signed)
Social Work Patient ID: Alexander Berry, male   DOB: [birth year], 49 y.o.   MRN: [id number]   CSW met with pt to update him on team conference discussion.  Pt was pleased to hear he was going to be discharged on Thursday, June 30th, and he feels ready.  Family was not present, so CSW will f/u with them tomorrow.  Pt will not have any f/u therapy or DME.  CSW will assist pt with [facility] program for medication assistance.  CSW will continue to follow and assist as needed.

## 2015-01-31 NOTE — Patient Care Conference (Signed)
Inpatient RehabilitationTeam Conference and Plan of Care Update Date: 01/31/2015   Time: 2:00 PM    Patient Name: Alexander Berry      Medical Record Number: [id number]  Date of Birth: 02/06/1966 Sex: Male         Room/Bed: 4W06C/4W06C-01 Payor Info: Payor: MEDICAID POTENTIAL / Plan: MEDICAID POTENTIAL / Product Type: *No Product type* /    Admitting Diagnosis: Debility after cardiac arrest   Admit Date/Time:  01/24/2015  6:46 PM Admission Comments: No comment available   Primary Diagnosis:  Anoxic encephalopathy Principal Problem: Anoxic encephalopathy  Patient Active Problem List   Diagnosis Date Noted  . Debility 01/28/2015  . Gangrene of toe   . Cardiogenic shock   . Essential hypertension   . Gangrene of foot   . Acute on chronic renal failure   . Hypernatremia   . Shock liver   . Anoxic encephalopathy   . Dysphagia   . Absolute anemia   . Anoxic brain injury   . AKI (acute kidney injury)   . Septic shock 01/13/2015  . ARDS (adult respiratory distress syndrome) 01/13/2015  . Thrombocytopenia 01/13/2015  . Staphylococcal pneumonia 01/13/2015  . Encounter for intubation 01/12/2015  . Pneumonia, organism unspecified   . Acute pulmonary edema   . Hyperglycemia   . Encephalopathy acute   . Cardiac arrest 01/08/2015  . Acute respiratory failure with hypoxia 01/08/2015  . Hypokalemia 01/08/2015  . Ventricular tachycardia   . Acute ST elevation myocardial infarction (STEMI) involving right coronary artery     Expected Discharge Date: Expected Discharge Date: 02/02/15  Team Members Present: Physician leading conference: Dr. Faith RogueZachary Swartz Social Worker Present: Staci AcostaJenny Macy Polio, LCSW Nurse Present: Carmie EndAngie Joyce, RN PT Present: Karolee StampsAlison Gray, Varney BilesPT;Audra Hall, PT OT Present: Donzetta KohutFrank Barthold, OT;Other (comment) (Amy Melvyn NethLewis, OT) SLP Present: Jackalyn LombardNicole Page, SLP PPS Coordinator present : Tora DuckMarie Noel, RN, CRRN     Current Status/Progress Goal Weekly Team Focus  Medical   anoxic bi,  debility, necrotic toes which are healing---may need some debridiement   stabilze medically for dc  pain/wound care   Bowel/Bladder   Pt continent of bowel and bladder         Swallow/Nutrition/ Hydration             ADL's   Overall Supervision  Overall Mod I for BADL, supervision for shower transfer and memory  Improved dynamics standing balance, endurance, attention, memory, family education   Mobility   supervision overall; min A stairs with rail  Mod I for basic transfers, supervision gait, min A stairs without rail; added community gait and floor transfer goal  Family education, higher level balance and gait training, stairs   Communication   Sueprvision phrase-sentence level   Mod I phrase level   carryover of increased vocal intensty without reminders   Safety/Cognition/ Behavioral Observations  Supervision   Supervision   education    Pain   pain noted to left toes; PRN tylenol 650 q 4hrs PRN  3 or less  medicate as needed   Skin   left foot- last three toes nacrotic.   free of skin breakdown and infection min assist  assess skin q shift     Rehab Goals Patient on target to meet rehab goals: Yes Rehab Goals Revised: none *See Care Plan and progress notes for long and short-term goals.  Barriers to Discharge: none    Possible Resolutions to Barriers:  n/a    Discharge Planning/Teaching Needs:  Pt to go home  with his wife and dtr to provide supervision as needed.  Pt's family has been here for family education and it has gone well.    Team Discussion:  Pt is doing well medically and his necrotic toes are looking better, but he may need debridement as an outpt.  Pt is doing well with PT/OT and family has done family ed and therapists have given him a home exercise program, as he will not have any f/u therapies.  Pt still has some confusion with sequencing and memory deficits, but he's cleared a lot and voice is stronger.  He needs no DME or f/u.  Pt's goals are mod I and  family will most likely be with pt for a while.  Revisions to Treatment Plan:  none   Continued Need for Acute Rehabilitation Level of Care: The patient requires daily medical management by a physician with specialized training in physical medicine and rehabilitation for the following conditions: Daily direction of a multidisciplinary physical rehabilitation program to ensure safe treatment while eliciting the highest outcome that is of practical value to the patient.: Yes Daily medical management of patient stability for increased activity during participation in an intensive rehabilitation regime.: Yes Daily analysis of laboratory values and/or radiology reports with any subsequent need for medication adjustment of medical intervention for : Cardiac problems;Neurological problems  Jazzma Neidhardt, Vista Deck 01/31/2015, 2:48 PM

## 2015-01-31 NOTE — Progress Notes (Signed)
Speech Language Pathology Daily Session Note  Patient Details  Name: Alexander Berry MRN: [id number] Date of Birth: 06/18/1966  Today's Date: 01/31/2015 SLP Individual Time: 1100-1200 SLP Individual Time Calculation (min): 60 min  Short Term Goals: Week 1: SLP Short Term Goal 1 (Week 1): Patient will utilize an increased vocal itensity at the phrase level with Mod I.  SLP Short Term Goal 2 (Week 1): Patient will demonstrate functional problem solving for basic and familiar tasks with supervision verbal and question cues.  SLP Short Term Goal 3 (Week 1): Patient will demonstrate selective attention in a mildly distracting enviornment for 60 minutes with supervision verbal cues.  SLP Short Term Goal 4 (Week 1): Patient will utilize external memory aids to recall new, daily information with supervision verbal cues.  SLP Short Term Goal 5 (Week 1): Patient will demonstrate anticipatory awareness in regards to d/c planning and identiyfing barriers/activities that will require assistance at discharge with supervision verbal questions.   Skilled Therapeutic Interventions: Skilled treatment session focused on addressing speech and cognitive goals.  Interpreter was present for the duration of the session.  SLP facilitated session by providing cue x1 for increased vocal intensity at the conversational level throughout today's 60 minute session.  Patient reported that his memory has returned to baseline and he has no problem remembering information; however, after a semi-complex new learning task that SLP facilitated with Min faded to Supervision cues to utilize recall strategies, patient verbalized awareness of his errors.  Recommend re-forcing recall strategies and education tomorrow.     FIM:  Comprehension Comprehension Mode: Auditory Comprehension: 5-Understands complex 90% of the time/Cues < 10% of the time Expression Expression Mode: Verbal Expression: 5-Expresses complex 90% of the time/cues < 10%  of the time Social Interaction Social Interaction: 6-Interacts appropriately with others with medication or extra time (anti-anxiety, antidepressant). Problem Solving Problem Solving: 5-Solves complex 90% of the time/cues < 10% of the time Memory Memory: 5-Recognizes or recalls 90% of the time/requires cueing < 10% of the time FIM - Eating Eating Activity: 7: Complete independence:no helper  Pain Pain Assessment Pain Assessment: No/denies pain  Therapy/Group: Individual Therapy  Charlane FerrettiMelissa Avenly Roberge, M.A., CCC-SLP [phone]  Charlese Gruetzmacher 01/31/2015, 12:18 PM

## 2015-01-31 NOTE — Progress Notes (Signed)
   VASCULAR SURGERY ASSESSMENT & PLAN:  * His toes are continuing to improve gradually. He may be able to avoid toe amputation. I would soak the foot daily in lukewarm dials soaked soaks.  *  Once he is discharged I can follow his toes as an outpatient. I will arrange this follow-up.  SUBJECTIVE: No complaints  PHYSICAL EXAM: Filed Vitals:   01/30/15 1452 01/31/15 0545 01/31/15 0836 01/31/15 0837  BP: 103/58 106/68 104/56   Pulse: 81 67  68  Temp: 97.5 F (36.4 C) 98.4 F (36.9 C)    TempSrc: Oral Oral    Resp: 18 18    Height:      Weight:      SpO2: 98% 96%     Left third fourth and fifth toes with dry gangrene and minimal drainage.  There is no erythema.  LABS: Lab Results  Component Value Date   WBC 6.9 01/25/2015   HGB 9.5* 01/25/2015   HCT 29.8* 01/25/2015   MCV 88.2 01/25/2015   PLT 882* 01/25/2015   Lab Results  Component Value Date   CREATININE 1.09 01/31/2015   Lab Results  Component Value Date   INR 1.70* 01/08/2015   Principal Problem:   Anoxic encephalopathy Active Problems:   Essential hypertension   Gangrene of toe   Debility   Cari CarawayChris Frankie Scipio Beeper: [phone] 01/31/2015

## 2015-02-01 ENCOUNTER — Inpatient Hospital Stay (HOSPITAL_COMMUNITY): Payer: MEDICAID | Admitting: Physical Therapy

## 2015-02-01 ENCOUNTER — Inpatient Hospital Stay (HOSPITAL_COMMUNITY): Payer: Self-pay | Admitting: Occupational Therapy

## 2015-02-01 ENCOUNTER — Encounter (HOSPITAL_COMMUNITY): Payer: Self-pay

## 2015-02-01 ENCOUNTER — Inpatient Hospital Stay (HOSPITAL_COMMUNITY): Payer: MEDICAID | Admitting: Speech Pathology

## 2015-02-01 ENCOUNTER — Inpatient Hospital Stay (HOSPITAL_COMMUNITY): Payer: Self-pay

## 2015-02-01 NOTE — Discharge Summary (Signed)
Discharge summary job [id number]

## 2015-02-01 NOTE — Progress Notes (Signed)
[facility]IL[facility]     PROGRESS NOTE    Subjective/Complaints: Feeling well. No issues. Excited that he's going home tomorrow. Pain controlled.  ROS: Pt denies  nausea, vomiting, abdominal pain, diarrhea, chest pain, shortness of breath, palpitations, dysuria, dizziness, neck or back pain, bleeding,   Objective: Vital Signs: Blood pressure 93/44, pulse 70, temperature 98.6 F (37 C), temperature source Oral, resp. rate 17, height  (1.702 m), weight 85.5 kg (188 lb 7.9 oz), SpO2 98 %. No results found. No results for input(s): WBC, HGB, HCT, PLT in the last 72 hours.  Recent Labs  01/31/15 0825  CREATININE 1.09   CBG (last 3)  No results for input(s): GLUCAP in the last 72 hours.  Wt Readings from Last 3 Encounters:  01/25/15 85.5 kg (188 lb 7.9 oz)  01/24/15 85.594 kg (188 lb 11.2 oz)    Physical Exam:   Constitutional: He appears well-developed. No distress HENT: dentition fair Head: Normocephalic.  Eyes: EOM are normal.  Neck: Normal range of motion. Neck supple. No thyromegaly present.  Cardiovascular: Normal rate and regular rhythm.  Respiratory: Effort normal and breath sounds normal. No respiratory distress.  GI: Soft. Bowel sounds are normal. He exhibits no distension.  Neurological: He is alert.  Limited English but can communicate fairly well with me using mixture of english and spanish.  UE 4/5 prox to distal. LE: 3-/5 hf, 3/5ke, 4/5 ankles, some pain limitations  Skin: Lateral three toes on left foot are  demarcated--along pads of toes---overall looking better, dorsal aspects look quite viable..  Toes less tender to touch. Some bruising over the knees/shins which is healing Psychiatric: He has a normal mood and affect. His behavior is normal  Assessment/Plan: 1. 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 Physiatrist is providing close team supervision and 24 hour management of active medical problems listed below. Physiatrist and rehab team continue to assess barriers to discharge/monitor patient progress toward functional and medical goals. FIM: FIM - Bathing Bathing Steps Patient Completed: Chest, Right Arm, Left Arm, Abdomen, Front perineal area, Buttocks, Right upper leg, Left upper leg, Right lower leg (including foot), Left lower leg (including foot) Bathing: 5: Supervision: Safety issues/verbal cues  FIM - Upper Body Dressing/Undressing Upper body dressing/undressing steps patient completed: Thread/unthread right sleeve of pullover shirt/dresss, Thread/unthread left sleeve of pullover shirt/dress, Put head through opening of pull over shirt/dress, Pull shirt over trunk Upper body dressing/undressing: 7: Complete Independence: No helper FIM - Lower Body Dressing/Undressing Lower body dressing/undressing steps patient completed: Thread/unthread right underwear leg, Thread/unthread left underwear leg, Pull underwear up/down, Thread/unthread right pants leg, Thread/unthread left pants leg, Pull pants up/down, Fasten/unfasten pants, Don/Doff right sock, Don/Doff left sock, Don/Doff right shoe, Don/Doff left shoe, Fasten/unfasten right shoe, Fasten/unfasten left shoe Lower body dressing/undressing: 5: Supervision: Safety issues/verbal cues  FIM - Toileting Toileting steps completed by patient: Adjust clothing prior to toileting, Performs perineal hygiene, Adjust clothing after toileting Toileting Assistive Devices: Grab bar or rail for support Toileting: 6: More than reasonable amount of time  FIM - Diplomatic Services operational officer Devices: Grab bars Toilet Transfers: 6-To toilet/ BSC, 5-From toilet/BSC: Supervision (verbal cues/safety issues)  FIM - Banker Devices: Arm rests Bed/Chair Transfer: 6:  Supine > Sit: No assist, 6: Sit > Supine: No assist, 5: Bed > Chair or W/C: Supervision (verbal cues/safety issues), 5:  Chair or W/C > Bed: Supervision (verbal cues/safety issues)  FIM - Locomotion: Wheelchair Distance: 200 Locomotion: Wheelchair: 0: Activity did not occur FIM - Locomotion: Ambulation Locomotion: Ambulation Assistive Devices: Other (comment) (no assistive device) Ambulation/Gait Assistance: 5: Supervision Locomotion: Ambulation: 5: Travels 150 ft or more with supervision/safety issues  Comprehension Comprehension Mode: Auditory Comprehension: 5-Understands complex 90% of the time/Cues < 10% of the time  Expression Expression Mode: Verbal Expression: 5-Expresses complex 90% of the time/cues < 10% of the time  Social Interaction Social Interaction: 6-Interacts appropriately with others with medication or extra time (anti-anxiety, antidepressant).  Problem Solving Problem Solving: 5-Solves complex 90% of the time/cues < 10% of the time  Memory Memory: 5-Recognizes or recalls 90% of the time/requires cueing < 10% of the time  Medical Problem List and Plan: 1. Functional deficits secondary to debilitation after cardiac arrest status post stenting/potential anoxic encephalopathy 2. DVT Prophylaxis/Anticoagulation: Subcutaneous Lovenox. Monitor platelet counts of any signs of bleeding 3. Pain Management: Tylenol as needed 4. Acute renal insufficiency. Creatinine improved. Voiding/drinking well 5. Neuropsych: This patient is capable of making decisions on his own behalf. 6. Skin/Wound Care: Routine skin checks 7. Fluids/Electrolytes/Nutrition: encourage PO. Eating well 8. Gangrenous ischemic toes. Continued conservative care for now.   -toes continue to improve  -still will need follow up appt with Dr. Edilia Boickson as outpt   9. MSSA pneumonia. Patient has completed course of Ancef 10. Hypertension. Norvasc 5 mg daily, Coreg 18.75 mg twice a day, lisinopril 10 mg daily,  Aldactone 12.5 mg daily. BP's continue to run low: Consider decreasing lisinopril or stopping although he's not symptomatic 11. Anemia: hgb improving to 9.5  LOS (Days) 8 A FACE TO FACE EVALUATION WAS PERFORMED  Queenie Aufiero T 02/01/2015 8:03 AM

## 2015-02-01 NOTE — Progress Notes (Signed)
Speech Language Pathology Discharge Summary  Patient Details  Name: Alexander Berry MRN: [id number] Date of Birth: 1966/07/31  Today's Date: 02/01/2015 SLP Individual Time: 1030-1100 SLP Individual Time Calculation (min): 30 min   Skilled Therapeutic Interventions:   Skilled treatment session focused on addressing cognition goals and wrap up of patient and family education.  Interpreter was present for the duration of today's session.  SLP facilitated session by providing Supervision level question cues to recall previously discussed memory compensatory strategies.  SLP also utilized teach back with verbal and written reinforcement of recommended memory compensatory strategies; patient and daughter verbalized understanding of information.  Goals met patient ready for discharge.   Patient has met 5 of 5 long term goals.  Patient to discharge at overall Modified Independent;Supervision level.  Reasons goals not met: n/a   Clinical Impression/Discharge Summary:    Patient has made functional gains during this rehab admission and has met 5 of 5 long term goals due to improved functional abilities.  Patient is currently an overall Supervision assist for complex tasks and recall of new information and Mod I for basic cognitive tasks.  Patient and family education has been completed and patient will discharge home with necessary level of assist.  Family is able to assist patient with utilization of memory strategies and since patient scored WNL on a reassessment of the [place] no skilled follow up therapy is warranted at this time.     Care Partner:  Caregiver Able to Provide Assistance: Yes  Type of Caregiver Assistance: Cognitive  Recommendation:  Other (comment) (Supervision with complex tasks)  Rationale for SLP Follow Up: Other (comment) (n/a)   Equipment: none   Reasons for discharge: Discharged from hospital;Treatment goals met   Patient/Family Agrees with Progress Made and Goals Achieved:  Yes   See FIM for current functional status  Carmelia Roller., CCC-SLP [phone]  [name] 02/01/2015, 4:17 PM

## 2015-02-01 NOTE — Progress Notes (Signed)
Occupational Therapy Discharge Summary  Patient Details  Name: Alexander Berry MRN: [id number] Date of Birth: 05/17/1966  Patient has met 10 of 10 long term goals due to improved activity tolerance, improved balance, postural control and improved coordination. Pt made steady progress with BADLs during this admission.  Pt performs all BADLs at mod I but will require 24 hour supervision on discharge secondary to ongoing cognitive impairments and decreased safety awareness. Patient to discharge at overall Modified Independent level.  Patient's care partner is independent to provide the necessary physical assistance at discharge.     Recommendation:  No further OT needs required at this time  Equipment: No equipment provided  Reasons for discharge: treatment goals met and discharge from hospital  Patient/family agrees with progress made and goals achieved: Yes  OT Discharge   Vision/Perception  Vision- History Baseline Vision/History: No visual deficits Patient Visual Report: Blurring of vision Perception Comments: appears wfl Praxis Praxis-Other Comments: appears wfl  Cognition Overall Cognitive Status: Impaired/Different from baseline Arousal/Alertness: Awake/alert Orientation Level: Oriented X4 Attention: Focused;Sustained Focused Attention: Appears intact Sustained Attention: Appears intact Memory: Impaired Memory Impairment: Decreased recall of new information Awareness: Impaired Problem Solving: Impaired Problem Solving Impairment: Functional basic;Functional complex Sensation Sensation Light Touch: Appears Intact Stereognosis: Not tested Hot/Cold: Appears Intact Proprioception: Appears Intact Coordination Gross Motor Movements are Fluid and Coordinated: Yes Fine Motor Movements are Fluid and Coordinated: Yes Finger Nose Finger Test: wfl bilaterally Heel Shin Test: wfl bilaterally Motor  Motor Motor: Abnormal postural alignment and control Motor - Skilled  Clinical Observations: general slouched sitting posture - likely premorbid Motor - Discharge Observations: continued slouched posture, req verbal cues for upright posture Mobility  Bed Mobility Bed Mobility: Rolling Right;Rolling Left;Sit to Supine;Right Sidelying to Sit Rolling Right: 7: Independent Rolling Left: 7: Independent Right Sidelying to Sit: 7: Independent Sit to Supine: 7: Independent Transfers Sit to Stand: 6: Modified independent (Device/Increase time) Stand to Sit: 6: Modified independent (Device/Increase time)  Trunk/Postural Assessment  Cervical Assessment Cervical Assessment: Within Functional Limits Thoracic Assessment Thoracic Assessment: Within Functional Limits Lumbar Assessment Lumbar Assessment: Within Functional Limits Postural Control Postural Control: Deficits on evaluation Postural Limitations: generalized slouched posture   Balance Balance Balance Assessed: Yes Static Sitting Balance Static Sitting - Balance Support: Feet supported Static Sitting - Level of Assistance: 7: Independent Dynamic Sitting Balance Dynamic Sitting - Balance Support: Feet supported;During functional activity;Left upper extremity supported;Right upper extremity supported Dynamic Sitting - Level of Assistance: 6: Modified independent (Device/Increase time) Static Standing Balance Static Standing - Balance Support: During functional activity Static Standing - Level of Assistance: 7: Independent Dynamic Standing Balance Dynamic Standing - Balance Support: During functional activity Dynamic Standing - Level of Assistance: 7: Independent Dynamic Standing - Balance Activities: Lateral lean/weight shifting;Forward lean/weight shifting Extremity/Trunk Assessment RUE Assessment RUE Assessment: Within Functional Limits RUE AROM (degrees) Overall AROM Right Upper Extremity: Within functional limits for tasks performed RUE Strength RUE Overall Strength: Within Functional Limits for  tasks performed LUE Assessment LUE Assessment: Within Functional Limits LUE AROM (degrees) Overall AROM Left Upper Extremity: Within functional limits for tasks assessed LUE Strength LUE Overall Strength: Within Functional Limits for tasks assessed  See FIM for current functional status  Lanier, Thomas Chappell 02/01/2015, 12:31 PM  

## 2015-02-01 NOTE — Progress Notes (Signed)
Occupational Therapy Session Note  Patient Details  Name: Alexander Berry MRN: [id number] Date of Birth: 04/10/1966  Today's Date: 02/01/2015 OT Individual Time: 1100-1200 OT Individual Time Calculation (min): 60 min    Short Term Goals: Week 1:  OT Short Term Goal 1 (Week 1): LTG=STG  Skilled Therapeutic Interventions/Progress Updates:    Pt initially engaged in BADLs including bathing at shower level (standing approx 90%) and dressing with sit<>stand from chair.  Pt completed all tasks at Mod I level.  Pt amb to therapy gym and engaged in dynamic balance activities with Wii balance board.  Pt was able to independently locate therapy gym and his room at end of session.  Pt remained in room with family present at end of session.  Focus on activity tolerance, sit<>stand, dynamic standing balance, functional amb without AD, and safety awareness.  Therapy Documentation Precautions:  Precautions Precautions: Fall Precaution Comments: gangrenous left  toes (especially 3rd through 5th); monitor HR and O2; Requires frequent rest breaks during functional tasks Restrictions Weight Bearing Restrictions: No Pain: Pain Assessment Pain Assessment: 0-10 Pain Score: 2  Pain Type: Acute pain Pain Location: Toe (Comment which one) (3rd-5th) Pain Orientation: Left Pain Descriptors / Indicators: Aching Pain Onset: On-going Pain Intervention(s): RN aware Multiple Pain Sites: No  See FIM for current functional status  Therapy/Group: Individual Therapy  Rich BraveLanier, Yanira Tolsma Chappell 02/01/2015, 12:20 PM

## 2015-02-01 NOTE — Consult Note (Signed)
NEUROCOGNITIVE STATUS EXAMINATION - [facility]   Mr. Alexander Berry is a 49 year old man, who was seen for a brief neurocognitive status examination to evaluate his emotional state and mental status post-cardiac arrest.  According to his medical record, he was admitted on 01/08/15 after cardiac arrest that occurred while playing soccer.  CPR was performed on the scene.  He was intubated on the field.  EEG was negative for seizure and noted generalized slowing indicating mild to moderate cerebral disturbance encephalopathy.  Cranial CT was negative.  He self-extubated on 01/12/15 and sepsis was suspected at that time and was therefore treated with antibiotics.  He has ischemic toes that may eventually require amputation.  Neuropsychological consult was requested to ascertain whether any cognitive changes were present secondary to potential anoxia or encephalopathy and to provide an opportunity to process emotions associated with this event.    Emotional Functioning:  During the clinical interview, Mr. Alexander Berry's wife and daughter were present, but he did not object to this.  In fact, his daughter translated during the current session.  Mr. Alexander Berry acknowledged the presence of sadness and frustration over being in the hospital and not being able to be active, but he denied symptoms of major mood disruption, including suicidal ideation.  He said that it was more difficult to cope at first with being in the hospital in general, as he has not been hospitalized previously.  However, he said that he is in a better emotional place now and is motivated to work toward physical recovery.  The only worry that he disclosed was concern over the fact that his family is worried about him.  He denied any major concerns at this point.    Mental Status:  Mr. Alexander Berry's total score on a brief measure of mental status was not suggestive of the presence of significant cognitive disruption at this time  (MoCA = 28/30).  He lost points for freely recalling only 4 of 5 previously studied words, for making an error when repeating a series of numbers, and on one item assessing verbal abstract reasoning.  Subjectively, he denied noticing cognitive changes.    Impressions and Recommendations:  Mr. Alexander Berry's total score on an overall measure of mental status was not suggestive of the presence of marked cognitive impairment at this time.  However, he was advised that he could have a more extensive cognitive test performed should he notice cognitive issues in the future.  From an emotional standpoint, he acknowledged frustration and some mild sadness, which seem to be appropriate adjustment reactions to his current situation. There was no evidence to suggest the presence of clinically significant mood disruption at this time.  Risks of development of worsening depression were explained to Mr. Alexander Berry and he agreed to inform someone should his mood worsen in the future.    DIAGNOSIS:   Anoxic encephalopathy  Leavy CellaKaren Odelle Alexander Berry, Psy.D.  Clinical Neuropsychologist

## 2015-02-01 NOTE — Plan of Care (Signed)
Problem: RH Wheelchair Mobility Goal: LTG Patient will propel w/c in controlled environment (PT) LTG: Patient will propel wheelchair in controlled environment, # of feet with assist (PT)  Outcome: Not Applicable Date Met:  12/12/69 Due to ambulatory status Goal: LTG Patient will propel w/c in home environment (PT) LTG: Patient will propel wheelchair in home environment, # of feet with assistance (PT).  Outcome: Not Applicable Date Met:  25/24/79 Due to ambulatory status

## 2015-02-01 NOTE — Discharge Summary (Signed)
NAME:  Alexander Berry, Alexander Berry, Alexander Berry  MEDICAL RECORD NO.:  [id number]  LOCATION:  [id number]                        FACILITY:  [facility]  PHYSICIAN:  Ranelle Oyster, M.D.DATE OF BIRTH:  Sep 29, 1965  DATE OF ADMISSION:  01/23/2015 DATE OF DISCHARGE:  02/02/2015                              DISCHARGE SUMMARY   DISCHARGE DIAGNOSES: 1. Functional deficits secondary to debilitation after cardiac arrest,     status post stenting with potential anoxic encephalopathy. 2. Subcutaneous Lovenox for DVT prophylaxis. 3. Gangrenous ischemic toes. 4. Methicillin-susceptible Staphylococcus aureus pneumonia, resolved. 5. Hypertension. 6. Anemia.  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  Admitted on January 08, 2015, after witnessed out-of-hospital cardiac arrest while playing soccer.  Bystander performed CPR.  EMS arrival the patient with VFib.  Received epinephrine and amiodarone.  He was intubated in the field.  EKG showed global ST elevations.  Underwent emergent cardiac catheterization with stenting, placed on Brilinta. Echocardiogram with ejection fraction of 60% and mild posterior wall hypokinesis.  EEG negative for seizure.  There was noted generalized slowing indicating mild-to-moderate cerebral disturbance encephalopathy. Cranial CT scan negative.  Hospital course, respiratory failure.  Acute renal insufficiency.  Creatinine 2.98.  Patient self-extubated on January 12, 2015.  Developed persistent fever, suspect sepsis, maintained on broad-spectrum antibiotics with respiratory cultures showing MSSA pneumonia, blood cultures negative.  Subcutaneous Lovenox added for DVT prophylaxis.  He was placed on a dysphagia #2 nectar thick liquid diet. Vascular Surgery consulted for ischemic toes with demarcation, suspect auto amputation.  Renal function continued to improve 1.07.  The  patient was admitted for comprehensive rehab program.  PAST MEDICAL HISTORY:  See discharge diagnoses.  SOCIAL HISTORY:  Lives with spouse, independent prior to admission. Functional status upon admission to rehab services was minimal assist +2 safety to ambulate with a rolling walker, max assist squat pivot transfers, mod to max assist for activities of daily living.  PHYSICAL EXAMINATION:  VITAL SIGNS:  Blood pressure 116/68, pulse 81, temperature 98, respirations 16. GENERAL:  This was an alert male, limited English-speaking, followed simple commands, answered yes no questions. LUNGS:  Clear to auscultation. CARDIAC:  Regular rate and rhythm. ABDOMEN:  Soft, nontender.  Good bowel sounds. EXTREMITIES:  Lateral 3 toes on the left with demarcation.  Pedal pulses with tips of toe significant ischemic changes.  REHABILITATION HOSPITAL COURSE:  The patient was admitted to inpatient rehab services with therapies initiated on a 3-hour daily basis consisting of physical therapy, occupational therapy, speech therapy, and rehabilitation nursing.  The following issues were addressed during the patient's rehabilitation stay.  Pertaining to Mr. Myszka functional deficits secondary to debilitation after cardiac arrest, he had undergone stenting, maintained on Brilinta.  He would follow up the Cardiology Services.  Subcutaneous Lovenox for DVT prophylaxis.  His diet was slowly advanced to a regular consistency.  Blood pressure is well controlled on Norvasc, lisinopril, and Coreg.  Low-dose aspirin 81 mg daily.  No chest pain or shortness of breath.  He is followed closely by Vascular Surgery for gangrenous ischemic toes.  They were showing slow progress, question need for  surgical debridement after discharge as an outpatient and would follow up with Vascular Surgery.  Advised to soak feet daily in lukewarm Dial soap.  The patient received weekly collaborative interdisciplinary team conferences  to discuss estimated length of stay, family teaching, and any barriers to discharge.  He ambulated 150 feet at supervision, increased endurance.  Demonstrated good balance overall.  Rest breaks were granted.  Heart rate remained well controlled.  Navigating stairs, close supervision and minimal assist.  He can gather his belongings for activities of daily living and homemaking.  In regard to his anoxic encephalopathy, an interpreter helped the patient overall during sessions.  Reported that his memory had returned to baseline.  He was able to solve simple semi-complex problems.  Utilized recall strategies well.  Full family teaching was completed and plan discharge to home.  DISCHARGE MEDICATIONS: 1. Norvasc 5 mg p.o. daily. 2. Aspirin 81 mg p.o. daily. 3. Coreg 18.75 mg p.o. b.i.d. 4. Colace 100 mg p.o. b.i.d. as needed. 5. Lisinopril 10 mg p.o. daily. 6. Multivitamin 1 tablet daily. 7. Brilinta 90 mg p.o. b.i.d.  DIET:  Regular.  FOLLOWUP:  The patient would follow up Dr. Faith RogueZachary Swartz at the outpatient rehab service office as needed; Dr. Waverly Ferrarihristopher Dickson, call for appointment 2 weeks; Dr. Verdis PrimeHenry Smith, Cardiology Services, call for appointment; [facility], appointment to be made.     Mariam Dollaraniel Shaianne Nucci, P.A.   ______________________________ Ranelle OysterZachary T. Swartz, M.D.    DA/MEDQ  D:  02/01/2015  T:  02/01/2015  Job:  [id number]  cc:   Lyn RecordsHenry W. Smith, M.D. [facility] Lumbertonhristopher S. Edilia Boickson, M.D.

## 2015-02-01 NOTE — Progress Notes (Signed)
Occupational Therapy Session Note  Patient Details  Name: Alexander Berry MRN: [id number] Date of Birth: 12/08/1965  Today's Date: 02/01/2015 OT Individual Time: 1400-1500 OT Individual Time Calculation (min): 60 min    Short Term Goals: Week 1:  OT Short Term Goal 1 (Week 1): LTG=STG  Skilled Therapeutic Interventions/Progress Updates:    Pt seen for OT session focusing on functional activity tolerance, STM, and functional standing balance. Pt in w/Berry upon arrival, agreeable to tx.Pt's daughter present for session and serving as interpreter throughout session Pt ambulated throughout unit/ hospital to off unit outdoor area supervision- mod I. Min VCs provided to take seated rest breaks when needed. HR and O2 levels remained stable and WFL throughout long ambulation trial. He demonstrated good functional balance and ambulation ability, walking over various terrains and up/down ramps without LOB episodes. Pt able to kick soccer ball as he went down hall way, using B LEs with supervision. He required min VCs to retrace route to find way back to unit. Upon return to unit, pt completed Wii video game, completing various balance games requiring weight shifts to B sides and anterior/ posterior with supervision. Pt ambulated back to room at end of session mod I. Left sitting in w/Berry at end of session, all needs in reach.    Pt and daughter educated regarding energy conservation, importance of taking rest breaks before needed, fall risks at home including pets, and d/Berry planning.   Therapy Documentation Precautions:  Precautions Precautions: Fall Precaution Comments: gangrenous left  toes (especially 3rd through 5th); monitor HR and O2; Requires frequent rest breaks during functional tasks Restrictions Weight Bearing Restrictions: No Pain: Pain Assessment Pain Assessment: No/denies pain  See FIM for current functional status  Therapy/Group: Individual Therapy  Alexander Berry, Alexander Berry 02/01/2015, 2:44 PM

## 2015-02-01 NOTE — Progress Notes (Signed)
Physical Therapy Discharge Summary  Patient Details  Name: Alexander Berry MRN: [id number] Date of Birth: 1966/05/01  Today's Date: 02/01/2015 PT Individual Time: 0900-1000 PT Individual Time Calculation (min): 60 min    Patient has met 11 of 11 long term goals due to improved activity tolerance, improved balance, improved postural control, increased strength, decreased pain, improved awareness and improved coordination.  Patient to discharge at an ambulatory level Modified Independent.   Patient's care partner is independent to provide the necessary physical and cognitive assistance at discharge.   Equipment: No equipment provided  Reasons for discharge: treatment goals met and discharge from hospital  Patient/family agrees with progress made and goals achieved: Yes  Therapeutic Interventions: Therapeutic Activity: Pt completes all bed mobility Independent and transfers mod I with use of armrests. Pt completes furniture transfers mod I with use of hands and car transfer in simulated car in low position mod I without LOB.   Gait Training: Pt ambulates in controlled environment x 300' mod I - with slightly lower speed and variable BOS and extra lateral trunk leaning.  Pt ambulates up/down 12 steps with 1 rail mod I in step over step pattern up and step-to pattern down. Pt's daughter demonstrates safe CGA for pt to ambulate up/down 4 steps without rail, simulating entrance/exit from home.   Community Integration: Pt ambulates outside on uneven terrain and up/down ramp mod I without LOB. PT instructs pt in pathfinding activity back onto unit and pt completes this with verbal cues to use signs.   Therapeutic Exercise: Pt demonstrates ability to do HEP safely: mini squats, figure eight walking, sideways walking, tandem walking, single leg stance, heel walking, sit to stands, and stair walking.   Pt safe to d/c home.     PT Discharge Precautions/Restrictions Precautions Precautions:  Fall Restrictions Weight Bearing Restrictions: No Pain Pain Assessment Pain Assessment: 0-10 Pain Score: 2  Pain Type: Acute pain Pain Location: Toe (Comment which one) (3rd-5th) Pain Orientation: Left Pain Descriptors / Indicators: Aching Pain Onset: On-going Pain Intervention(s): Medication (See eMAR) Multiple Pain Sites: No Vision/Perception  Perception Comments: appears wfl Praxis Praxis-Other Comments: appears wfl  Cognition Overall Cognitive Status: Impaired/Different from baseline Arousal/Alertness: Awake/alert Orientation Level: Oriented to person;Oriented to time;Oriented to place;Oriented to situation Attention: Focused;Sustained Focused Attention: Appears intact Sustained Attention: Appears intact Memory: Impaired Memory Impairment: Decreased recall of new information (3/4 words recalled) Problem Solving: Impaired Problem Solving Impairment: Functional basic;Functional complex Sensation Sensation Light Touch: Impaired Detail (slight numbness in 3rd - 5th L toes) Stereognosis: Not tested Hot/Cold: Not tested Proprioception: Appears Intact Coordination Gross Motor Movements are Fluid and Coordinated: Yes Fine Motor Movements are Fluid and Coordinated: Not tested Finger Nose Finger Test: wfl bilaterally Heel Shin Test: wfl bilaterally Motor  Motor Motor: Abnormal postural alignment and control Motor - Discharge Observations: continued slouched posture, req verbal cues for upright posture  Mobility Bed Mobility Bed Mobility: Rolling Right;Rolling Left;Sit to Supine;Right Sidelying to Sit Rolling Right: 7: Independent Rolling Left: 7: Independent Right Sidelying to Sit: 7: Independent Sit to Supine: 7: Independent Transfers Transfers: Yes Sit to Stand: 6: Modified independent (Device/Increase time) Stand to Sit: 6: Modified independent (Device/Increase time) Stand Pivot Transfers: 6: Modified independent (Device/Increase time) Locomotion   Ambulation Ambulation: Yes Ambulation/Gait Assistance: 6: Modified independent (Device/Increase time) Ambulation Distance (Feet): 300 Feet Assistive device: None Ambulation/Gait Assistance Details: slight variable bos and slight increase in lateral trunk movement Gait Gait: Yes Gait Pattern: Impaired Gait Pattern: Narrow base of support;Wide base of  support Gait velocity: slowed slightly from normal Stairs / Additional Locomotion Stairs: Yes Stairs Assistance: 6: Modified independent (Device/Increase time) Stair Management Technique: One rail Right Number of Stairs: 12 Height of Stairs: 6 Wheelchair Mobility Wheelchair Mobility: No (due to ambulation ability)  Trunk/Postural Assessment  Cervical Assessment Cervical Assessment: Within Functional Limits Thoracic Assessment Thoracic Assessment: Within Functional Limits Lumbar Assessment Lumbar Assessment: Within Functional Limits Postural Control Postural Control: Deficits on evaluation Postural Limitations: generalized slouched posture   Balance Balance Balance Assessed: Yes Static Sitting Balance Static Sitting - Balance Support: Feet supported Static Sitting - Level of Assistance: 7: Independent Dynamic Sitting Balance Dynamic Sitting - Balance Support: Feet supported;During functional activity;Left upper extremity supported;Right upper extremity supported Dynamic Sitting - Level of Assistance: 6: Modified independent (Device/Increase time) Static Standing Balance Static Standing - Balance Support: During functional activity Static Standing - Level of Assistance: 7: Independent Dynamic Standing Balance Dynamic Standing - Balance Support: During functional activity Dynamic Standing - Level of Assistance: 7: Independent Dynamic Standing - Balance Activities: Lateral lean/weight shifting;Forward lean/weight shifting Extremity Assessment  RUE Assessment RUE Assessment: Within Functional Limits RUE AROM (degrees) Overall  AROM Right Upper Extremity: Within functional limits for tasks performed RUE Strength RUE Overall Strength: Within Functional Limits for tasks performed LUE Assessment LUE Assessment: Within Functional Limits LUE AROM (degrees) Overall AROM Left Upper Extremity: Within functional limits for tasks assessed LUE Strength LUE Overall Strength: Within Functional Limits for tasks assessed RLE Assessment RLE Assessment: Exceptions to [facility] RLE AROM (degrees) Overall AROM Right Lower Extremity: Within functional limits for tasks assessed RLE Strength RLE Overall Strength: Deficits RLE Overall Strength Comments: hip flexion 4+/5, knee flexion 4+/5, otherwise 5/5 LLE Assessment LLE Assessment: Exceptions to WFL LLE AROM (degrees) Overall AROM Left Lower Extremity: Within functional limits for tasks assessed LLE Strength LLE Overall Strength: Deficits LLE Overall Strength Comments: hip flexion 4+/5, otherwise 5/5  See FIM for current functional status  Makyla Bye M 02/01/2015, 9:31 AM

## 2015-02-02 DIAGNOSIS — G931 Anoxic brain damage, not elsewhere classified: Principal | ICD-10-CM

## 2015-02-02 DIAGNOSIS — I96 Gangrene, not elsewhere classified: Secondary | ICD-10-CM

## 2015-02-02 DIAGNOSIS — R5381 Other malaise: Secondary | ICD-10-CM

## 2015-02-02 DIAGNOSIS — I10 Essential (primary) hypertension: Secondary | ICD-10-CM

## 2015-02-02 MED ORDER — ADULT MULTIVITAMIN W/MINERALS CH: 1.0000 | ORAL_TABLET | Freq: Every day | ORAL | Status: AC

## 2015-02-02 MED ORDER — LISINOPRIL 10 MG PO TABS: 10.0000 mg | ORAL_TABLET | Freq: Every day | ORAL | Status: AC

## 2015-02-02 MED ORDER — TICAGRELOR 90 MG PO TABS: 90.0000 mg | ORAL_TABLET | Freq: Two times a day (BID) | ORAL | Status: AC

## 2015-02-02 MED ORDER — CARVEDILOL 6.25 MG PO TABS: 18.7500 mg | ORAL_TABLET | Freq: Two times a day (BID) | ORAL | Status: AC

## 2015-02-02 MED ORDER — AMLODIPINE BESYLATE 5 MG PO TABS: 5.0000 mg | ORAL_TABLET | Freq: Every day | ORAL | Status: AC

## 2015-02-02 NOTE — Progress Notes (Signed)
[place] PHYSICAL MEDICINE & REHABILITATION     PROGRESS NOTE    Subjective/Complaints: Ready to go home. Up in chair dressed! Feeling well  ROS: Pt denies  nausea, vomiting, abdominal pain, diarrhea, chest pain, shortness of breath, palpitations, dysuria, dizziness, neck or back pain, bleeding,   Objective: Vital Signs: Blood pressure 109/57, pulse 75, temperature 98.9 F (37.2 C), temperature source Oral, resp. rate 18, height  (1.702 m), weight 85.5 kg (188 lb 7.9 oz), SpO2 100 %. No results found. No results for input(s): WBC, HGB, HCT, PLT in the last 72 hours.  Recent Labs  01/31/15 0825  CREATININE 1.09   CBG (last 3)  No results for input(s): GLUCAP in the last 72 hours.  Wt Readings from Last 3 Encounters:  01/25/15 85.5 kg (188 lb 7.9 oz)  01/24/15 85.594 kg (188 lb 11.2 oz)    Physical Exam:   Constitutional: He appears well-developed. No distress HENT: dentition fair Head: Normocephalic.  Eyes: EOM are normal.  Neck: Normal range of motion. Neck supple. No thyromegaly present.  Cardiovascular: Normal rate and regular rhythm.  Respiratory: Effort normal and breath sounds normal. No respiratory distress.  GI: Soft. Bowel sounds are normal. He exhibits no distension.  Neurological: He is alert.  Limited English but can communicate fairly well with me.  UE 4/5 prox to distal. LE: 3-/5 hf, 3/5ke, 4/5 ankles, some pain limitations  Skin: Lateral three toes on left foot are  demarcated--along pads of toes---overall looking better, dorsal aspects look quite viable..  Toes less tender to touch. Some bruising over the knees/shins which is healing Psychiatric: He has a normal mood and affect. His behavior is normal  Assessment/Plan: 1. 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 Physiatrist is providing  close team supervision and 24 hour management of active medical problems listed below. Physiatrist and rehab team continue to assess barriers to discharge/monitor patient progress toward functional and medical goals. FIM: FIM - Bathing Bathing Steps Patient Completed: Chest, Right Arm, Left Arm, Abdomen, Front perineal area, Buttocks, Right upper leg, Left upper leg, Right lower leg (including foot), Left lower leg (including foot) Bathing: 5: Supervision: Safety issues/verbal cues  FIM - Upper Body Dressing/Undressing Upper body dressing/undressing steps patient completed: Thread/unthread right sleeve of pullover shirt/dresss, Thread/unthread left sleeve of pullover shirt/dress, Put head through opening of pull over shirt/dress, Pull shirt over trunk Upper body dressing/undressing: 7: Complete Independence: No helper FIM - Lower Body Dressing/Undressing Lower body dressing/undressing steps patient completed: Thread/unthread right underwear leg, Thread/unthread left underwear leg, Pull underwear up/down, Thread/unthread right pants leg, Thread/unthread left pants leg, Pull pants up/down, Fasten/unfasten pants, Don/Doff right sock, Don/Doff left sock, Don/Doff right shoe, Don/Doff left shoe, Fasten/unfasten right shoe, Fasten/unfasten left shoe Lower body dressing/undressing: 5: Supervision: Safety issues/verbal cues  FIM - Toileting Toileting steps completed by patient: Adjust clothing prior to toileting, Performs perineal hygiene, Adjust clothing after toileting Toileting Assistive Devices: Grab bar or rail for support Toileting: 6: More than reasonable amount of time  FIM - Diplomatic Services operational officer Devices: Grab bars Toilet Transfers: 6-To toilet/ BSC, 5-From toilet/BSC: Supervision (verbal cues/safety issues)  FIM - Banker Devices: Arm rests Bed/Chair Transfer: 7: Supine > Sit: No assist, 7: Sit > Supine: No assist, 6: Bed >  Chair or W/C: No assist, 6: Chair or W/C > Bed: No assist  FIM -  Locomotion: Wheelchair Distance: 200 Locomotion: Wheelchair: 0: Activity did not occur FIM - Locomotion: Ambulation Locomotion: Ambulation Assistive Devices: Other (comment) (no assistive device) Ambulation/Gait Assistance: 6: Modified independent (Device/Increase time) Locomotion: Ambulation: 6: Travels 150 ft or more independently/takes more than reasonable amount of time  Comprehension Comprehension Mode: Auditory Comprehension: 5-Understands complex 90% of the time/Cues < 10% of the time  Expression Expression Mode: Verbal Expression: 6-Expresses complex ideas: With extra time/assistive device  Social Interaction Social Interaction: 6-Interacts appropriately with others with medication or extra time (anti-anxiety, antidepressant).  Problem Solving Problem Solving: 5-Solves complex 90% of the time/cues < 10% of the time  Memory Memory: 5-Recognizes or recalls 90% of the time/requires cueing < 10% of the time  Medical Problem List and Plan: 1. Functional deficits secondary to debilitation after cardiac arrest status post stenting/potential anoxic encephalopathy 2. DVT Prophylaxis/Anticoagulation: Subcutaneous Lovenox. Monitor platelet counts of any signs of bleeding 3. Pain Management: Tylenol as needed 4. Acute renal insufficiency. Creatinine improved. Voiding/drinking well 5. Neuropsych: This patient is capable of making decisions on his own behalf. 6. Skin/Wound Care: Routine skin checks 7. Fluids/Electrolytes/Nutrition: encourage PO. Eating well 8. Gangrenous ischemic toes. Continued conservative care for now.   -toes improving  -still will need follow up appt with Dr. Edilia Boickson as outpt   9. MSSA pneumonia. Patient has completed course of Ancef 10. Hypertension. Norvasc 5 mg daily, Coreg 18.75 mg twice a day, lisinopril 10 mg daily, Aldactone 12.5 mg daily.   -continue same---cards to follow up as  outpt 11. Anemia: hgb improving to 9.5  LOS (Days) 9 A FACE TO FACE EVALUATION WAS PERFORMED  Chandra Asher T 02/02/2015 7:20 AM

## 2015-02-02 NOTE — Progress Notes (Addendum)
Social Work Discharge Note  The overall goal for the admission was met for:   Discharge location: Yes - home with family  Length of Stay: Yes - 9 days  Discharge activity level: Yes - modified independent  Home/community participation: Yes  Services provided included: MD, RD, PT, OT, SLP, RN, Pharmacy, Neuropsych and SW  Financial Services: Other: Medicaid pending  Follow-up services arranged: Other: Pt was given home exercise program due to no insurance for f/u.  No DME needed.  Referred pt to [facility] in [place] for f/u primary care.    Comments (or additional information):  Pt returning to his home with his dtr, twins, and wife.  Pt was referred to [facility] in [place], but family will have to f/u on this as they need to actually go by to pick up the paperwork.  Pt did not need any DME.  No f/u therapies, but home exercise program given to pt by therapists.  CSW did [place] program for pt's medications for one time assistance.  Pt to f/u in clinic to help with medications after that.  Family comfortable with plan.  Patient/Family verbalized understanding of follow-up arrangements: Yes  Individual responsible for coordination of the follow-up plan: pt with his wife and dtr for support  Confirmed correct DME delivered: Alexander Berry, Alexander Berry 02/02/2015    Gabi Mcfate, Alexander Berry

## 2015-02-02 NOTE — Progress Notes (Signed)
Pt discharged at 0935 with family. Discharge instructions given to pt and family by Deatra Inaan Angiulli, PA with verbal understanding.

## 2015-02-02 NOTE — Discharge Instructions (Signed)
Inpatient Rehab Discharge Instructions  Alexander Berry Discharge date and time: No discharge date for patient encounter.   Activities/Precautions/ Functional Status: Activity: activity as tolerated Diet: regular diet Wound Care: keep wound clean and dry Functional status:  ___ No restrictions     ___ Walk up steps independently ___ 24/7 supervision/assistance   ___ Walk up steps with assistance ___ Intermittent supervision/assistance  ___ Bathe/dress independently ___ Walk with walker     ___ Bathe/dress with assistance ___ Walk Independently    ___ Shower independently _x__ Walk with assistance    ___ Shower with assistance ___ No alcohol     ___ Return to work/school ________  COMMUNITY REFERRALS UPON DISCHARGE:   Medical Equipment/Items Ordered:  None needed Other:  [facility] for primary doctor - go pick up new packet and get him in with a doctor there               [address]                [place], [place]  [id number]                 [phone]   GENERAL COMMUNITY RESOURCES FOR PATIENT/FAMILY: MATCH program for medication assistance [facility]   [phone]  Special Instructions: Dry gauze between toes  Soak feet daily and Dial soap and pat dry   My questions have been answered and I understand these instructions. I will adhere to these goals and the provided educational materials after my discharge from the hospital.  Patient/Caregiver Signature _______________________________ Date __________  Clinician Signature _______________________________________ Date __________  Please bring this form and your medication list with you to all your follow-up doctor's appointments.

## 2015-02-19 MED FILL — Medication: Qty: 1 | Status: AC

## 2016-08-02 IMAGING — CR DG CHEST 1V PORT
1 series · 1 of 1 positions shown · non-contrast
Comparison: Portable film earlier in the day.

CLINICAL DATA: RIGHT central venous line placement.

EXAM:
PORTABLE CHEST - 1 VIEW

[AP]
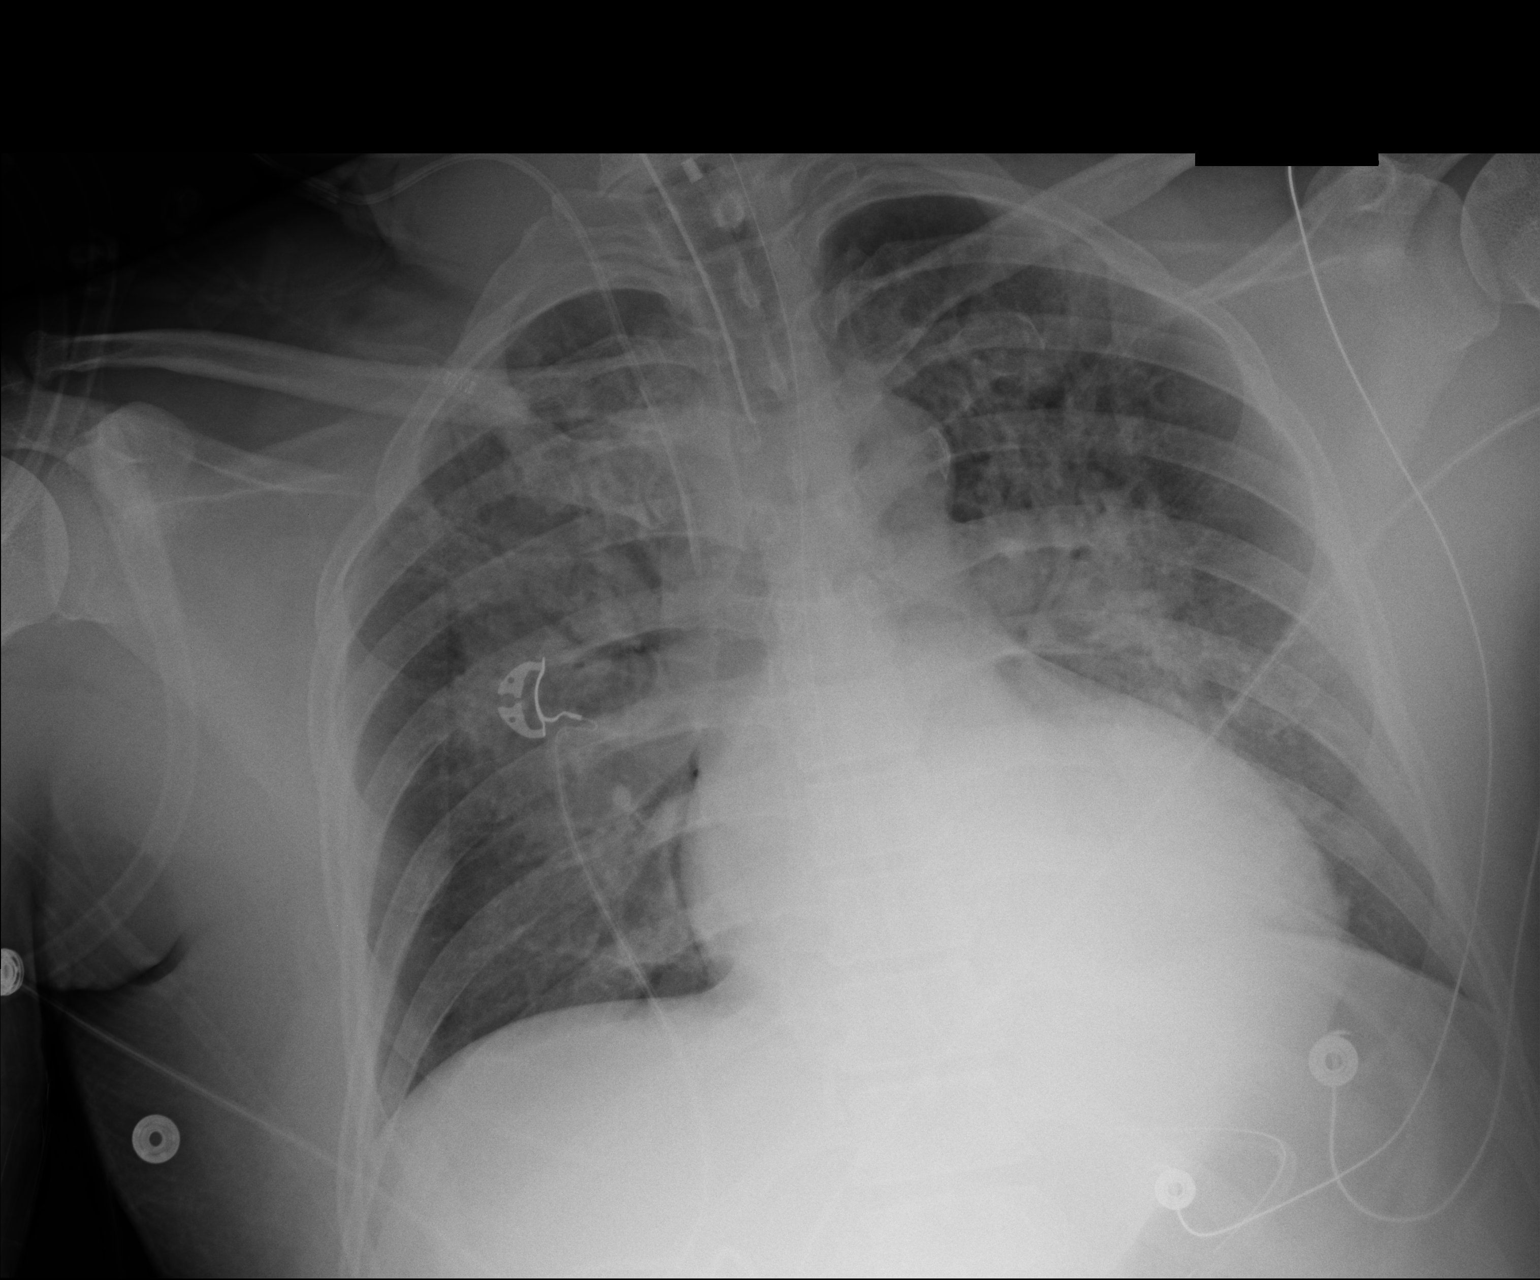

[1 of 1 positions shown; findings below may reference images not displayed]

FINDINGS: Unchanged endotracheal tube. RIGHT IJ catheter has been placed and
lies with its tip in the proximal SVC. No visible pneumothorax.
BILATERAL airspace opacities persist without significant
improvement. Retrocardiac density remains concerning for LEFT lower
lobe atelectasis or infiltrate. Orogastric tube choroidal than
stomach. Stable cardiomediastinal silhouette.
IMPRESSION: RIGHT IJ catheter proximal SVC. No pneumothorax. It is stable
airspace opacities and retrocardiac density.

## 2016-08-02 IMAGING — CR DG CHEST 1V PORT
1 series · 1 of 1 positions shown · non-contrast
Comparison: None.

CLINICAL DATA: Cardiopulmonary arrest playing soccer. Initial
encounter.

EXAM:
PORTABLE CHEST - 1 VIEW

[AP]
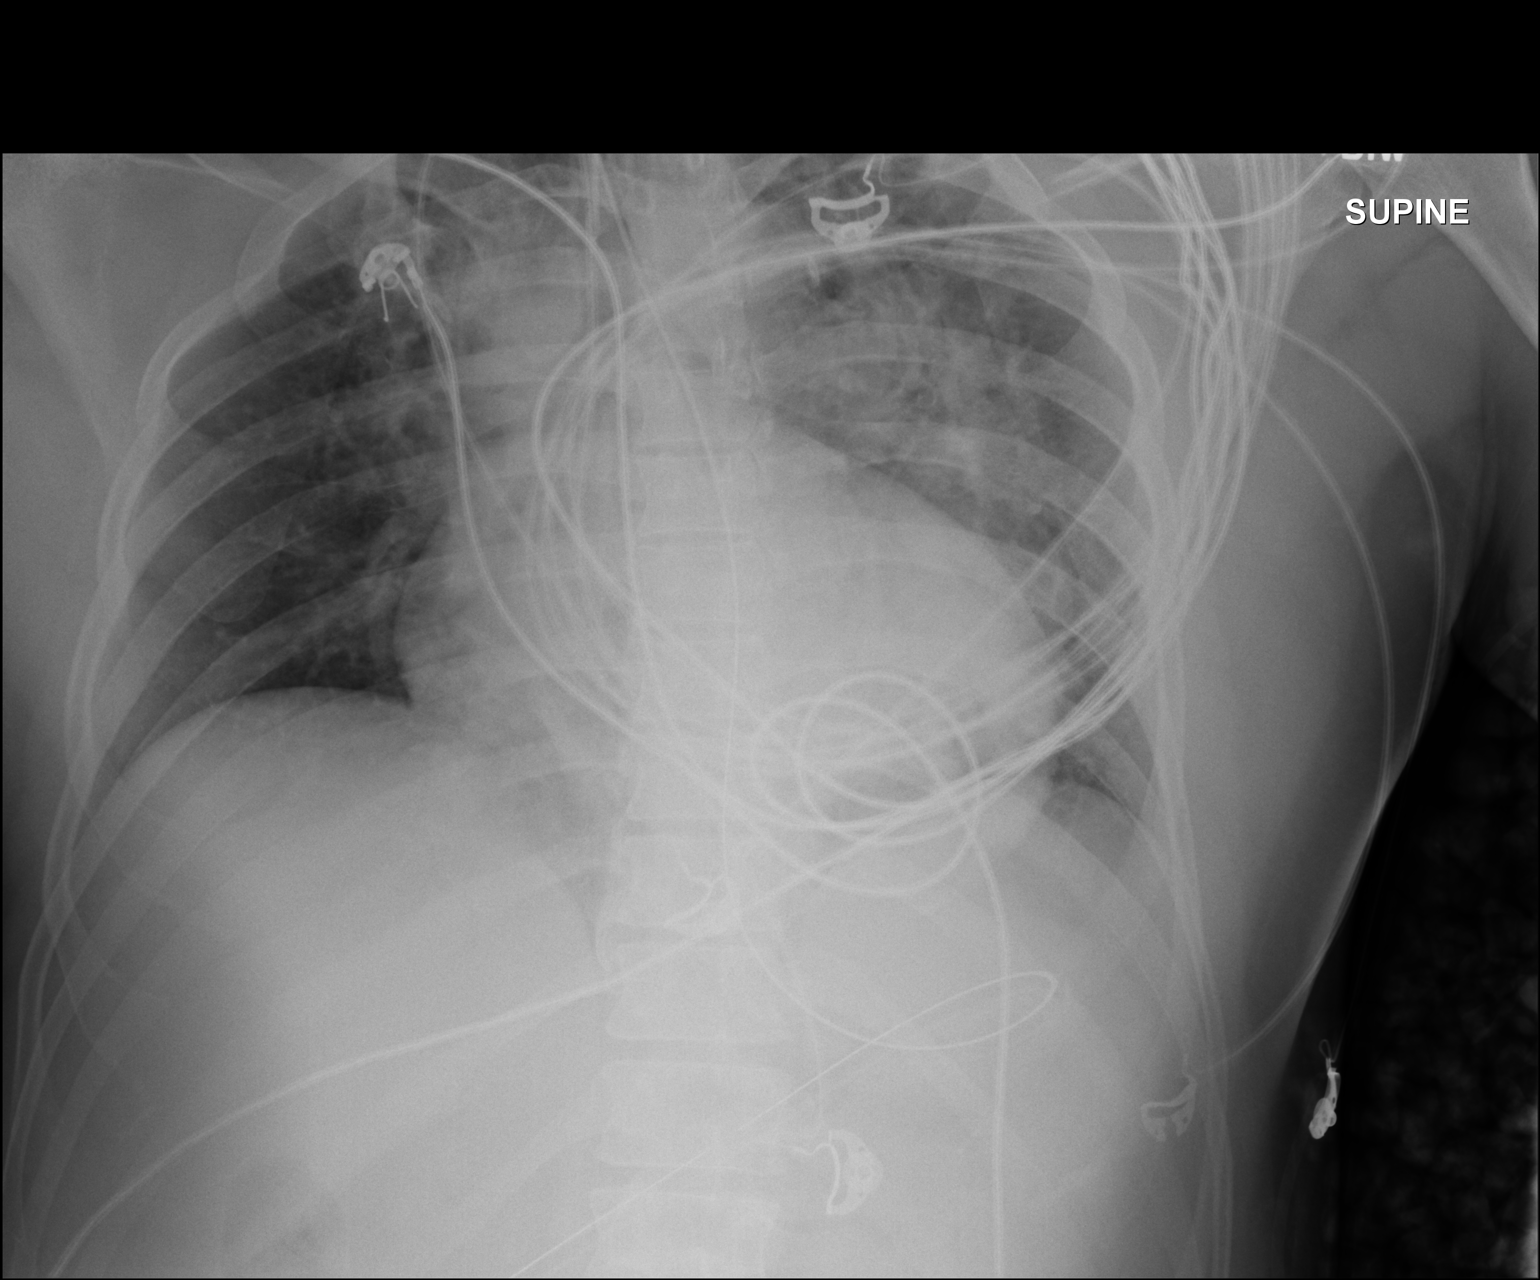

[1 of 1 positions shown; findings below may reference images not displayed]

FINDINGS: 2814 hours. Endotracheal tube tip is in the midtrachea, 3.0 cm above
the carina. A nasogastric tube extends into the stomach, tip not
visualized. No definite central venous catheter identified. There
are numerous telemetry leads overlying the chest. There are low lung
volumes with left-greater-than-right perihilar airspace opacities.
No pleural effusion or pneumothorax identified. The heart size
mediastinal contours are normal allowing for mild patient rotation.
No rib fractures identified.
IMPRESSION: Endotracheal and nasogastric tubes appears satisfactorily
positioned. Left-greater-than-right perihilar pulmonary opacities
may reflect edema or aspiration. No evidence of rib fracture or
pneumothorax.

## 2016-08-02 IMAGING — CR DG ABD PORTABLE 1V
1 series · 1 of 1 positions shown · non-contrast
Comparison: Chest radiograph same date.

CLINICAL DATA: Endotracheal and nasogastric tube placement.

EXAM:
PORTABLE ABDOMEN - 1 VIEW

[AP]
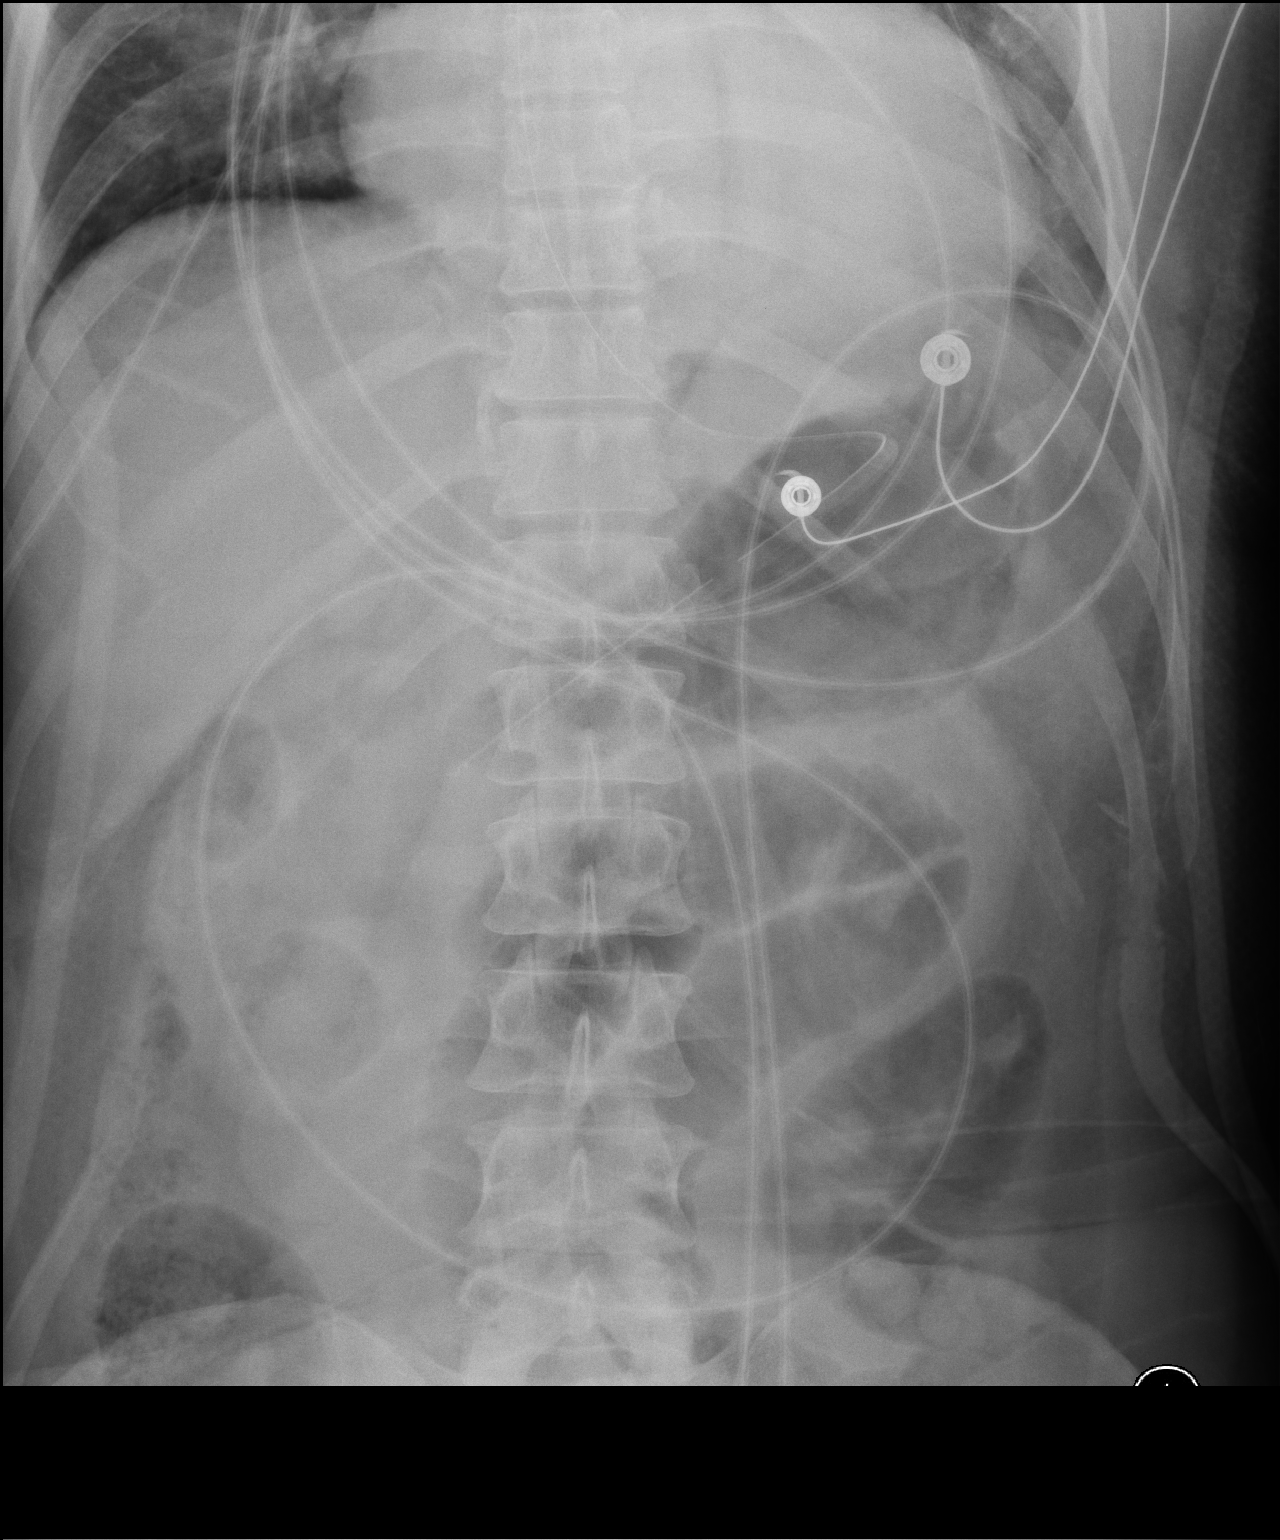

[1 of 1 positions shown; findings below may reference images not displayed]

FINDINGS: 1803 hours. Nasogastric tube extends into the distal stomach. There
are minimally prominent mid small bowel loops. There is gas within
the stomach and colon. No bowel wall thickening or supine evidence
of free intraperitoneal air. No acute osseous abnormalities
identified.
IMPRESSION: Nasogastric tube tip in the distal stomach. Mild nonspecific small
bowel dilatation.

## 2016-08-04 IMAGING — CR DG CHEST 1V PORT
1 series · 1 of 1 positions shown · non-contrast
Comparison: 01/08/2015.

CLINICAL DATA: Respiratory failure.  Shortness of breath.

EXAM:
PORTABLE CHEST - 1 VIEW

[AP]
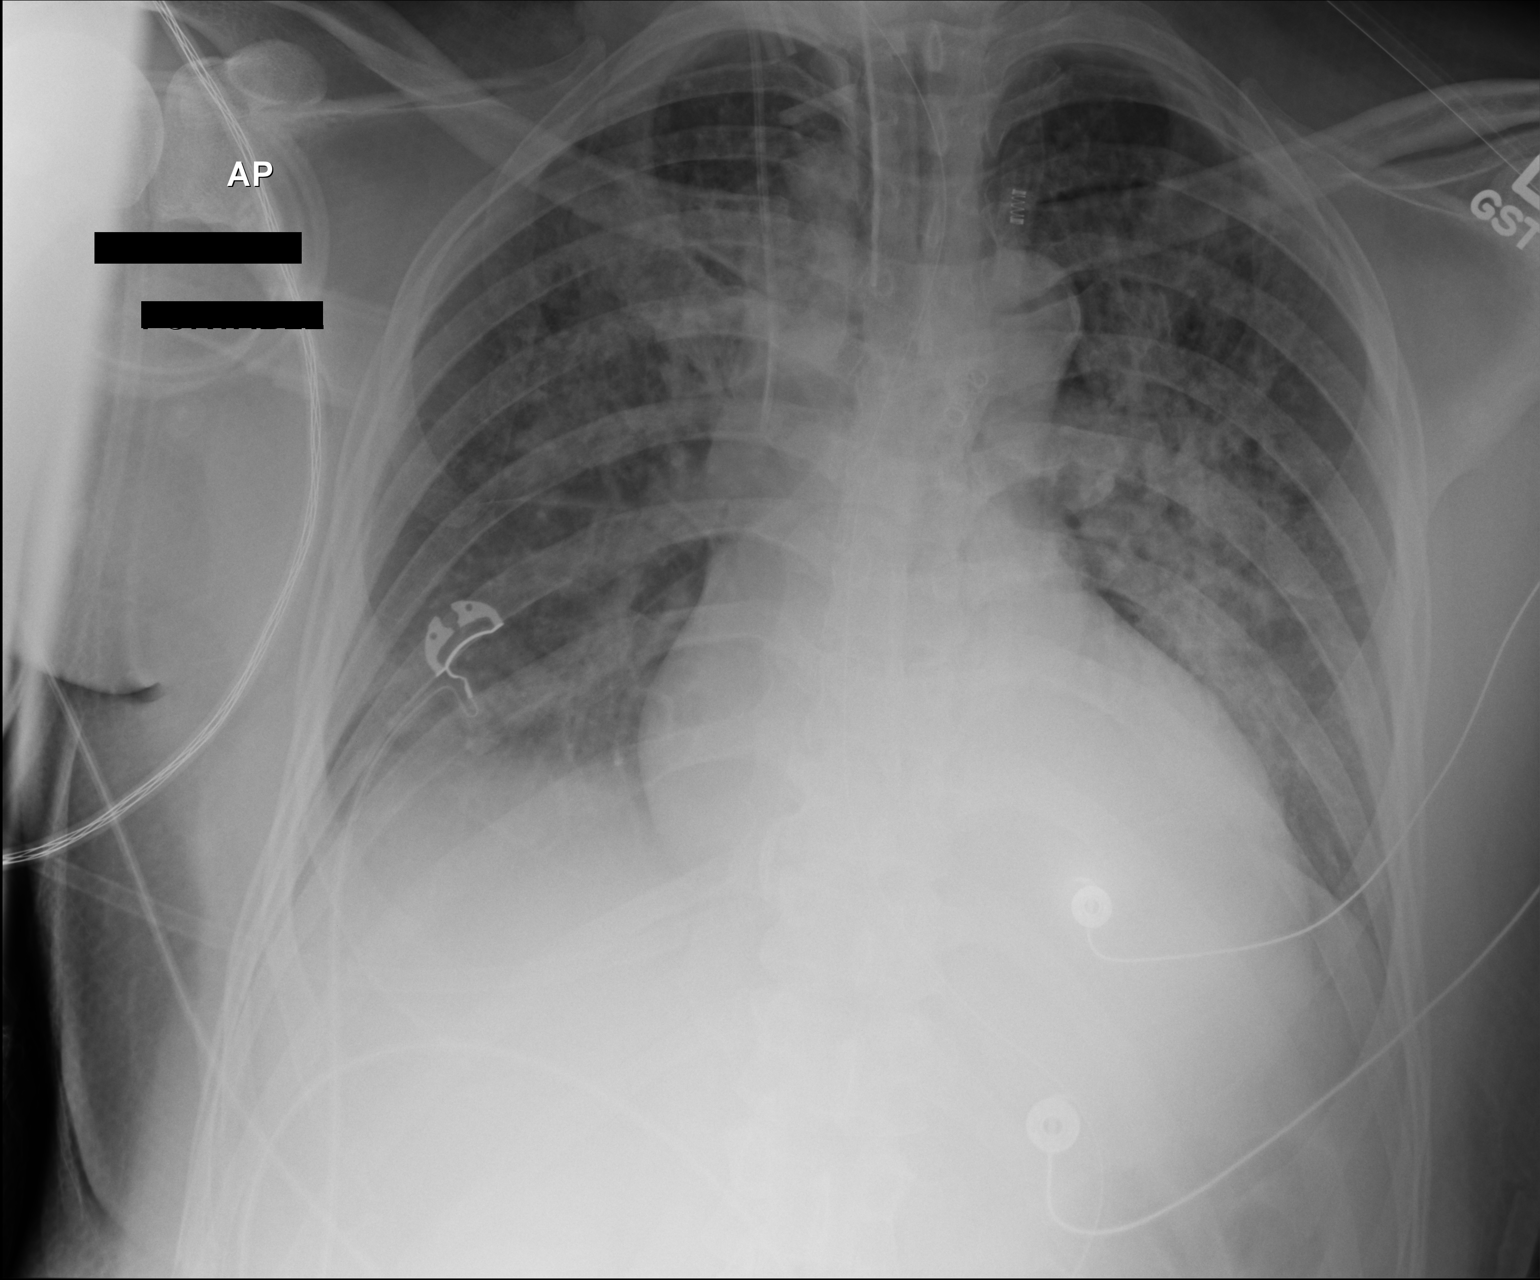

[1 of 1 positions shown; findings below may reference images not displayed]

FINDINGS: Endotracheal tube, NG tube, right IJ line in stable position. Stable
cardiomegaly. Diffuse bilateral pulmonary alveolar infiltrates are
unchanged. No pleural effusion or pneumothorax.
IMPRESSION: 1. Lines and tubes in stable position.
2. Cardiomegaly with diffuse bilateral pulmonary infiltrates
consistent with pulmonary edema and/or bilateral pneumonia. No
interim change.

## 2016-08-07 IMAGING — CR DG CHEST 1V PORT
1 series · 1 of 1 positions shown · non-contrast
Comparison: 01/12/2015

CLINICAL DATA: Respiratory failure

EXAM:
PORTABLE CHEST - 1 VIEW

[AP]
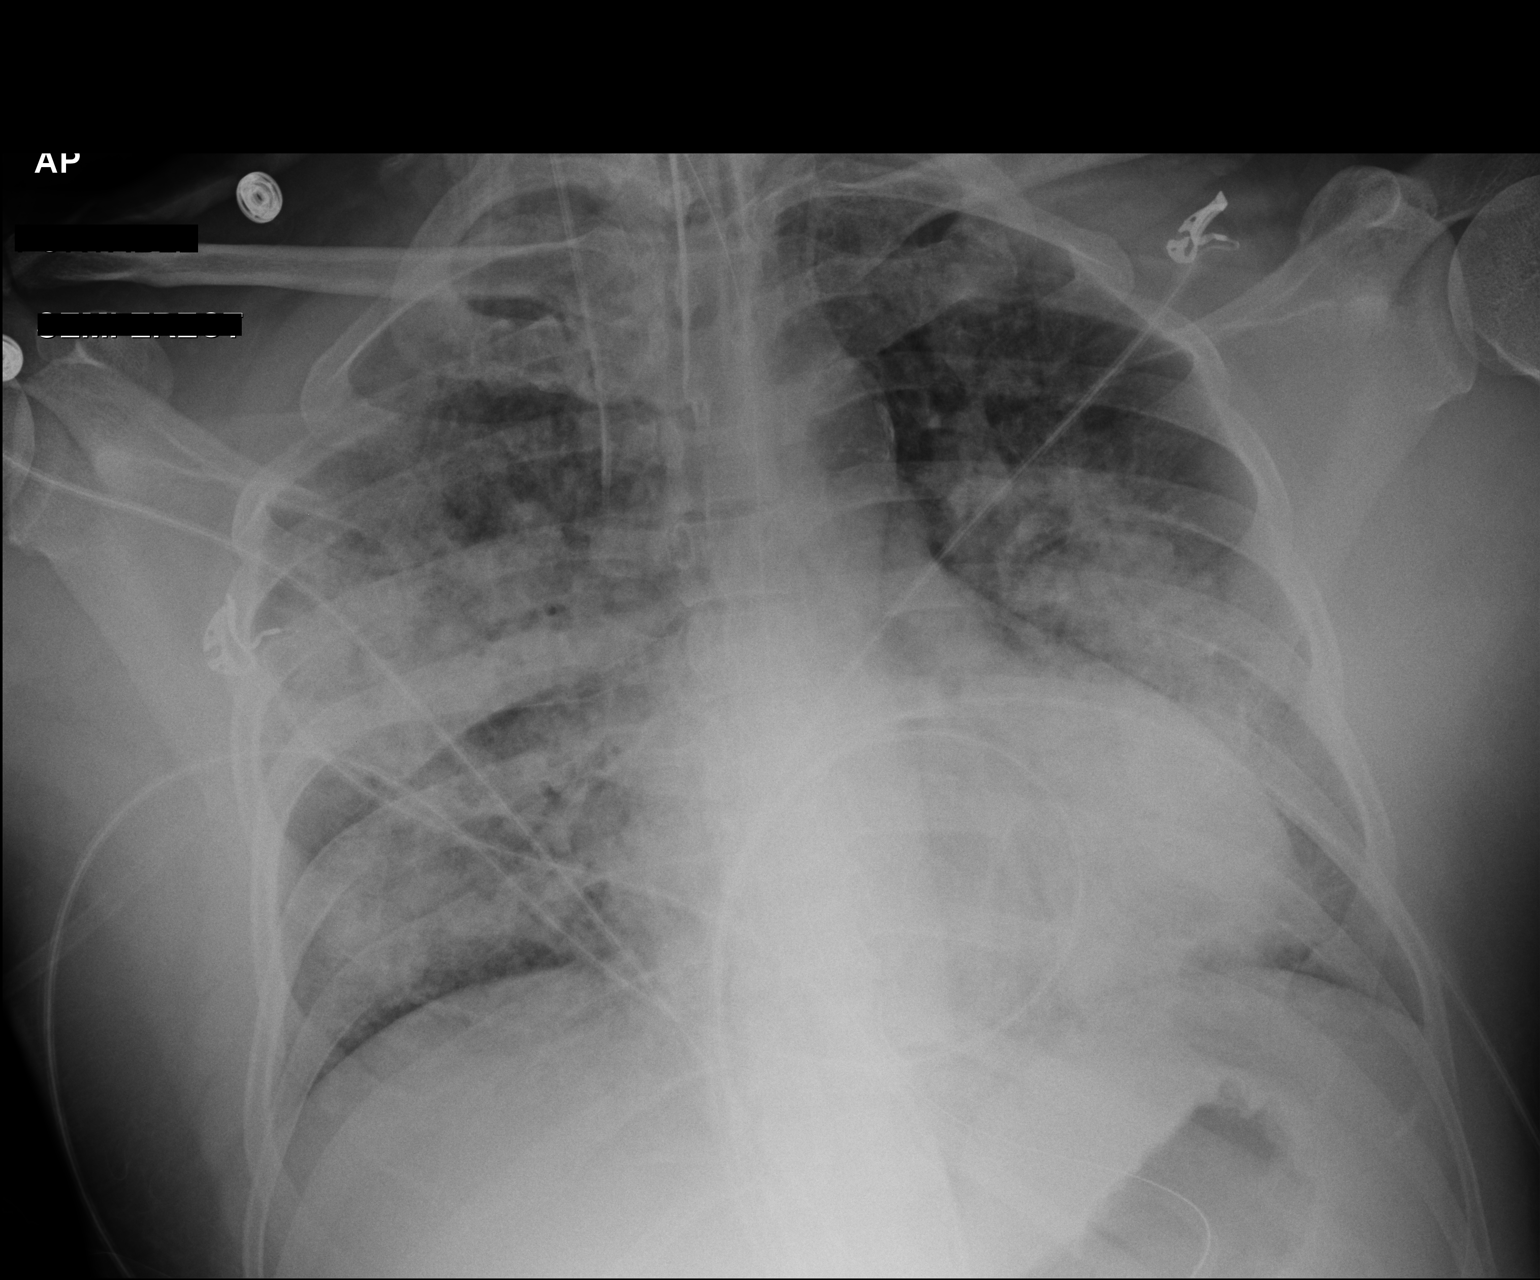

[1 of 1 positions shown; findings below may reference images not displayed]

FINDINGS: The endotracheal tube is 4.2 cm above the carina. The nasogastric
tube extends into the stomach. The right jugular central line
extends into the SVC. There is no pneumothorax. There are multifocal
airspace opacities with confluent consolidation in the right upper
lobe and left lower lobe. No large effusion is evident.
IMPRESSION: Support equipment appears satisfactorily positioned.

No significant interval change in the bilateral airspace opacities.
